# Patient Record
Sex: Male | Born: 1975 | Race: White | Hispanic: No | Smoking: Never smoker
Health system: Southern US, Community
[De-identification: ages and names within clinical notes are randomized; demographics above are authoritative.]

## PROBLEM LIST (undated history)

## (undated) DIAGNOSIS — T4145XA Adverse effect of unspecified anesthetic, initial encounter: Secondary | ICD-10-CM

## (undated) DIAGNOSIS — R0600 Dyspnea, unspecified: Secondary | ICD-10-CM

## (undated) DIAGNOSIS — N200 Calculus of kidney: Secondary | ICD-10-CM

## (undated) DIAGNOSIS — K219 Gastro-esophageal reflux disease without esophagitis: Secondary | ICD-10-CM

## (undated) DIAGNOSIS — T8859XA Other complications of anesthesia, initial encounter: Secondary | ICD-10-CM

## (undated) DIAGNOSIS — E119 Type 2 diabetes mellitus without complications: Secondary | ICD-10-CM

## (undated) DIAGNOSIS — F419 Anxiety disorder, unspecified: Secondary | ICD-10-CM

## (undated) DIAGNOSIS — G4730 Sleep apnea, unspecified: Secondary | ICD-10-CM

## (undated) DIAGNOSIS — Z87442 Personal history of urinary calculi: Secondary | ICD-10-CM

## (undated) HISTORY — PX: FINGER SURGERY: SHX640

## (undated) HISTORY — DX: Calculus of kidney: N20.0

## (undated) HISTORY — DX: Type 2 diabetes mellitus without complications: E11.9

## (undated) HISTORY — DX: Gastro-esophageal reflux disease without esophagitis: K21.9

## (undated) HISTORY — DX: Sleep apnea, unspecified: G47.30

## (undated) HISTORY — PX: ELBOW SURGERY: SHX618

## (undated) HISTORY — PX: TONSILLECTOMY: SUR1361

## (undated) HISTORY — PX: WISDOM TOOTH EXTRACTION: SHX21

---

## 1997-07-26 ENCOUNTER — Ambulatory Visit (HOSPITAL_BASED_OUTPATIENT_CLINIC_OR_DEPARTMENT_OTHER): Admission: RE | Admit: 1997-07-26 | Discharge: 1997-07-26 | Payer: Self-pay | Admitting: Orthopedic Surgery

## 2014-10-04 ENCOUNTER — Ambulatory Visit (INDEPENDENT_AMBULATORY_CARE_PROVIDER_SITE_OTHER): Payer: 59 | Admitting: Physician Assistant

## 2014-10-04 ENCOUNTER — Encounter: Payer: Self-pay | Admitting: Physician Assistant

## 2014-10-04 VITALS — BP 163/95 | HR 74 | Temp 98.0°F | Wt 314.4 lb

## 2014-10-04 DIAGNOSIS — M25561 Pain in right knee: Secondary | ICD-10-CM

## 2014-10-04 DIAGNOSIS — K219 Gastro-esophageal reflux disease without esophagitis: Secondary | ICD-10-CM | POA: Diagnosis not present

## 2014-10-04 MED ORDER — MELOXICAM 15 MG PO TABS: 15.0000 mg | ORAL_TABLET | Freq: Every day | ORAL | Status: DC

## 2014-10-04 MED ORDER — OMEPRAZOLE 40 MG PO CPDR: 40.0000 mg | DELAYED_RELEASE_CAPSULE | Freq: Every day | ORAL | Status: DC

## 2014-10-04 NOTE — Patient Instructions (Signed)

## 2014-10-04 NOTE — Progress Notes (Signed)
Subjective:     Patient ID: Ernest GaussRobert D Miller, male   DOB: 08/28/1975, 39 y.o.   MRN: [id number]  HPI Pt with a week hx of R knee pain States he was moving and carrying something up the steps when he felt a pop to the R knee Denies any locking giving way  Sx worse with standing on all day No prev surgery to knee OTC NSAIDS help sx Pt also needing rf of Prilosec for GERD Review of Systems  Constitutional: Negative.   Gastrointestinal: Negative.   Musculoskeletal: Positive for joint swelling and arthralgias.       Objective:   Physical Exam  Constitutional: He appears well-developed and well-nourished.  Abdominal: Soft. Bowel sounds are normal. He exhibits no distension and no mass. There is no tenderness. There is no rebound and no guarding.  Nursing note and vitals reviewed. No effusion to the L knee + TTP of the distal femoral and post knee + Sx with patellar compression No laxity Lachman/McMurray neg Good strength distal Pulses/sensory good distal     Assessment:     R knee pain GERD    Plan:     Will rf Prilosec  Stressed weight loss and diet for his GERD Mobic 15mg  qd- Hold OTC NSAIDS OTC Neoprene brace  Pt to f/u on BP

## 2014-10-10 ENCOUNTER — Ambulatory Visit: Payer: Self-pay | Admitting: Physician Assistant

## 2014-10-14 ENCOUNTER — Ambulatory Visit: Payer: Self-pay | Admitting: Family Medicine

## 2016-02-24 ENCOUNTER — Encounter: Payer: Self-pay | Admitting: Family Medicine

## 2016-03-02 ENCOUNTER — Encounter: Payer: Self-pay | Admitting: Family Medicine

## 2016-05-11 ENCOUNTER — Ambulatory Visit (INDEPENDENT_AMBULATORY_CARE_PROVIDER_SITE_OTHER): Payer: BLUE CROSS/BLUE SHIELD

## 2016-05-11 ENCOUNTER — Encounter: Payer: Self-pay | Admitting: Family Medicine

## 2016-05-11 ENCOUNTER — Ambulatory Visit (INDEPENDENT_AMBULATORY_CARE_PROVIDER_SITE_OTHER): Payer: BLUE CROSS/BLUE SHIELD | Admitting: Family Medicine

## 2016-05-11 VITALS — BP 152/92 | HR 77 | Temp 98.6°F | Ht 70.0 in | Wt 326.0 lb

## 2016-05-11 DIAGNOSIS — R0602 Shortness of breath: Secondary | ICD-10-CM

## 2016-05-11 DIAGNOSIS — F410 Panic disorder [episodic paroxysmal anxiety] without agoraphobia: Secondary | ICD-10-CM | POA: Diagnosis not present

## 2016-05-11 MED ORDER — ESCITALOPRAM OXALATE 10 MG PO TABS: 10.0000 mg | ORAL_TABLET | Freq: Every day | ORAL | 2 refills | Status: DC

## 2016-05-11 MED ORDER — OMEPRAZOLE 40 MG PO CPDR: 40.0000 mg | DELAYED_RELEASE_CAPSULE | Freq: Every day | ORAL | 3 refills | Status: DC
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
## 2016-05-12 ENCOUNTER — Telehealth: Payer: Self-pay | Admitting: *Deleted

## 2016-05-12 LAB — CMP14+EGFR
ALT: 26 IU/L (ref 0–44)
AST: 22 IU/L (ref 0–40)
Albumin/Globulin Ratio: 1.5 (ref 1.2–2.2)
Albumin: 4.6 g/dL (ref 3.5–5.5)
Alkaline Phosphatase: 73 IU/L (ref 39–117)
BUN/Creatinine Ratio: 15 (ref 9–20)
BUN: 18 mg/dL (ref 6–24)
Bilirubin Total: <0.2 mg/dL (ref 0.0–1.2)
CO2: 25 mmol/L (ref 18–29)
Calcium: 9.4 mg/dL (ref 8.7–10.2)
Chloride: 99 mmol/L (ref 96–106)
Creatinine, Ser: 1.2 mg/dL (ref 0.76–1.27)
GFR calc Af Amer: 86 mL/min/{1.73_m2} (ref >59)
GFR calc non Af Amer: 74 mL/min/{1.73_m2} (ref >59)
Globulin, Total: 3.1 g/dL (ref 1.5–4.5)
Glucose: 82 mg/dL (ref 65–99)
Potassium: 4.9 mmol/L (ref 3.5–5.2)
Sodium: 140 mmol/L (ref 134–144)
Total Protein: 7.7 g/dL (ref 6.0–8.5)

## 2016-05-12 LAB — CBC WITH DIFFERENTIAL/PLATELET
Basophils Absolute: 0.1 10*3/uL (ref 0.0–0.2)
Basos: 1 %
EOS (ABSOLUTE): 0.8 10*3/uL — ABNORMAL HIGH (ref 0.0–0.4)
Eos: 6 %
Hematocrit: 44.2 % (ref 37.5–51.0)
Hemoglobin: 15.6 g/dL (ref 13.0–17.7)
Immature Grans (Abs): 0 10*3/uL (ref 0.0–0.1)
Immature Granulocytes: 0 %
Lymphocytes Absolute: 4 10*3/uL — ABNORMAL HIGH (ref 0.7–3.1)
Lymphs: 31 %
MCH: 30.6 pg (ref 26.6–33.0)
MCHC: 35.3 g/dL (ref 31.5–35.7)
MCV: 87 fL (ref 79–97)
Monocytes Absolute: 1.1 10*3/uL — ABNORMAL HIGH (ref 0.1–0.9)
Monocytes: 8 %
Neutrophils Absolute: 7 10*3/uL (ref 1.4–7.0)
Neutrophils: 54 %
Platelets: 316 10*3/uL (ref 150–379)
RBC: 5.1 x10E6/uL (ref 4.14–5.80)
RDW: 13.8 % (ref 12.3–15.4)
WBC: 12.9 10*3/uL — ABNORMAL HIGH (ref 3.4–10.8)

## 2016-05-12 LAB — BRAIN NATRIURETIC PEPTIDE: BNP: 13 pg/mL (ref 0.0–100.0)

## 2016-05-12 LAB — D-DIMER, QUANTITATIVE: D-DIMER: 0.37 mg/L FEU (ref 0.00–0.49)

## 2016-05-12 LAB — TSH+FREE T4
Free T4: 1.14 ng/dL (ref 0.82–1.77)
TSH: 2.11 u[IU]/mL (ref 0.450–4.500)

## 2016-05-12 NOTE — Progress Notes (Signed)
Your chest x-ray looked normal. Thanks, WS.

## 2016-05-12 NOTE — Telephone Encounter (Signed)
-----   Message from Mechele ClaudeWarren Stacks, MD sent at 05/12/2016  4:15 PM EST ----- Your chest x-ray looked normal. Thanks, WS.

## 2016-05-12 NOTE — Telephone Encounter (Signed)
-----   Message from Mechele ClaudeWarren Stacks, MD sent at 05/12/2016  4:16 PM EST ----- Aniceto BossHello Celedonio, Your blood work showed a mild elevation of your blood count. It's consistent with a mild infection, probably viral. Best Regards, Mechele ClaudeWarren Stacks, M.D.

## 2016-05-12 NOTE — Telephone Encounter (Signed)
Pt notified of results Verbalizes understanding 

## 2016-05-25 ENCOUNTER — Ambulatory Visit (INDEPENDENT_AMBULATORY_CARE_PROVIDER_SITE_OTHER): Payer: BLUE CROSS/BLUE SHIELD | Admitting: Family Medicine

## 2016-05-25 ENCOUNTER — Encounter: Payer: Self-pay | Admitting: Family Medicine

## 2016-05-25 VITALS — BP 143/89 | HR 61 | Temp 97.3°F | Ht 70.0 in | Wt 323.2 lb

## 2016-05-25 DIAGNOSIS — R0602 Shortness of breath: Secondary | ICD-10-CM | POA: Diagnosis not present

## 2016-05-25 DIAGNOSIS — F411 Generalized anxiety disorder: Secondary | ICD-10-CM | POA: Diagnosis not present
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
## 2016-06-17 ENCOUNTER — Other Ambulatory Visit: Payer: Self-pay | Admitting: *Deleted

## 2016-06-17 MED ORDER — ESCITALOPRAM OXALATE 10 MG PO TABS: 10.0000 mg | ORAL_TABLET | Freq: Every day | ORAL | 0 refills | Status: DC

## 2016-08-10 ENCOUNTER — Ambulatory Visit: Payer: BLUE CROSS/BLUE SHIELD | Admitting: Pediatrics

## 2016-08-23 ENCOUNTER — Ambulatory Visit: Payer: BLUE CROSS/BLUE SHIELD | Admitting: Family Medicine

## 2016-08-25 ENCOUNTER — Encounter: Payer: Self-pay | Admitting: Pediatrics

## 2016-08-25 ENCOUNTER — Ambulatory Visit (INDEPENDENT_AMBULATORY_CARE_PROVIDER_SITE_OTHER): Payer: BLUE CROSS/BLUE SHIELD | Admitting: Pediatrics

## 2016-08-25 VITALS — BP 127/77 | HR 75 | Temp 98.1°F | Ht 70.0 in | Wt 319.0 lb

## 2016-08-25 DIAGNOSIS — F411 Generalized anxiety disorder: Secondary | ICD-10-CM

## 2016-08-25 DIAGNOSIS — R1013 Epigastric pain: Secondary | ICD-10-CM | POA: Diagnosis not present

## 2016-08-25 DIAGNOSIS — R0683 Snoring: Secondary | ICD-10-CM

## 2016-08-25 MED ORDER — VENLAFAXINE HCL ER 37.5 MG PO CP24
75.0000 mg | ORAL_CAPSULE | Freq: Every day | ORAL | 3 refills | Status: DC
Start: 2016-08-25 — End: 2016-10-27

## 2016-08-25 NOTE — Progress Notes (Signed)
  Subjective:   Patient ID: Ernest Miller, male    DOB: 03/19/1976, 41 y.o.   MRN: [id number] CC: Follow-up (Lexapro, not working)  HPI: Ernest Miller is a 41 y.o. male presenting for Follow-up (Lexapro, not working)  Started on [employer] 3 months ago Increased to 20mg  one week ago Feels on edge all the time hasnt done anything to hurt anyone Filter is gone  Mood was much better at first when started on [employer] Sleeping ok, wife thinks he is more restless Has been tried on zoloft and prozac Thinks it made him emotional, was crying a lot while on it  Snores loudly Has pauses with breathing Has never been diagnosed with sleep apnea or had eval  8 yrs ago had gallbladder "attack" Emesis some mornings  Says he was told at the time he should have his gallbladder out Didn't follow up, symptoms resolved Taking PPI in the mornings, helps some Symptoms always worse after a spicy meal Has some symptoms a couple days a week   GAD 7 : Generalized Anxiety Score 08/25/2016  Nervous, Anxious, on Edge 3  Control/stop worrying 0  Worry too much - different things 0  Trouble relaxing 3  Restless 1  Easily annoyed or irritable 3  Afraid - awful might happen 0  Total GAD 7 Score 10  Anxiety Difficulty Somewhat difficult     Relevant past medical, surgical, family and social history reviewed. Allergies and medications reviewed and updated. History  Smoking Status  . Never Smoker  Smokeless Tobacco  . Never Used   ROS: Per HPI   Objective:    BP 127/77   Pulse 75   Temp 98.1 F (36.7 C) (Oral)   Ht 5\' 10"  (1.778 m)   Wt (!) 319 lb (144.7 kg)   BMI 45.77 kg/m   Wt Readings from Last 3 Encounters:  08/25/16 (!) 319 lb (144.7 kg)  05/25/16 (!) 323 lb 3.2 oz (146.6 kg)  05/11/16 (!) 326 lb (147.9 kg)    Gen: NAD, alert, cooperative with exam, NCAT EYES: EOMI, no conjunctival injection, or no icterus ENT:  TMs pearly gray b/l, OP without erythema, +crowding LYMPH: no cervical  LAD CV: NRRR, normal S1/S2, no murmur, distal pulses 2+ b/l Resp: CTABL, no wheezes, normal WOB Abd: +BS, soft, mildly tender epigastric area with palpation, ND. Obese, no guarding or rebound Ext: No edema, warm Neuro: Alert and oriented, strength equal b/l UE and LE, coordination grossly normal MSK: normal muscle bulk Psych: nl affect, no thoughts of self harm  Assessment & Plan:  Ernest Miller was seen today for follow-up multiple med problems.  Diagnoses and all orders for this visit:  GAD (generalized anxiety disorder) Ongoing symptoms, worsened with lexapro. taper off of lexapro, start below -     venlafaxine XR (EFFEXOR-XR) 37.5 MG 24 hr capsule; Take 2 capsules (75 mg total) by mouth daily with breakfast.  Epigastric pain On going off and on for months H/o gall stones, no RUQ pain, some epigastric tenderness with palpation Normal LFTs 3 mo ago Start with u/s, increase PPI to BID Avoid exacerbating foods -     US Abdomen Limited RUQ; Future  Snoring Pauses with breathing, daytime fatigue Refer to sleep apnea eval -     Ambulatory referral to Neurology   Follow up plan: Return in about 2 months (around 10/25/2016). Ernest Krasarol Jalila Goodnough, MD [facility]

## 2016-09-03 ENCOUNTER — Ambulatory Visit (HOSPITAL_COMMUNITY): Payer: Self-pay

## 2016-09-13 ENCOUNTER — Ambulatory Visit (HOSPITAL_COMMUNITY): Admission: RE | Admit: 2016-09-13 | Payer: BLUE CROSS/BLUE SHIELD | Source: Ambulatory Visit

## 2016-10-07 ENCOUNTER — Institutional Professional Consult (permissible substitution): Payer: BLUE CROSS/BLUE SHIELD | Admitting: Neurology

## 2016-10-12 ENCOUNTER — Ambulatory Visit (HOSPITAL_COMMUNITY): Payer: Self-pay

## 2016-10-21 ENCOUNTER — Other Ambulatory Visit: Payer: Self-pay | Admitting: Family Medicine

## 2016-10-27 ENCOUNTER — Encounter: Payer: Self-pay | Admitting: Pediatrics

## 2016-10-27 ENCOUNTER — Ambulatory Visit (INDEPENDENT_AMBULATORY_CARE_PROVIDER_SITE_OTHER): Payer: BLUE CROSS/BLUE SHIELD | Admitting: Pediatrics

## 2016-10-27 VITALS — BP 138/89 | HR 72 | Temp 98.0°F | Ht 70.0 in | Wt 325.0 lb

## 2016-10-27 DIAGNOSIS — F419 Anxiety disorder, unspecified: Secondary | ICD-10-CM

## 2016-10-27 DIAGNOSIS — R0683 Snoring: Secondary | ICD-10-CM | POA: Diagnosis not present

## 2016-10-27 DIAGNOSIS — K219 Gastro-esophageal reflux disease without esophagitis: Secondary | ICD-10-CM

## 2016-10-27 DIAGNOSIS — Z6841 Body Mass Index (BMI) 40.0 and over, adult: Secondary | ICD-10-CM | POA: Diagnosis not present

## 2016-10-27 MED ORDER — ESCITALOPRAM OXALATE 20 MG PO TABS: 20.0000 mg | ORAL_TABLET | Freq: Every day | ORAL | 1 refills | Status: DC

## 2016-10-27 NOTE — Progress Notes (Signed)
  Subjective:   Patient ID: Ernest Miller, male    DOB: 11/08/1975, 41 y.o.   MRN: [id number] CC: Follow-up (2 month) med problems HPI: Ernest Miller is a 41 y.o. male presenting for Follow-up (2 month)  GAD: tried venlafaxine, was not able to sleep Tried for about a week Went back to lexapro 10mg  Anxiety is doing ok  Rash: started about a week ago after being outside and doing yard work in shorts On legs mostly, also UE, a few on his back A couple of new places past couple of days Very itchy  Skin tags: wants to have removed Have had frozen in the past, in inner thighs  Still with some diarrhea after eating Was not able to afford the copay  Was scheduled to have eval for sleep apnea, wanted $400 copay, not able to do at this time  No chest pain, no SOB  Relevant past medical, surgical, family and social history reviewed. Allergies and medications reviewed and updated. History  Smoking Status  . Never Smoker  Smokeless Tobacco  . Never Used   ROS: Per HPI   Objective:    BP 138/89   Pulse 72   Temp 98 F (36.7 C) (Oral)   Ht 5\' 10"  (1.778 m)   Wt (!) 325 lb (147.4 kg)   BMI 46.63 kg/m   Wt Readings from Last 3 Encounters:  10/27/16 (!) 325 lb (147.4 kg)  08/25/16 (!) 319 lb (144.7 kg)  05/25/16 (!) 323 lb 3.2 oz (146.6 kg)    Gen: NAD, alert, cooperative with exam, NCAT EYES: EOMI, no conjunctival injection, or no icterus ENT:   no cervical LAD CV: NRRR, normal S1/S2, no murmur, distal pulses 2+ b/l Resp: CTABL, no wheezes, normal WOB Abd: +BS, soft, NTND. no guarding or organomegaly Ext: No edema, warm Neuro: Alert and oriented MSK: normal muscle bulk Skin: lower legs, trunk, with a few scattered red papules < 2mm, with excoriations No rash on hands  Assessment & Plan:  Ernest Miller was seen today for follow-up multiple med problems  Diagnoses and all orders for this visit:  Anxiety Ongoing symptoms Cont lexapro, increase to 20mg  -     escitalopram  (LEXAPRO) 20 MG tablet; Take 1 tablet (20 mg total) by mouth daily.  Snoring Not able to afford sleep eval now Will cont to work on weight loss  Gastroesophageal reflux disease, esophagitis presence not specified Cont omeprazole Avoid exacerbating foods  BMI 45.0-49.9, adult (HCC) Discussed lifestyle changes, inc physical activity, decrease fast food intake  Follow up plan: Return in about 3 months (around 01/27/2017) for med follow up. Rex Krasarol Arpita Fentress, MD [facility]

## 2016-11-03 ENCOUNTER — Ambulatory Visit: Payer: BLUE CROSS/BLUE SHIELD | Admitting: Pediatrics

## 2016-11-05 ENCOUNTER — Ambulatory Visit: Payer: BLUE CROSS/BLUE SHIELD | Admitting: Pediatrics

## 2017-01-27 ENCOUNTER — Encounter: Payer: Self-pay | Admitting: Pediatrics

## 2017-01-27 ENCOUNTER — Ambulatory Visit (INDEPENDENT_AMBULATORY_CARE_PROVIDER_SITE_OTHER): Payer: BLUE CROSS/BLUE SHIELD | Admitting: Pediatrics

## 2017-01-27 VITALS — BP 137/88 | HR 76 | Temp 97.9°F | Ht 70.0 in | Wt 334.4 lb

## 2017-01-27 DIAGNOSIS — R0683 Snoring: Secondary | ICD-10-CM | POA: Diagnosis not present

## 2017-01-27 DIAGNOSIS — Z131 Encounter for screening for diabetes mellitus: Secondary | ICD-10-CM

## 2017-01-27 DIAGNOSIS — R030 Elevated blood-pressure reading, without diagnosis of hypertension: Secondary | ICD-10-CM | POA: Diagnosis not present

## 2017-01-27 DIAGNOSIS — Z13220 Encounter for screening for lipoid disorders: Secondary | ICD-10-CM

## 2017-01-27 DIAGNOSIS — E119 Type 2 diabetes mellitus without complications: Secondary | ICD-10-CM | POA: Diagnosis not present

## 2017-01-27 DIAGNOSIS — F419 Anxiety disorder, unspecified: Secondary | ICD-10-CM

## 2017-01-27 DIAGNOSIS — Z6841 Body Mass Index (BMI) 40.0 and over, adult: Secondary | ICD-10-CM

## 2017-01-27 LAB — BAYER DCA HB A1C WAIVED: HB A1C (BAYER DCA - WAIVED): 6.5 % (ref <7.0)

## 2017-01-27 MED ORDER — ESCITALOPRAM OXALATE 20 MG PO TABS: 20.0000 mg | ORAL_TABLET | Freq: Every day | ORAL | 1 refills | Status: DC

## 2017-01-27 NOTE — Progress Notes (Signed)
  Subjective:   Patient ID: Ernest Miller, male    DOB: May 05, 1975, 41 y.o.   MRN: [id number] CC: Follow-up (3 month) multiple med problems  HPI: Ernest Miller is a 41 y.o. male presenting for Follow-up (3 month)  Anxiety: lexapro has been working, says mood is ok as well  Arthritis: pain in his R knee when he has been on it for periods of time Up and down bending at work a lot  Elevated BMI: not eating after 7pm Drinking one soda a day Drinks 16oz of milk a day  Snoring: not yet evaluated for sleep apnea   Relevant past medical, surgical, family and social history reviewed. Allergies and medications reviewed and updated. History  Smoking Status  . Never Smoker  Smokeless Tobacco  . Never Used   ROS: Per HPI   Objective:    BP 137/88   Pulse 76   Temp 97.9 F (36.6 C) (Oral)   Ht '5\' 10"'$  (1.778 m)   Wt (!) 334 lb 6.4 oz (151.7 kg)   BMI 47.98 kg/m   Wt Readings from Last 3 Encounters:  01/27/17 (!) 334 lb 6.4 oz (151.7 kg)  10/27/16 (!) 325 lb (147.4 kg)  08/25/16 (!) 319 lb (144.7 kg)    Gen: NAD, alert, obese, cooperative with exam, NCAT EYES: EOMI, no conjunctival injection, or no icterus ENT:   OP without erythema LYMPH: no cervical LAD CV: NRRR, normal S1/S2, no murmur, distal pulses 2+ b/l Resp: CTABL, no wheezes, normal WOB Abd: +BS, soft, NTND. Ext: No edema, warm Neuro: Alert and oriented  Assessment & Plan:  Anders was seen today for follow-up med problems  Diagnoses and all orders for this visit:  Snoring refer for sleep apnea -     Ambulatory referral to Cardiology  Anxiety Symptoms improved with below, mood he says is fine, continue below -     escitalopram (LEXAPRO) 20 MG tablet; Take 1 tablet (20 mg total) by mouth daily.  Screening for diabetes mellitus -     Bayer DCA Hb A1c Waived  Screening for hyperlipidemia -     Lipid panel  Elevated blood pressure reading -     CMP14+EGFR  BMI 45.0-49.9, adult (HCC) Decrease sugar  intake  Diabetes mellitus without complication (HCC) O8L 6.5, start metformin, cont diet, physical activity and wt loss discussion -     metFORMIN (GLUCOPHAGE) 500 MG tablet; Take 1 tablet (500 mg total) by mouth 2 (two) times daily with a meal.   Follow up plan: Return in about 3 months (around 04/29/2017). Assunta Found, MD [place]

## 2017-01-28 LAB — CMP14+EGFR
ALT: 29 IU/L (ref 0–44)
AST: 24 IU/L (ref 0–40)
Albumin/Globulin Ratio: 1.6 (ref 1.2–2.2)
Albumin: 4.6 g/dL (ref 3.5–5.5)
Alkaline Phosphatase: 72 IU/L (ref 39–117)
BUN/Creatinine Ratio: 18 (ref 9–20)
BUN: 20 mg/dL (ref 6–24)
Bilirubin Total: 0.2 mg/dL (ref 0.0–1.2)
CO2: 27 mmol/L (ref 20–29)
Calcium: 9.9 mg/dL (ref 8.7–10.2)
Chloride: 101 mmol/L (ref 96–106)
Creatinine, Ser: 1.11 mg/dL (ref 0.76–1.27)
GFR calc Af Amer: 94 mL/min/{1.73_m2} (ref >59)
GFR calc non Af Amer: 81 mL/min/{1.73_m2} (ref >59)
Globulin, Total: 2.9 g/dL (ref 1.5–4.5)
Glucose: 121 mg/dL — ABNORMAL HIGH (ref 65–99)
Potassium: 5.4 mmol/L — ABNORMAL HIGH (ref 3.5–5.2)
Sodium: 142 mmol/L (ref 134–144)
Total Protein: 7.5 g/dL (ref 6.0–8.5)

## 2017-01-28 LAB — LIPID PANEL
Chol/HDL Ratio: 6.2 ratio — ABNORMAL HIGH (ref 0.0–5.0)
Cholesterol, Total: 204 mg/dL — ABNORMAL HIGH (ref 100–199)
HDL: 33 mg/dL — ABNORMAL LOW (ref >39)
LDL Calculated: 130 mg/dL — ABNORMAL HIGH (ref 0–99)
Triglycerides: 205 mg/dL — ABNORMAL HIGH (ref 0–149)
VLDL Cholesterol Cal: 41 mg/dL — ABNORMAL HIGH (ref 5–40)

## 2017-01-29 MED ORDER — METFORMIN HCL 500 MG PO TABS: 500.0000 mg | ORAL_TABLET | Freq: Two times a day (BID) | ORAL | 3 refills | Status: DC

## 2017-01-31 ENCOUNTER — Telehealth: Payer: Self-pay | Admitting: *Deleted

## 2017-01-31 MED ORDER — PRAVASTATIN SODIUM 40 MG PO TABS: 40.0000 mg | ORAL_TABLET | Freq: Every day | ORAL | 3 refills | Status: DC

## 2017-01-31 NOTE — Telephone Encounter (Signed)
Patient's wife  aware of patient's lab results.  A1c  in prediabetic range and cholesterol is elevated.  Metformin 500 mg sent to pharmacy, Patient is to take 1/2 tablet twice daily.  Pravastatin 40 mg also sent to pharmacy and patient is to take 1 at night.  Per Dr. Oswaldo DoneVincent.  2 week f/u appt made

## 2017-02-16 ENCOUNTER — Ambulatory Visit: Payer: BLUE CROSS/BLUE SHIELD | Admitting: Pediatrics

## 2017-02-16 ENCOUNTER — Encounter: Payer: Self-pay | Admitting: Pediatrics

## 2017-02-16 VITALS — BP 137/89 | HR 82 | Temp 97.2°F | Ht 70.0 in | Wt 336.0 lb

## 2017-02-16 DIAGNOSIS — Z6841 Body Mass Index (BMI) 40.0 and over, adult: Secondary | ICD-10-CM

## 2017-02-16 DIAGNOSIS — E785 Hyperlipidemia, unspecified: Secondary | ICD-10-CM

## 2017-02-16 DIAGNOSIS — E119 Type 2 diabetes mellitus without complications: Secondary | ICD-10-CM | POA: Diagnosis not present

## 2017-02-16 DIAGNOSIS — E1169 Type 2 diabetes mellitus with other specified complication: Secondary | ICD-10-CM

## 2017-02-16 NOTE — Progress Notes (Signed)
  Subjective:   Patient ID: Jena GaussRobert D Vanduyn, male    DOB: 12/31/1975, 41 y.o.   MRN: [id number] CC: Follow-up (2 week) Diabetes, new diagnosis HPI: Jena GaussRobert D Aarons is a 41 y.o. 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 Allergies and medications reviewed and updated. Social History   Tobacco Use  Smoking Status Never Smoker  Smokeless Tobacco Never Used   ROS: Per HPI   Objective:    BP 137/89   Pulse 82   Temp (!) 97.2 F (36.2 C) (Oral)   Ht 5\' 10"  (1.778 m)   Wt (!) 336 lb (152.4 kg)   BMI 48.21 kg/m   Wt Readings from Last 3 Encounters:  02/16/17 (!) 336 lb (152.4 kg)  01/27/17 (!) 334 lb 6.4 oz (151.7 kg)  10/27/16 (!) 325 lb (147.4 kg)    Gen: NAD, alert, cooperative with exam, NCAT EYES: EOMI, no conjunctival injection, or no icterus ENT:  MMM CV: NRRR, normal S1/S2, no murmur, distal pulses 2+ b/l Resp: CTABL, no wheezes, normal WOB Abd: +BS, soft, NTND. Ext: No edema, warm Neuro: Alert and oriented, strength equal b/l UE and LE, coordination grossly normal, sensation intact bilateral feet  Assessment & Plan:  [name] was seen today for follow-up diagnosis diabetes  Diagnoses and all orders for this visit:  Diabetes mellitus without complication (HCC) A1c 6.5 Start metformin Discussed dietary changes,  stop frosting, sodas Increase fruit and vegetable intake Increase physical activity Due for eye exam, patient aware -     Microalbumin / creatinine urine ratio  Hyperlipidemia associated with type 2 diabetes mellitus (HCC) Stable, on statin tolerating well  Class 3 severe obesity due to excess calories with serious comorbidity and body mass index (BMI) of 45.0 to 49.9 in adult (HCC) Goal of 5 pounds decrease over the next 3 months Lifestyle changes as above  Hypertension Blood pressure remains slightly elevated Patient wants to wait to see what blood pressure does over the next 3 months   Follow up plan: 3 months [name], MD [facility]

## 2017-02-17 LAB — MICROALBUMIN / CREATININE URINE RATIO
Creatinine, Urine: 99.8 mg/dL
Microalb/Creat Ratio: 5.9 mg/g creat (ref 0.0–30.0)
Microalbumin, Urine: 5.9 ug/mL

## 2017-03-03 ENCOUNTER — Institutional Professional Consult (permissible substitution): Payer: BLUE CROSS/BLUE SHIELD | Admitting: Neurology

## 2017-05-08 ENCOUNTER — Other Ambulatory Visit: Payer: Self-pay | Admitting: Family Medicine

## 2017-05-19 ENCOUNTER — Ambulatory Visit: Payer: BLUE CROSS/BLUE SHIELD | Admitting: Pediatrics

## 2017-05-19 ENCOUNTER — Encounter: Payer: Self-pay | Admitting: Pediatrics

## 2017-05-19 VITALS — BP 130/82 | HR 73 | Temp 96.9°F | Ht 70.0 in | Wt 325.0 lb

## 2017-05-19 DIAGNOSIS — E119 Type 2 diabetes mellitus without complications: Secondary | ICD-10-CM

## 2017-05-19 DIAGNOSIS — J069 Acute upper respiratory infection, unspecified: Secondary | ICD-10-CM | POA: Diagnosis not present

## 2017-05-19 DIAGNOSIS — K1379 Other lesions of oral mucosa: Secondary | ICD-10-CM

## 2017-05-19 DIAGNOSIS — Z6841 Body Mass Index (BMI) 40.0 and over, adult: Secondary | ICD-10-CM

## 2017-05-19 DIAGNOSIS — F411 Generalized anxiety disorder: Secondary | ICD-10-CM | POA: Diagnosis not present

## 2017-05-19 LAB — BAYER DCA HB A1C WAIVED: HB A1C (BAYER DCA - WAIVED): 6.1 % (ref <7.0)

## 2017-05-19 NOTE — Progress Notes (Signed)
  Subjective:   Patient ID: Ernest Miller, male    DOB: 07/03/1975, 42 y.o.   MRN: [id number] CC: Follow-up (3 month); Facial Pain; and Nasal Congestion  HPI: Ernest Miller is a 42 y.o. male presenting for Follow-up (3 month); Facial Pain; and Nasal Congestion  Elevated BMI: moving toward keto diet at home  Has had persistent sore L side of his tongue that has been there for months. Non-healing. Never used chewing tobacco, non-smoker  Snoring: Refer to sleep study, has not set up appointment yet.  Has had cough, runny nose, congestion for the last 2 days.  No fevers.  Appetite is been fine.  No shortness of breath or chest pain.  No sore throat.  Runny nose and congestion bothering him the most.  Minimal coughing.  Has not been taking anything for it at home yet.  Relevant past medical, surgical, family and social history reviewed. Allergies and medications reviewed and updated. Social History   Tobacco Use  Smoking Status Never Smoker  Smokeless Tobacco Never Used   ROS: Per HPI   Objective:    BP 130/82   Pulse 73   Temp (!) 96.9 F (36.1 C) (Oral)   Ht 5\' 10"  (1.778 m)   Wt (!) 325 lb (147.4 kg)   BMI 46.63 kg/m   Wt Readings from Last 3 Encounters:  05/19/17 (!) 325 lb (147.4 kg)  02/16/17 (!) 336 lb (152.4 kg)  01/27/17 (!) 334 lb 6.4 oz (151.7 kg)    Gen: NAD, alert, cooperative with exam, NCAT, congested EYES: EOMI, no conjunctival injection, or no icterus ENT:  TMs pearly gray b/l, OP without erythema, left side of tongue with well-defined irregular border weight-based slight ulceration with minimal erythema surrounding the ulcer.  Approximately 3mm. LYMPH: no cervical LAD CV: NRRR, normal S1/S2, no murmur, distal pulses 2+ b/l Resp: CTABL, no wheezes, normal WOB Abd: +BS, soft, NTND. no guarding or organomegaly Ext: No edema, warm Neuro: Alert and oriented, strength equal b/l UE and LE, coordination grossly normal MSK: normal muscle bulk  Assessment & Plan:    Ernest Miller was seen today for follow-up, facial pain and nasal congestion.  Diagnoses and all orders for this visit:  GAD (generalized anxiety disorder) Stable, continue current medicine.  Diabetes mellitus without complication (HCC) A1c down to 6.1.  Continue lifestyle changes.  Planning to come more active with the warming weather this spring. -     Bayer DCA Hb A1c Waived  Mouth sore -     Ambulatory referral to ENT  Acute URI Discussed symptomatic care, return precautions  BMI 45.0-49.9, adult (HCC) Continue avoiding carbohydrates, sugar.  Decrease sun drop intake.  Follow up plan: Return in about 3 months (around 08/16/2017). Ernest Krasarol Vincent, MD [facility]

## 2017-08-08 ENCOUNTER — Other Ambulatory Visit: Payer: Self-pay | Admitting: Family Medicine

## 2017-08-18 ENCOUNTER — Encounter: Payer: Self-pay | Admitting: Pediatrics

## 2017-08-18 ENCOUNTER — Ambulatory Visit: Payer: BLUE CROSS/BLUE SHIELD | Admitting: Pediatrics

## 2017-08-18 VITALS — BP 135/88 | HR 68 | Temp 97.7°F | Ht 70.0 in | Wt 318.2 lb

## 2017-08-18 DIAGNOSIS — R109 Unspecified abdominal pain: Secondary | ICD-10-CM

## 2017-08-18 DIAGNOSIS — F419 Anxiety disorder, unspecified: Secondary | ICD-10-CM | POA: Diagnosis not present

## 2017-08-18 DIAGNOSIS — K219 Gastro-esophageal reflux disease without esophagitis: Secondary | ICD-10-CM

## 2017-08-18 DIAGNOSIS — E119 Type 2 diabetes mellitus without complications: Secondary | ICD-10-CM

## 2017-08-18 LAB — URINALYSIS, COMPLETE
Bilirubin, UA: NEGATIVE
Glucose, UA: NEGATIVE
Ketones, UA: NEGATIVE
Leukocytes, UA: NEGATIVE
Nitrite, UA: NEGATIVE
Specific Gravity, UA: 1.02 (ref 1.005–1.030)
Urobilinogen, Ur: 0.2 mg/dL (ref 0.2–1.0)
pH, UA: 5.5 (ref 5.0–7.5)

## 2017-08-18 LAB — MICROSCOPIC EXAMINATION
Bacteria, UA: NONE SEEN
Renal Epithel, UA: NONE SEEN /hpf

## 2017-08-18 LAB — BAYER DCA HB A1C WAIVED: HB A1C (BAYER DCA - WAIVED): 6.1 % (ref <7.0)

## 2017-08-18 MED ORDER — ESCITALOPRAM OXALATE 20 MG PO TABS: 20.0000 mg | ORAL_TABLET | Freq: Every day | ORAL | 1 refills | Status: DC

## 2017-08-18 MED ORDER — OMEPRAZOLE 40 MG PO CPDR: 40.0000 mg | DELAYED_RELEASE_CAPSULE | Freq: Every day | ORAL | 1 refills | Status: DC

## 2017-08-18 NOTE — Addendum Note (Signed)
Addended by: Caryl Bis on: 08/18/2017 09:43 AM   Modules accepted: Orders

## 2017-08-18 NOTE — Progress Notes (Signed)
  Subjective:   Patient ID: Ernest Miller, male    DOB: 1976/01/15, 42 y.o.   MRN: [id number] CC: Medical Management of Chronic Issues  HPI: Ernest Miller is a 42 y.o. male   Nausea: comes and goes, can be in the morning or evening. Throws up with potato salad, steak often. Spaghetti-Os don't make him sick, does not make him sick. Says he was told he should have his gallbladder removed about 11 years ago but never had it done. Doesn't remember if he had gallstones.    Diabetes and elevated BMI: Drinking about 20 ounces per day of sun drop.  Gets nauseous.  Not previously trying to lose weight right has been tried cutting back on sodas.    H/o kidney stones requiring basket extraction.  Has pain in his left flank, comes out of the blue, feels like a knife stabbing, lasts for several minutes better.  Anxiety: Stable on Lexapro.  Relevant past medical, surgical, family and social history reviewed. Allergies and medications reviewed and updated. Social History   Tobacco Use  Smoking Status Never Smoker  Smokeless Tobacco Never Used   ROS: Per HPI   Objective:    BP 135/88   Pulse 68   Temp 97.7 F (36.5 C) (Oral)   Ht _0  (1.778 m)   Wt (!) 318 lb 3.2 oz (144.3 kg)   BMI 45.66 kg/m   Wt Readings from Last 3 Encounters:  08/18/17 (!) 318 lb 3.2 oz (144.3 kg)  05/19/17 (!) 325 lb (147.4 kg)  02/16/17 (!) 336 lb (152.4 kg)    Gen: NAD, alert, cooperative with exam, NCAT EYES: EOMI, no conjunctival injection, or no icterus CV: NRRR, normal S1/S2, no murmur, distal pulses 2+ b/l Resp: CTABL, no wheezes, normal WOB Abd: +BS, soft, mildly tender throughout with deep palpation, negative Murphy sign.  Mildly distended abdomen.  no guarding. Ext: No edema, warm Neuro: Alert and oriented MSK: normal muscle bulk  Assessment & Plan:  Orvill was seen today for medical management of chronic issues.  Diagnoses and all orders for this visit:  Diabetes mellitus without complication  (HCC)  Q3D 6.1.  Continue to encourage decrease soda intake. -     Bayer DCA Hb A1c Waived  Anxiety Stable, continue below -     escitalopram (LEXAPRO) 20 MG tablet; Take 1 tablet (20 mg total) by mouth daily.  Abdominal pain, unspecified abdominal location Left flank pain, comes and goes out of the blue.  History of kidney stones.  Also getting nauseous, having some periumbilical pain associated with it.  Continue current treatment with PPI.  Will get labs.  Start a CT scan to eval for stones.  Never had right upper quadrant ultrasound ordered last year.  May need further gallbladder evaluation.  No fevers.  Return precautions discussed. -     CMP14+EGFR -     CBC with Differential -     Lipase -     Urinalysis, Complete -     CT RENAL STONE STUDY; Future  Gastroesophageal reflux disease, esophagitis presence not specified -     omeprazole (PRILOSEC) 40 MG capsule; Take 1 capsule (40 mg total) by mouth daily.   Follow up plan: Return in about 1 month (around 09/15/2017). Assunta Found, MD [place]

## 2017-08-19 LAB — LIPASE: Lipase: 19 U/L (ref 13–78)

## 2017-08-19 LAB — CBC WITH DIFFERENTIAL/PLATELET
Basophils Absolute: 0.1 10*3/uL (ref 0.0–0.2)
Basos: 1 %
EOS (ABSOLUTE): 0.8 10*3/uL — ABNORMAL HIGH (ref 0.0–0.4)
Eos: 8 %
Hematocrit: 46.2 % (ref 37.5–51.0)
Hemoglobin: 16.1 g/dL (ref 13.0–17.7)
Immature Grans (Abs): 0 10*3/uL (ref 0.0–0.1)
Immature Granulocytes: 0 %
Lymphocytes Absolute: 2 10*3/uL (ref 0.7–3.1)
Lymphs: 20 %
MCH: 30.7 pg (ref 26.6–33.0)
MCHC: 34.8 g/dL (ref 31.5–35.7)
MCV: 88 fL (ref 79–97)
Monocytes Absolute: 0.7 10*3/uL (ref 0.1–0.9)
Monocytes: 7 %
Neutrophils Absolute: 6.5 10*3/uL (ref 1.4–7.0)
Neutrophils: 64 %
Platelets: 311 10*3/uL (ref 150–379)
RBC: 5.25 x10E6/uL (ref 4.14–5.80)
RDW: 13.9 % (ref 12.3–15.4)
WBC: 10.1 10*3/uL (ref 3.4–10.8)

## 2017-08-19 LAB — CMP14+EGFR
ALT: 26 IU/L (ref 0–44)
AST: 22 IU/L (ref 0–40)
Albumin/Globulin Ratio: 1.5 (ref 1.2–2.2)
Albumin: 4.7 g/dL (ref 3.5–5.5)
Alkaline Phosphatase: 74 IU/L (ref 39–117)
BUN/Creatinine Ratio: 17 (ref 9–20)
BUN: 21 mg/dL (ref 6–24)
Bilirubin Total: 0.3 mg/dL (ref 0.0–1.2)
CO2: 23 mmol/L (ref 20–29)
Calcium: 10.3 mg/dL — ABNORMAL HIGH (ref 8.7–10.2)
Chloride: 100 mmol/L (ref 96–106)
Creatinine, Ser: 1.23 mg/dL (ref 0.76–1.27)
GFR calc Af Amer: 83 mL/min/{1.73_m2} (ref >59)
GFR calc non Af Amer: 71 mL/min/{1.73_m2} (ref >59)
Globulin, Total: 3.1 g/dL (ref 1.5–4.5)
Glucose: 129 mg/dL — ABNORMAL HIGH (ref 65–99)
Potassium: 4.7 mmol/L (ref 3.5–5.2)
Sodium: 140 mmol/L (ref 134–144)
Total Protein: 7.8 g/dL (ref 6.0–8.5)

## 2017-09-19 ENCOUNTER — Ambulatory Visit: Payer: BLUE CROSS/BLUE SHIELD | Admitting: Pediatrics

## 2017-09-19 ENCOUNTER — Encounter: Payer: Self-pay | Admitting: Pediatrics

## 2017-09-19 ENCOUNTER — Ambulatory Visit (INDEPENDENT_AMBULATORY_CARE_PROVIDER_SITE_OTHER): Payer: BLUE CROSS/BLUE SHIELD

## 2017-09-19 VITALS — BP 134/96 | HR 63 | Temp 97.1°F | Ht 70.0 in | Wt 300.0 lb

## 2017-09-19 DIAGNOSIS — R1012 Left upper quadrant pain: Secondary | ICD-10-CM | POA: Diagnosis not present

## 2017-09-19 DIAGNOSIS — R109 Unspecified abdominal pain: Secondary | ICD-10-CM

## 2017-09-19 LAB — URINALYSIS, COMPLETE
Bilirubin, UA: NEGATIVE
Glucose, UA: NEGATIVE
Ketones, UA: NEGATIVE
Leukocytes, UA: NEGATIVE
Nitrite, UA: NEGATIVE
Protein, UA: NEGATIVE
RBC, UA: NEGATIVE
Specific Gravity, UA: 1.02 (ref 1.005–1.030)
Urobilinogen, Ur: 0.2 mg/dL (ref 0.2–1.0)
pH, UA: 6 (ref 5.0–7.5)

## 2017-09-19 LAB — MICROSCOPIC EXAMINATION
Bacteria, UA: NONE SEEN
Epithelial Cells (non renal): NONE SEEN /hpf (ref 0–10)
Renal Epithel, UA: NONE SEEN /hpf
WBC, UA: NONE SEEN /hpf (ref 0–5)

## 2017-09-19 NOTE — Addendum Note (Signed)
Addended by: Johna SheriffVINCENT, Garyn Arlotta L on: 09/19/2017 09:19 AM   Modules accepted: Orders

## 2017-09-19 NOTE — Progress Notes (Signed)
  Subjective:   Patient ID: Ernest Miller, male    DOB: 10/11/1975, 42 y.o.   MRN: [id number] CC: Abdominal Pain (1 month follow up)  HPI: Ernest GaussRobert D Bally is a 42 y.o. male   Flank pain: ongoing. Pain inconsistent, comes and goes out of the blue. Gets nauseated with the pain. No constipation, regularly stooling, no blood in stools. Pain feels like a knife in side twisting, lasting a few minutes to 30-40 minutes. Has not passed any stones. Has h/o of renal stones, last passed a stone about 4 years ago.  Thinks he passed a stone about 2 weeks ago.  Relieved pain briefly, then returned.  No fevers.  Normal appetite.  Relevant past medical, surgical, family and social history reviewed. Allergies and medications reviewed and updated. Social History   Tobacco Use  Smoking Status Never Smoker  Smokeless Tobacco Never Used   ROS: Per HPI   Objective:    BP (!) 152/97   Pulse 60   Temp (!) 97.1 F (36.2 C) (Oral)   Ht 5\' 10"  (1.778 m)   Wt 300 lb (136.1 kg)   BMI 43.05 kg/m   Wt Readings from Last 3 Encounters:  09/19/17 300 lb (136.1 kg)  08/18/17 (!) 318 lb 3.2 oz (144.3 kg)  05/19/17 (!) 325 lb (147.4 kg)    Gen: NAD, alert, cooperative with exam, NCAT EYES: EOMI, no conjunctival injection, or no icterus CV: NRRR, normal S1/S2, no murmur, distal pulses 2+ b/l Resp: CTABL, no wheezes, normal WOB Abd: +BS, soft, NT, mildly distended.  No CVA tenderness. Ext: No edema, warm Neuro: Alert and oriented  Assessment & Plan:  [name] was seen today for abdominal pain.  Diagnoses and all orders for this visit:  Left flank pain CT scan denied after last visit by insurance because they wanted a KUB first.  Will order KUB. -     DG Abd 1 View; Future -     Ambulatory referral to Urology  Left upper quadrant pain -     CT RENAL STONE STUDY; Future   Follow up plan: Return in about 1 month (around 10/17/2017). [name], MD [facility]

## 2017-09-27 ENCOUNTER — Telehealth: Payer: Self-pay | Admitting: Pediatrics

## 2017-09-27 NOTE — Telephone Encounter (Signed)
Returned patient's phone call.  Patient states that he is in severe pain with no relief from ibuprofen 800mg .  He states he is having back pain, abdominal pain and nausea.  Referral has been placed and faxed to [facility].  Patient is waiting for an appt with urology

## 2017-09-27 NOTE — Telephone Encounter (Signed)
Shanda BumpsJessica is wanting to speak to chanda about a referral about kidney stone, and she states that pt has called several times about the referral and has not heard anything. Shanda BumpsJessica states the pt is in a lot of pain and wants to speak to chanda about if she knows anything about the referral or if he can get some medication to help with the pain

## 2017-09-28 NOTE — Telephone Encounter (Signed)
Appt made

## 2017-09-28 NOTE — Telephone Encounter (Signed)
Pools--Referral placed and CT scan ordered on 6/17, sometimes it takes some time to get into urology. We will see if we can get CT scan done sooner. Is in review with his insurance.  Debbie/Carlon--Can you follow up on CT scan? Initially declined bc didn't have other imaging first. He had a KUB with nephrolithiasis on 6/17.

## 2017-09-28 NOTE — Telephone Encounter (Signed)
Has to be seen for anything stronger, can add to my schedule for tomorrow?

## 2017-09-28 NOTE — Telephone Encounter (Signed)
Patient aware and states that the pain is unbearable and is wanting to know if there is anything other than Ibuprofen 800mg  that he can take for relief

## 2017-09-29 ENCOUNTER — Encounter: Payer: Self-pay | Admitting: Pediatrics

## 2017-09-29 ENCOUNTER — Ambulatory Visit: Payer: BLUE CROSS/BLUE SHIELD | Admitting: Pediatrics

## 2017-09-29 ENCOUNTER — Telehealth: Payer: Self-pay

## 2017-09-29 VITALS — BP 139/89 | HR 64 | Temp 97.7°F | Ht 70.0 in | Wt 323.2 lb

## 2017-09-29 DIAGNOSIS — N200 Calculus of kidney: Secondary | ICD-10-CM

## 2017-09-29 MED ORDER — ONDANSETRON 4 MG PO TBDP: 4.0000 mg | ORAL_TABLET | Freq: Three times a day (TID) | ORAL | 0 refills | Status: DC | PRN

## 2017-09-29 MED ORDER — HYDROCODONE-ACETAMINOPHEN 10-325 MG PO TABS
1.0000 | ORAL_TABLET | Freq: Four times a day (QID) | ORAL | 0 refills | Status: DC | PRN
Start: 2017-09-29 — End: 2017-10-08

## 2017-09-29 NOTE — Progress Notes (Signed)
  Subjective:   Patient ID: Ernest GaussRobert D Miller, male    DOB: 12/20/1975, 42 y.o.   MRN: [id number] CC: Nephrolithiasis (Unbearable pain)  HPI: Ernest GaussRobert D Miller is a 42 y.o. male   Seen 5/16 with left flank pain, passed a small stone couple weeks later.  Seen again 6/17 with return and worsening of left flank pain.  Had KUB at that time, showed 3 mm left sided kidney stone.  CT scan denied by insurance.  Currently in appeal.  Referral in to urology.  History of kidney stones requiring basket extraction.  Past 2 days has had ongoing pain, comes and goes.  Has periods of time with no pain, other times when left flank pain feels unbearable.  It is worse when he drinks fluids.  He has not been taking anything for pain at home.  Sometimes he feels nauseous from the pain.  No fevers.  No dysuria.  Otherwise is feeling well other than the left flank pain.  Sitting on his lawnmower sometimes helps with pain.  Relevant past medical, surgical, family and social history reviewed. Allergies and medications reviewed and updated. Social History   Tobacco Use  Smoking Status Never Smoker  Smokeless Tobacco Never Used   ROS: Per HPI   Objective:    BP 139/89   Pulse 64   Temp 97.7 F (36.5 C) (Oral)   Ht 5\' 10"  (1.778 m)   Wt (!) 323 lb 3.2 oz (146.6 kg)   BMI 46.37 kg/m   Wt Readings from Last 3 Encounters:  09/29/17 (!) 323 lb 3.2 oz (146.6 kg)  09/19/17 300 lb (136.1 kg)  08/18/17 (!) 318 lb 3.2 oz (144.3 kg)    Gen: NAD, alert, uncomfortable appearing, standing in room, cooperative with exam, NCAT EYES: EOMI, no conjunctival injection, or no icterus CV: NRRR, normal S1/S2, no murmur, distal pulses 2+ b/l Resp: CTABL, no wheezes, normal WOB Abd: +BS, soft, NTND. no guarding or organomegaly, some left CVA tenderness Ext: No edema, warm Neuro: Alert and oriented  Assessment & Plan:  Ernest Miller was seen today for nephrolithiasis.  Diagnoses and all orders for this visit:  Kidney stone 3 mm stone  on KUB 10 days ago.  Still has not passed stone.  Appealing decline by insurance for CT scan.  Prescription written for hydrocodone for pain management over the next 5 days.  Will need to be seen for refills.  Do not take and drive.  Do not take with the Zofran, can cause excessive sleepiness.  Any fevers or signs of systemic illness needs to be seen immediately. -     ondansetron (ZOFRAN-ODT) 4 MG disintegrating tablet; Take 1 tablet (4 mg total) by mouth every 8 (eight) hours as needed for nausea or vomiting. -     HYDROcodone-acetaminophen (NORCO) 10-325 MG tablet; Take 1 tablet by mouth every 6 (six) hours as needed.   Follow up plan: Return if symptoms worsen or fail to improve. Rex Krasarol Shineka Auble, MD [facility]

## 2017-09-29 NOTE — Telephone Encounter (Signed)
[employer] called to send in office notes for appeal of denial of CT  Done

## 2017-10-07 ENCOUNTER — Telehealth: Payer: Self-pay | Admitting: Pediatrics

## 2017-10-07 NOTE — Telephone Encounter (Signed)
Pt aware by detailed VM - will ntbs for pain meds

## 2017-10-07 NOTE — Telephone Encounter (Signed)
Can come in and see Ernest Miller in AM - cannot do pain meds without being seen

## 2017-10-08 ENCOUNTER — Ambulatory Visit: Payer: BLUE CROSS/BLUE SHIELD | Admitting: Family

## 2017-10-08 ENCOUNTER — Encounter: Payer: Self-pay | Admitting: Family

## 2017-10-08 VITALS — BP 128/80 | HR 75 | Temp 97.2°F | Ht 70.0 in | Wt 320.0 lb

## 2017-10-08 DIAGNOSIS — N200 Calculus of kidney: Secondary | ICD-10-CM | POA: Diagnosis not present

## 2017-10-08 DIAGNOSIS — R309 Painful micturition, unspecified: Secondary | ICD-10-CM

## 2017-10-08 MED ORDER — HYDROCODONE-ACETAMINOPHEN 10-325 MG PO TABS: 1.0000 | ORAL_TABLET | Freq: Four times a day (QID) | ORAL | 0 refills | Status: DC | PRN

## 2017-10-08 NOTE — Progress Notes (Signed)
   Subjective:    Patient ID: Ernest Miller, male    DOB: 06/17/1975, 42 y.o.   MRN: [id number]  Chief Complaint  Patient presents with  . Nephrolithiasis    CT scheduled for MON at 3 pm     HPI PT presents to the office today with back pain and has hx of nephrolithiasis. He was seen in the office on 09/29/17 and given rx of Norco 10-325 mg #20. He states he only has two pills left and is nervous about running out during the weekend. He states he is having intermittent pain of 8-10 out 10.   PT had a 3 mm stone on KUB on 09/19/17.  He states he is having hot flashes, nausea, and pain. He has CT scan scheduled 10/10/17 and has appt with Urologists 10/25/17.    Review of Systems  Gastrointestinal: Positive for nausea.  Musculoskeletal: Positive for back pain.  All other systems reviewed and are negative.      Objective:   Physical Exam  Constitutional: He is oriented to person, place, and time. He appears well-developed and well-nourished. No distress.  HENT:  Head: Normocephalic.  Right Ear: External ear normal.  Left Ear: External ear normal.  Mouth/Throat: Oropharynx is clear and moist.  Eyes: Pupils are equal, round, and reactive to light. Right eye exhibits no discharge. Left eye exhibits no discharge.  Neck: Normal range of motion. Neck supple. No thyromegaly present.  Cardiovascular: Normal rate, regular rhythm, normal heart sounds and intact distal pulses.  No murmur heard. Pulmonary/Chest: Effort normal and breath sounds normal. No respiratory distress. He has no wheezes.  Abdominal: Soft. Bowel sounds are normal. He exhibits no distension. There is no tenderness.  Musculoskeletal: Normal range of motion. He exhibits no edema or tenderness.  CVA tenderness   Neurological: He is alert and oriented to person, place, and time. He has normal reflexes. No cranial nerve deficit.  Skin: Skin is warm. No rash noted. He is diaphoretic. No erythema.  Psychiatric: He has a  normal mood and affect. His behavior is normal. Judgment and thought content normal.  Vitals reviewed.     BP 128/80 (BP Location: Left Arm)   Pulse 75   Temp (!) 97.2 F (36.2 C) (Oral)   Ht 5\' 10"  (1.778 m)   Wt (!) 320 lb (145.2 kg)   BMI 45.92 kg/m      Assessment & Plan:  Ernest Miller was seen today for nephrolithiasis.  Diagnoses and all orders for this visit:  Painful urination -     Cancel: Urine Culture -     Cancel: Urinalysis  Nephrolithiasis -     HYDROcodone-acetaminophen (NORCO) 10-325 MG tablet; Take 1 tablet by mouth every 6 (six) hours as needed.  Kidney stone  Force fluids Keep CT scan and Urologists appt PT reviewed in [place] controlled database, he only received that one rx script from PCP   Jannifer Rodneyhristy Hawks, FNP

## 2017-10-08 NOTE — Patient Instructions (Signed)

## 2017-10-10 ENCOUNTER — Ambulatory Visit (HOSPITAL_COMMUNITY)
Admission: RE | Admit: 2017-10-10 | Discharge: 2017-10-10 | Disposition: A | Discharge status: Home / Self Care | Payer: BLUE CROSS/BLUE SHIELD | Source: Ambulatory Visit | Attending: Pediatrics | Admitting: Pediatrics

## 2017-10-10 DIAGNOSIS — K760 Fatty (change of) liver, not elsewhere classified: Secondary | ICD-10-CM | POA: Insufficient documentation

## 2017-10-10 DIAGNOSIS — K8020 Calculus of gallbladder without cholecystitis without obstruction: Secondary | ICD-10-CM | POA: Insufficient documentation

## 2017-10-10 DIAGNOSIS — R102 Pelvic and perineal pain: Secondary | ICD-10-CM | POA: Insufficient documentation

## 2017-10-10 DIAGNOSIS — R1012 Left upper quadrant pain: Secondary | ICD-10-CM

## 2017-10-10 DIAGNOSIS — N200 Calculus of kidney: Secondary | ICD-10-CM | POA: Insufficient documentation

## 2017-10-11 ENCOUNTER — Other Ambulatory Visit: Payer: Self-pay | Admitting: Pediatrics

## 2017-10-11 DIAGNOSIS — K8020 Calculus of gallbladder without cholecystitis without obstruction: Secondary | ICD-10-CM

## 2017-10-17 ENCOUNTER — Telehealth: Payer: Self-pay | Admitting: Pediatrics

## 2017-10-19 ENCOUNTER — Encounter: Payer: Self-pay | Admitting: Pediatrics

## 2017-10-19 ENCOUNTER — Ambulatory Visit: Payer: BLUE CROSS/BLUE SHIELD | Admitting: Pediatrics

## 2017-10-19 VITALS — BP 142/92 | HR 65 | Temp 97.4°F | Ht 70.0 in | Wt 320.6 lb

## 2017-10-19 DIAGNOSIS — I10 Essential (primary) hypertension: Secondary | ICD-10-CM | POA: Diagnosis not present

## 2017-10-19 DIAGNOSIS — N200 Calculus of kidney: Secondary | ICD-10-CM | POA: Diagnosis not present

## 2017-10-19 DIAGNOSIS — K8020 Calculus of gallbladder without cholecystitis without obstruction: Secondary | ICD-10-CM | POA: Diagnosis not present

## 2017-10-19 MED ORDER — HYDROCODONE-ACETAMINOPHEN 10-325 MG PO TABS: 1.0000 | ORAL_TABLET | Freq: Four times a day (QID) | ORAL | 0 refills | Status: DC | PRN

## 2017-10-19 MED ORDER — ONDANSETRON 4 MG PO TBDP: 4.0000 mg | ORAL_TABLET | Freq: Three times a day (TID) | ORAL | 0 refills | Status: DC | PRN

## 2017-10-19 NOTE — Patient Instructions (Signed)
Let me know if blood pressure at home regularly >140 on top or  >90 on bottom

## 2017-10-19 NOTE — Progress Notes (Signed)
  Subjective:   Patient ID: Ernest Miller, male    DOB: 10/29/1975, 42 y.o.   MRN: [id number] CC: Nephrolithiasis (1 month)  HPI: Ernest GaussRobert D Barcelona is a 42 y.o. male   Has an appointment with urology next week.  Continues to have pain in the flank that comes and goes.  CT scan with 4 mm nonobstructing stone.  No dysuria.  No fevers. He takes hydrocodone 1-2 times a day with okay control his pain.  Plan to DC pain medication once kidney stone passed.  Ongoing right sided abdominal pain, indigestion, worse after eating fatty meals such as steak.  Gallstones present on CT scan.  [facility]  He has for refill of antinausea medicine.  Sometimes after certain meals he does feel quite nauseous.  Hypertension: Feels uncomfortable today in his left flank.  He thinks blood pressure is been elevated from ongoing stress.  He is very hesitant to start a blood pressure medicine.  Relevant past medical, surgical, family and social history reviewed. Allergies and medications reviewed and updated. Social History   Tobacco Use  Smoking Status Never Smoker  Smokeless Tobacco Never Used   ROS: Per HPI   Objective:    BP (!) 142/92   Pulse 65   Temp (!) 97.4 F (36.3 C) (Oral)   Ht 5\' 10"  (1.778 m)   Wt (!) 320 lb 9.6 oz (145.4 kg)   BMI 46.00 kg/m   Wt Readings from Last 3 Encounters:  10/19/17 (!) 320 lb 9.6 oz (145.4 kg)  10/08/17 (!) 320 lb (145.2 kg)  09/29/17 (!) 323 lb 3.2 oz (146.6 kg)    Gen: NAD, alert, cooperative with exam, NCAT EYES: EOMI, no conjunctival injection, or no icterus CV: NRRR, normal S1/S2, no murmur, distal pulses 2+ b/l Resp: CTABL, no wheezes, normal WOB Abd: +BS, soft, mildly tender to palpation left abdomen.  no guarding or organomegaly Ext: No edema, warm Neuro: Alert and oriented MSK: normal muscle bulk  Assessment & Plan:  [name] was seen today for nephrolithiasis.  Diagnoses and all orders for this  visit:  Calculus of gallbladder without cholecystitis without obstruction Ongoing symptoms. Avoid fatty foods. -     Ambulatory referral to General Surgery  Nephrolithiasis #20 tabs of below given -     HYDROcodone-acetaminophen (NORCO) 10-325 MG tablet; Take 1 tablet by mouth every 6 (six) hours as needed.  Kidney stone -     ondansetron (ZOFRAN-ODT) 4 MG disintegrating tablet; Take 1 tablet (4 mg total) by mouth every 8 (eight) hours as needed for nausea or vomiting.  Essential hypertension Pt reluctant to start medicine. Discussed pathophysiology, long term problems of untreated HTN. Check blood pressures at home.  If regularly greater than 140 on top or greater than 90 on bottom let me know, needs to start blood pressure medicine.  Follow up plan: Return in about 2 months (around 12/20/2017). [name], MD [facility]

## 2017-10-25 ENCOUNTER — Telehealth: Payer: Self-pay | Admitting: Pediatrics

## 2017-10-25 ENCOUNTER — Other Ambulatory Visit: Payer: Self-pay | Admitting: Urology

## 2017-10-25 ENCOUNTER — Ambulatory Visit: Payer: BLUE CROSS/BLUE SHIELD | Admitting: General Surgery

## 2017-10-26 ENCOUNTER — Other Ambulatory Visit: Payer: Self-pay

## 2017-10-26 DIAGNOSIS — K8010 Calculus of gallbladder with chronic cholecystitis without obstruction: Secondary | ICD-10-CM

## 2017-10-26 NOTE — Progress Notes (Unsigned)
sur

## 2017-10-27 ENCOUNTER — Ambulatory Visit (HOSPITAL_COMMUNITY)
Admission: RE | Admit: 2017-10-27 | Discharge: 2017-10-27 | Disposition: A | Discharge status: Home / Self Care | Payer: BLUE CROSS/BLUE SHIELD | Source: Ambulatory Visit | Attending: Urology | Admitting: Urology

## 2017-10-27 ENCOUNTER — Other Ambulatory Visit: Payer: Self-pay

## 2017-10-27 ENCOUNTER — Encounter (HOSPITAL_COMMUNITY)
Admission: RE | Disposition: A | Discharge status: Home / Self Care | Payer: Self-pay | Source: Ambulatory Visit | Attending: Urology

## 2017-10-27 ENCOUNTER — Encounter (HOSPITAL_COMMUNITY): Payer: Self-pay | Admitting: *Deleted

## 2017-10-27 ENCOUNTER — Ambulatory Visit (HOSPITAL_COMMUNITY): Payer: BLUE CROSS/BLUE SHIELD

## 2017-10-27 DIAGNOSIS — N200 Calculus of kidney: Secondary | ICD-10-CM | POA: Insufficient documentation

## 2017-10-27 HISTORY — DX: Personal history of urinary calculi: Z87.442

## 2017-10-27 HISTORY — PX: EXTRACORPOREAL SHOCK WAVE LITHOTRIPSY: SHX1557

## 2017-10-27 HISTORY — DX: Dyspnea, unspecified: R06.00

## 2017-10-27 SURGERY — LITHOTRIPSY, ESWL
Anesthesia: LOCAL | Laterality: Left

## 2017-10-27 MED ORDER — DIPHENHYDRAMINE HCL 25 MG PO CAPS
25.0000 mg | ORAL_CAPSULE | ORAL | Status: AC
  Administered 2017-10-27: 25 mg via ORAL
  Filled 2017-10-27: qty 1

## 2017-10-27 MED ORDER — DIAZEPAM 5 MG PO TABS
10.0000 mg | ORAL_TABLET | ORAL | Status: AC
  Administered 2017-10-27: 10 mg via ORAL
  Filled 2017-10-27: qty 2

## 2017-10-27 MED ORDER — SODIUM CHLORIDE 0.9 % IV SOLN
INTRAVENOUS | Status: DC
  Administered 2017-10-27: 15:00:00 via INTRAVENOUS

## 2017-10-27 MED ORDER — CIPROFLOXACIN HCL 500 MG PO TABS
500.0000 mg | ORAL_TABLET | ORAL | Status: AC
  Administered 2017-10-27: 500 mg via ORAL
  Filled 2017-10-27: qty 1

## 2017-10-27 NOTE — Discharge Instructions (Signed)
Moderate Conscious Sedation, Adult, Care After °These instructions provide you with information about caring for yourself after your procedure. Your health care provider may also give you more specific instructions. Your treatment has been planned according to current medical practices, but problems sometimes occur. Call your health care provider if you have any problems or questions after your procedure. °What can I expect after the procedure? °After your procedure, it is common: °To feel sleepy for several hours. °To feel clumsy and have poor balance for several hours. °To have poor judgment for several hours. °To vomit if you eat too soon. ° °Follow these instructions at home: °For at least 24 hours after the procedure: ° °Do not: °Participate in activities where you could fall or become injured. °Drive. °Use heavy machinery. °Drink alcohol. °Take sleeping pills or medicines that cause drowsiness. °Make important decisions or sign legal documents. °Take care of children on your own. °Rest. °Eating and drinking °Follow the diet recommended by your health care provider. °If you vomit: °Drink water, juice, or soup when you can drink without vomiting. °Make sure you have little or no nausea before eating solid foods. °General instructions °Have a responsible adult stay with you until you are awake and alert. °Take over-the-counter and prescription medicines only as told by your health care provider. °If you smoke, do not smoke without supervision. °Keep all follow-up visits as told by your health care provider. This is important. °Contact a health care provider if: °You keep feeling nauseous or you keep vomiting. °You feel light-headed. °You develop a rash. °You have a fever. °Get help right away if: °You have trouble breathing. °This information is not intended to replace advice given to you by your health care provider. Make sure you discuss any questions you have with your health care provider. °Document Released:  01/10/2013 Document Revised: 08/25/2015 Document Reviewed: 07/12/2015 °Elsevier Interactive Patient Education © 2018 [employer] °Lithotripsy, Care After °This sheet gives you information about how to care for yourself after your procedure. Your health care provider may also give you more specific instructions. If you have problems or questions, contact your health care provider. °What can I expect after the procedure? °After the procedure, it is common to have: °· Some blood in your urine. This should only last for a few days. °· Soreness in your back, sides, or upper abdomen for a few days. °· Blotches or bruises on your back where the pressure wave entered the skin. °· Pain, discomfort, or nausea when pieces (fragments) of the kidney stone move through the tube that carries urine from the kidney to the bladder (ureter). Stone fragments may pass soon after the procedure, but they may continue to pass for up to 4-8 weeks. °? If you have severe pain or nausea, contact your health care provider. This may be caused by a large stone that was not broken up, and this may mean that you need more treatment. °· Some pain or discomfort during urination. °· Some pain or discomfort in the lower abdomen or (in men) at the base of the penis. ° °Follow these instructions at home: °Medicines °· Take over-the-counter and prescription medicines only as told by your health care provider. °· If you were prescribed an antibiotic medicine, take it as told by your health care provider. Do not stop taking the antibiotic even if you start to feel better. °· Do not drive for 24 hours if you were given a medicine to help you relax (sedative). °· Do not drive   or use heavy machinery while taking prescription pain medicine. °Eating and drinking °· Drink enough water and fluids to keep your urine clear or pale yellow. This helps any remaining pieces of the stone to pass. It can also help prevent new stones from forming. °· Eat plenty of fresh  fruits and vegetables. °· Follow instructions from your health care provider about eating and drinking restrictions. You may be instructed: °? To reduce how much salt (sodium) you eat or drink. Check ingredients and nutrition facts on packaged foods and beverages. °? To reduce how much meat you eat. °· Eat the recommended amount of calcium for your age and gender. Ask your health care provider how much calcium you should have. °General instructions °· Get plenty of rest. °· Most people can resume normal activities 1-2 days after the procedure. Ask your health care provider what activities are safe for you. °· If directed, strain all urine through the strainer that was provided by your health care provider. °? Keep all fragments for your health care provider to see. Any stones that are found may be sent to a medical lab for examination. The stone may be as small as a grain of salt. °· Keep all follow-up visits as told by your health care provider. This is important. °Contact a health care provider if: °· You have pain that is severe or does not get better with medicine. °· You have nausea that is severe or does not go away. °· You have blood in your urine longer than your health care provider told you to expect. °· You have more blood in your urine. °· You have pain during urination that does not go away. °· You urinate more frequently than usual and this does not go away. °· You develop a rash or any other possible signs of an allergic reaction. °Get help right away if: °· You have severe pain in your back, sides, or upper abdomen. °· You have severe pain while urinating. °· Your urine is very dark red. °· You have blood in your stool (feces). °· You cannot pass any urine at all. °· You feel a strong urge to urinate after emptying your bladder. °· You have a fever or chills. °· You develop shortness of breath, difficulty breathing, or chest pain. °· You have severe nausea that leads to persistent vomiting. °· You  faint. °Summary °· After this procedure, it is common to have some pain, discomfort, or nausea when pieces (fragments) of the kidney stone move through the tube that carries urine from the kidney to the bladder (ureter). If this pain or nausea is severe, however, you should contact your health care provider. °· Most people can resume normal activities 1-2 days after the procedure. Ask your health care provider what activities are safe for you. °· Drink enough water and fluids to keep your urine clear or pale yellow. This helps any remaining pieces of the stone to pass, and it can help prevent new stones from forming. °· If directed, strain your urine and keep all fragments for your health care provider to see. Fragments or stones may be as small as a grain of salt. °· Get help right away if you have severe pain in your back, sides, or upper abdomen or have severe pain while urinating. °This information is not intended to replace advice given to you by your health care provider. Make sure you discuss any questions you have with your health care provider. °Document Released: 04/11/2007 Document Revised:   02/11/2016 Document Reviewed: 02/11/2016 °Elsevier Interactive Patient Education © 2018 [employer] ° °

## 2017-11-16 ENCOUNTER — Other Ambulatory Visit: Payer: Self-pay | Admitting: Surgery

## 2017-11-21 ENCOUNTER — Encounter (HOSPITAL_COMMUNITY): Payer: Self-pay | Admitting: *Deleted

## 2017-11-21 ENCOUNTER — Other Ambulatory Visit: Payer: Self-pay

## 2017-11-21 NOTE — Progress Notes (Signed)
Denies chest pain, shortness of breath, or cardiology visit.  

## 2017-11-23 MED ORDER — DEXTROSE 5 % IV SOLN
3.0000 g | INTRAVENOUS | Status: AC
  Administered 2017-11-24: 3 g via INTRAVENOUS
  Filled 2017-11-23: qty 3

## 2017-11-23 NOTE — H&P (Signed)
Ernest Miller Documented: 11/16/2017 4:17 PM Location: [facility] Patient #: [id number] DOB: 01/22/1976 Married / Language: [place] / Race: White Male   History of Present Illness (Ernest Mathieson A. Magnus IvanBlackman MD; 11/16/2017 4:45 PM) The patient is a 42 year old male who presents for evaluation of gall stones. This is a pleasant gentleman referred by Dr. [name] for symptomatic cholelithiasis. The patient reports that he has known he has had gallbladder gallstones for at least 10 years based on other imaging for his chronic kidney stones. He has only been mildly symptomatic until recently. He now has been having abdominal pain with nausea and occasional vomiting for several months. The pain will hurt through to the back. It is described as sharp and in the right upper quadrant. It is moderate to severe. He does have intermittent diarrhea and constipation. He also has undiagnosed sleep apnea. He is otherwise without complaints.   Past Surgical History (Ernest Miller, RMA; 11/16/2017 4:18 PM) Oral Surgery  Tonsillectomy   Allergies (Ernest Miller, RMA; 11/16/2017 4:19 PM) No Known Drug Allergies [11/16/2017]: Allergies Reconciled   Medication History (Ernest Miller, RMA; 11/16/2017 4:20 PM) Hydrocodone-Acetaminophen (10-325MG  Tablet, Oral) Active. Escitalopram Oxalate (20MG  Tablet, Oral) Active. Meloxicam (7.5MG  Tablet, Oral) Active. Ondansetron (4MG  Tablet Disint, Oral) Active. Medications Reconciled  Social History (Ernest Miller, RMA; 11/16/2017 4:18 PM) Alcohol use  Occasional alcohol use. Caffeine use  Carbonated beverages, Tea. Tobacco use  Never smoker.  Family History (Ernest Miller, RMA; 11/16/2017 4:18 PM) Alcohol Abuse  Father. Depression  Mother. Diabetes Mellitus  Mother. Heart Disease  Father. Heart disease in male family member before age 44  Heart disease in male family member before age 44   Other Problems (Ernest  A. Manson Miller, RMA; 11/16/2017 4:18 PM) Anxiety Disorder  Back Pain  Cholelithiasis  Depression  Gastric Ulcer  Gastroesophageal Reflux Disease  General anesthesia - complications  Kidney Stone  Sleep Apnea     Review of Systems (Ernest A. Brown RMA; 11/16/2017 4:18 PM) General Present- Appetite Loss, Chills and Fatigue. Not Present- Fever, Night Sweats, Weight Gain and Weight Loss. HEENT Present- Hearing Loss, Oral Ulcers, Ringing in the Ears and Yellow Eyes. Not Present- Earache, Hoarseness, Nose Bleed, Seasonal Allergies, Sinus Pain, Sore Throat, Visual Disturbances and Wears glasses/contact lenses. Respiratory Present- Snoring. Not Present- Bloody sputum, Chronic Cough, Difficulty Breathing and Wheezing. Cardiovascular Present- Difficulty Breathing Lying Down, Leg Cramps and Shortness of Breath. Not Present- Chest Pain, Palpitations, Rapid Heart Rate and Swelling of Extremities. Gastrointestinal Present- Abdominal Pain, Chronic diarrhea, Excessive gas, Nausea and Vomiting. Not Present- Bloating, Bloody Stool, Change in Bowel Habits, Constipation, Difficulty Swallowing, Gets full quickly at meals, Hemorrhoids, Indigestion and Rectal Pain. Male Genitourinary Present- Urgency and Urine Leakage. Not Present- Blood in Urine, Change in Urinary Stream, Frequency, Impotence, Nocturia and Painful Urination. Musculoskeletal Present- Back Pain and Joint Pain. Not Present- Joint Stiffness, Muscle Pain, Muscle Weakness and Swelling of Extremities. Neurological Present- Decreased Memory and Numbness. Not Present- Fainting, Headaches, Seizures, Tingling, Tremor, Trouble walking and Weakness. Psychiatric Present- Anxiety and Depression. Not Present- Bipolar, Change in Sleep Pattern, Fearful and Frequent crying.  Vitals (Ernest A. Brown RMA; 11/16/2017 4:19 PM) 11/16/2017 4:18 PM Weight: 320.6 lb Height: 70in Body Surface Area: 2.55 m Body Mass Index: 46 kg/m  Temp.: 98.67F  Pulse: 92  (Regular)  BP: 132/86 (Sitting, Left Arm, Standard)       Physical Exam ([name] A. Magnus IvanBlackman MD; 11/16/2017 4:45 PM) General Mental Status-Alert.  General Appearance-Consistent with stated age. Hydration-Well hydrated. Voice-Normal.  Head and Neck Head-normocephalic, atraumatic with no lesions or palpable masses.  Eye Eyeball - Bilateral-Extraocular movements intact. Sclera/Conjunctiva - Bilateral-No scleral icterus.  Chest and Lung Exam Chest and lung exam reveals -quiet, even and easy respiratory effort with no use of accessory muscles and on auscultation, normal breath sounds, no adventitious sounds and normal vocal resonance. Inspection Chest Wall - Normal. Back - normal.  Cardiovascular Cardiovascular examination reveals -on palpation PMI is normal in location and amplitude, no palpable S3 or S4. Normal cardiac borders., normal heart sounds, regular rate and rhythm with no murmurs, carotid auscultation reveals no bruits and normal pedal pulses bilaterally.  Abdomen Inspection Inspection of the abdomen reveals - No Hernias. Skin - Scar - no surgical scars. Palpation/Percussion Palpation and Percussion of the abdomen reveal - Soft, No Rebound tenderness, No Rigidity (guarding) and No hepatosplenomegaly. Tenderness - Right Upper Quadrant. Auscultation Auscultation of the abdomen reveals - Bowel sounds normal.  Neurologic Neurologic evaluation reveals -alert and oriented x 3 with no impairment of recent or remote memory. Mental Status-Normal.  Musculoskeletal Normal Exam - Left-Upper Extremity Strength Normal and Lower Extremity Strength Normal. Normal Exam - Right-Upper Extremity Strength Normal, Lower Extremity Weakness.    Assessment & Plan (Rey Dansby A. Magnus IvanBlackman MD; 11/16/2017 4:46 PM) SYMPTOMATIC CHOLELITHIASIS (K80.20) Impression: This is a patient with symptomatic gallstones and I suspect some chronic cholecystitis. Laparoscopic  cholecystectomy is strongly recommended and he is in complete agreement. I gave him literature regarding the surgery and we discussed the surgical procedure in detail. I discussed the risks of surgery which includes but is not limited to bleeding, infection, injury to surrounding structures, the need to convert to an open procedure, bile duct injury, bile leak, cardiopulmonary issues, DVT, etc. He will need to stay overnight given his untreated sleep apnea. He understands and agrees to proceed with surgery which will be scheduled urgently

## 2017-11-24 ENCOUNTER — Ambulatory Visit (HOSPITAL_COMMUNITY): Payer: BLUE CROSS/BLUE SHIELD | Admitting: Certified Registered Nurse Anesthetist

## 2017-11-24 ENCOUNTER — Other Ambulatory Visit: Payer: Self-pay

## 2017-11-24 ENCOUNTER — Encounter (HOSPITAL_COMMUNITY)
Admission: RE | Disposition: A | Discharge status: Home / Self Care | Payer: Self-pay | Source: Ambulatory Visit | Attending: Surgery

## 2017-11-24 ENCOUNTER — Encounter (HOSPITAL_COMMUNITY): Payer: Self-pay | Admitting: Surgery

## 2017-11-24 ENCOUNTER — Observation Stay (HOSPITAL_COMMUNITY)
Admission: RE | Admit: 2017-11-24 | Discharge: 2017-11-25 | Disposition: A | Discharge status: Home / Self Care | Payer: BLUE CROSS/BLUE SHIELD | Source: Ambulatory Visit | Attending: Surgery | Admitting: Surgery

## 2017-11-24 DIAGNOSIS — Z79899 Other long term (current) drug therapy: Secondary | ICD-10-CM | POA: Diagnosis not present

## 2017-11-24 DIAGNOSIS — Z9049 Acquired absence of other specified parts of digestive tract: Secondary | ICD-10-CM

## 2017-11-24 DIAGNOSIS — F419 Anxiety disorder, unspecified: Secondary | ICD-10-CM | POA: Diagnosis not present

## 2017-11-24 DIAGNOSIS — K8010 Calculus of gallbladder with chronic cholecystitis without obstruction: Principal | ICD-10-CM | POA: Insufficient documentation

## 2017-11-24 DIAGNOSIS — K8020 Calculus of gallbladder without cholecystitis without obstruction: Secondary | ICD-10-CM | POA: Diagnosis present

## 2017-11-24 DIAGNOSIS — G4730 Sleep apnea, unspecified: Secondary | ICD-10-CM | POA: Diagnosis not present

## 2017-11-24 DIAGNOSIS — F329 Major depressive disorder, single episode, unspecified: Secondary | ICD-10-CM | POA: Insufficient documentation

## 2017-11-24 DIAGNOSIS — K219 Gastro-esophageal reflux disease without esophagitis: Secondary | ICD-10-CM | POA: Diagnosis not present

## 2017-11-24 HISTORY — DX: Other complications of anesthesia, initial encounter: T88.59XA

## 2017-11-24 HISTORY — DX: Anxiety disorder, unspecified: F41.9

## 2017-11-24 HISTORY — DX: Adverse effect of unspecified anesthetic, initial encounter: T41.45XA

## 2017-11-24 HISTORY — PX: CHOLECYSTECTOMY: SHX55

## 2017-11-24 LAB — COMPREHENSIVE METABOLIC PANEL
ALT: 30 U/L (ref 0–44)
AST: 22 U/L (ref 15–41)
Albumin: 3.6 g/dL (ref 3.5–5.0)
Alkaline Phosphatase: 63 U/L (ref 38–126)
Anion gap: 8 (ref 5–15)
BUN: 21 mg/dL — ABNORMAL HIGH (ref 6–20)
CO2: 26 mmol/L (ref 22–32)
Calcium: 9.2 mg/dL (ref 8.9–10.3)
Chloride: 105 mmol/L (ref 98–111)
Creatinine, Ser: 1.17 mg/dL (ref 0.61–1.24)
GFR calc Af Amer: >60 mL/min (ref >60)
GFR calc non Af Amer: >60 mL/min (ref >60)
Glucose, Bld: 125 mg/dL — ABNORMAL HIGH (ref 70–99)
Potassium: 4.5 mmol/L (ref 3.5–5.1)
Sodium: 139 mmol/L (ref 135–145)
Total Bilirubin: 0.4 mg/dL (ref 0.3–1.2)
Total Protein: 6.8 g/dL (ref 6.5–8.1)

## 2017-11-24 LAB — HEMOGLOBIN: Hemoglobin: 14.8 g/dL (ref 13.0–17.0)

## 2017-11-24 SURGERY — LAPAROSCOPIC CHOLECYSTECTOMY
Anesthesia: General | Site: Abdomen

## 2017-11-24 MED ORDER — MIDAZOLAM HCL 2 MG/2ML IJ SOLN
INTRAMUSCULAR | Status: AC
  Filled 2017-11-24: qty 2

## 2017-11-24 MED ORDER — LIDOCAINE HCL (CARDIAC) PF 100 MG/5ML IV SOSY
PREFILLED_SYRINGE | INTRAVENOUS | Status: DC | PRN
  Administered 2017-11-24: 60 mg via INTRAVENOUS

## 2017-11-24 MED ORDER — HYDROMORPHONE HCL 1 MG/ML IJ SOLN
INTRAMUSCULAR | Status: AC
  Filled 2017-11-24: qty 1

## 2017-11-24 MED ORDER — PHENYLEPHRINE 40 MCG/ML (10ML) SYRINGE FOR IV PUSH (FOR BLOOD PRESSURE SUPPORT)
PREFILLED_SYRINGE | INTRAVENOUS | Status: AC
  Filled 2017-11-24: qty 10

## 2017-11-24 MED ORDER — CHLORHEXIDINE GLUCONATE CLOTH 2 % EX PADS: 6.0000 | MEDICATED_PAD | Freq: Once | CUTANEOUS | Status: DC

## 2017-11-24 MED ORDER — BUPIVACAINE-EPINEPHRINE (PF) 0.25% -1:200000 IJ SOLN
INTRAMUSCULAR | Status: AC
  Filled 2017-11-24: qty 30

## 2017-11-24 MED ORDER — SODIUM CHLORIDE 0.9 % IR SOLN
Status: DC | PRN
  Administered 2017-11-24: 1000 mL

## 2017-11-24 MED ORDER — OXYCODONE HCL 5 MG/5ML PO SOLN: 5.0000 mg | Freq: Once | ORAL | Status: DC | PRN

## 2017-11-24 MED ORDER — OXYCODONE HCL 5 MG PO TABS: 5.0000 mg | ORAL_TABLET | Freq: Once | ORAL | Status: DC | PRN

## 2017-11-24 MED ORDER — BUPIVACAINE-EPINEPHRINE 0.25% -1:200000 IJ SOLN
INTRAMUSCULAR | Status: DC | PRN
  Administered 2017-11-24: 20 mL

## 2017-11-24 MED ORDER — 0.9 % SODIUM CHLORIDE (POUR BTL) OPTIME
TOPICAL | Status: DC | PRN
  Administered 2017-11-24: 1000 mL

## 2017-11-24 MED ORDER — FENTANYL CITRATE (PF) 250 MCG/5ML IJ SOLN
INTRAMUSCULAR | Status: AC
  Filled 2017-11-24: qty 5

## 2017-11-24 MED ORDER — DEXAMETHASONE SODIUM PHOSPHATE 10 MG/ML IJ SOLN
INTRAMUSCULAR | Status: AC
  Filled 2017-11-24: qty 1

## 2017-11-24 MED ORDER — MIDAZOLAM HCL 5 MG/5ML IJ SOLN
INTRAMUSCULAR | Status: DC | PRN
  Administered 2017-11-24: 2 mg via INTRAVENOUS

## 2017-11-24 MED ORDER — TRAMADOL HCL 50 MG PO TABS
50.0000 mg | ORAL_TABLET | Freq: Four times a day (QID) | ORAL | Status: DC | PRN
  Administered 2017-11-24: 50 mg via ORAL

## 2017-11-24 MED ORDER — ONDANSETRON HCL 4 MG/2ML IJ SOLN
INTRAMUSCULAR | Status: AC
  Filled 2017-11-24: qty 2

## 2017-11-24 MED ORDER — ONDANSETRON HCL 4 MG/2ML IJ SOLN: 4.0000 mg | Freq: Four times a day (QID) | INTRAMUSCULAR | Status: DC | PRN

## 2017-11-24 MED ORDER — DIPHENHYDRAMINE HCL 25 MG PO CAPS: 25.0000 mg | ORAL_CAPSULE | Freq: Four times a day (QID) | ORAL | Status: DC | PRN

## 2017-11-24 MED ORDER — SUCCINYLCHOLINE CHLORIDE 20 MG/ML IJ SOLN
INTRAMUSCULAR | Status: DC | PRN
  Administered 2017-11-24: 140 mg via INTRAVENOUS

## 2017-11-24 MED ORDER — GLYCOPYRROLATE 0.2 MG/ML IJ SOLN
INTRAMUSCULAR | Status: DC | PRN
  Administered 2017-11-24: 0.1 mg via INTRAVENOUS

## 2017-11-24 MED ORDER — TRAMADOL HCL 50 MG PO TABS
ORAL_TABLET | ORAL | Status: AC
  Filled 2017-11-24: qty 1

## 2017-11-24 MED ORDER — DEXAMETHASONE SODIUM PHOSPHATE 10 MG/ML IJ SOLN
INTRAMUSCULAR | Status: DC | PRN
  Administered 2017-11-24: 10 mg via INTRAVENOUS

## 2017-11-24 MED ORDER — OXYCODONE HCL 5 MG PO TABS
5.0000 mg | ORAL_TABLET | ORAL | Status: DC | PRN
  Administered 2017-11-24 (×2): 10 mg via ORAL
  Administered 2017-11-24: 5 mg via ORAL
  Administered 2017-11-25 (×2): 10 mg via ORAL
  Filled 2017-11-24 (×4): qty 2

## 2017-11-24 MED ORDER — SUGAMMADEX SODIUM 200 MG/2ML IV SOLN
INTRAVENOUS | Status: DC | PRN
  Administered 2017-11-24: 300 mg via INTRAVENOUS

## 2017-11-24 MED ORDER — EPHEDRINE 5 MG/ML INJ
INTRAVENOUS | Status: AC
  Filled 2017-11-24: qty 10

## 2017-11-24 MED ORDER — ONDANSETRON HCL 4 MG/2ML IJ SOLN
INTRAMUSCULAR | Status: DC | PRN
  Administered 2017-11-24: 4 mg via INTRAVENOUS

## 2017-11-24 MED ORDER — ROCURONIUM BROMIDE 100 MG/10ML IV SOLN
INTRAVENOUS | Status: DC | PRN
  Administered 2017-11-24: 50 mg via INTRAVENOUS

## 2017-11-24 MED ORDER — DIPHENHYDRAMINE HCL 50 MG/ML IJ SOLN: 25.0000 mg | Freq: Four times a day (QID) | INTRAMUSCULAR | Status: DC | PRN

## 2017-11-24 MED ORDER — LACTATED RINGERS IV SOLN
INTRAVENOUS | Status: DC | PRN
  Administered 2017-11-24: 07:00:00 via INTRAVENOUS

## 2017-11-24 MED ORDER — OXYCODONE HCL 5 MG PO TABS
ORAL_TABLET | ORAL | Status: AC
  Filled 2017-11-24: qty 2

## 2017-11-24 MED ORDER — SUCCINYLCHOLINE CHLORIDE 200 MG/10ML IV SOSY
PREFILLED_SYRINGE | INTRAVENOUS | Status: AC
  Filled 2017-11-24: qty 10

## 2017-11-24 MED ORDER — ENOXAPARIN SODIUM 40 MG/0.4ML ~~LOC~~ SOLN
40.0000 mg | SUBCUTANEOUS | Status: DC
  Administered 2017-11-25: 40 mg via SUBCUTANEOUS
  Filled 2017-11-24: qty 0.4

## 2017-11-24 MED ORDER — KETOROLAC TROMETHAMINE 30 MG/ML IJ SOLN
INTRAMUSCULAR | Status: DC | PRN
  Administered 2017-11-24: 30 mg via INTRAVENOUS

## 2017-11-24 MED ORDER — STERILE WATER FOR IRRIGATION IR SOLN
Status: DC | PRN
  Administered 2017-11-24: 1000 mL

## 2017-11-24 MED ORDER — PROPOFOL 10 MG/ML IV BOLUS
INTRAVENOUS | Status: AC
  Filled 2017-11-24: qty 20

## 2017-11-24 MED ORDER — ONDANSETRON HCL 4 MG/2ML IJ SOLN
4.0000 mg | Freq: Four times a day (QID) | INTRAMUSCULAR | Status: AC | PRN
  Administered 2017-11-24: 4 mg via INTRAVENOUS

## 2017-11-24 MED ORDER — PROPOFOL 10 MG/ML IV BOLUS
INTRAVENOUS | Status: DC | PRN
  Administered 2017-11-24: 240 mg via INTRAVENOUS

## 2017-11-24 MED ORDER — ONDANSETRON 4 MG PO TBDP: 4.0000 mg | ORAL_TABLET | Freq: Four times a day (QID) | ORAL | Status: DC | PRN

## 2017-11-24 MED ORDER — FENTANYL CITRATE (PF) 100 MCG/2ML IJ SOLN
INTRAMUSCULAR | Status: DC | PRN
  Administered 2017-11-24: 75 ug via INTRAVENOUS
  Administered 2017-11-24: 25 ug via INTRAVENOUS
  Administered 2017-11-24: 150 ug via INTRAVENOUS

## 2017-11-24 MED ORDER — HYDROMORPHONE HCL 1 MG/ML IJ SOLN
1.0000 mg | INTRAMUSCULAR | Status: DC | PRN
  Administered 2017-11-24: 1 mg via INTRAVENOUS

## 2017-11-24 MED ORDER — PANTOPRAZOLE SODIUM 40 MG PO TBEC: 40.0000 mg | DELAYED_RELEASE_TABLET | Freq: Every day | ORAL | Status: DC

## 2017-11-24 MED ORDER — ESCITALOPRAM OXALATE 20 MG PO TABS: 20.0000 mg | ORAL_TABLET | Freq: Every day | ORAL | Status: DC

## 2017-11-24 MED ORDER — HYDROMORPHONE HCL 1 MG/ML IJ SOLN
0.2500 mg | INTRAMUSCULAR | Status: DC | PRN
  Administered 2017-11-24 (×4): 0.5 mg via INTRAVENOUS

## 2017-11-24 SURGICAL SUPPLY — 36 items
ADH SKN CLS APL DERMABOND .7 (GAUZE/BANDAGES/DRESSINGS) ×1
APPLIER CLIP 5 13 M/L LIGAMAX5 (MISCELLANEOUS) ×2
APR CLP MED LRG 5 ANG JAW (MISCELLANEOUS) ×1
BAG SPEC RTRVL LRG 6X4 10 (ENDOMECHANICALS) ×1
CANISTER SUCT 3000ML PPV (MISCELLANEOUS) ×2 IMPLANT
CHLORAPREP W/TINT 26ML (MISCELLANEOUS) ×2 IMPLANT
CLIP APPLIE 5 13 M/L LIGAMAX5 (MISCELLANEOUS) ×1 IMPLANT
COVER SURGICAL LIGHT HANDLE (MISCELLANEOUS) ×2 IMPLANT
DERMABOND ADVANCED (GAUZE/BANDAGES/DRESSINGS) ×1
DERMABOND ADVANCED .7 DNX12 (GAUZE/BANDAGES/DRESSINGS) ×1 IMPLANT
ELECT REM PT RETURN 9FT ADLT (ELECTROSURGICAL) ×2
ELECTRODE REM PT RTRN 9FT ADLT (ELECTROSURGICAL) ×1 IMPLANT
GLOVE BIO SURGEON STRL SZ8 (GLOVE) ×1 IMPLANT
GLOVE BIOGEL PI IND STRL 8 (GLOVE) IMPLANT
GLOVE BIOGEL PI INDICATOR 8 (GLOVE) ×1
GLOVE SURG SIGNA 7.5 PF LTX (GLOVE) ×2 IMPLANT
GLOVE SURG SS PI 7.5 STRL IVOR (GLOVE) ×2 IMPLANT
GOWN STRL REUS W/ TWL LRG LVL3 (GOWN DISPOSABLE) ×2 IMPLANT
GOWN STRL REUS W/ TWL XL LVL3 (GOWN DISPOSABLE) ×1 IMPLANT
GOWN STRL REUS W/TWL LRG LVL3 (GOWN DISPOSABLE) ×6
GOWN STRL REUS W/TWL XL LVL3 (GOWN DISPOSABLE) ×2
HOVERMATT SINGLE USE (MISCELLANEOUS) ×1 IMPLANT
KIT BASIN OR (CUSTOM PROCEDURE TRAY) ×2 IMPLANT
KIT TURNOVER KIT B (KITS) ×2 IMPLANT
NS IRRIG 1000ML POUR BTL (IV SOLUTION) ×2 IMPLANT
PAD ARMBOARD 7.5X6 YLW CONV (MISCELLANEOUS) ×2 IMPLANT
POUCH SPECIMEN RETRIEVAL 10MM (ENDOMECHANICALS) ×2 IMPLANT
SCISSORS LAP 5X35 DISP (ENDOMECHANICALS) ×2 IMPLANT
SET IRRIG TUBING LAPAROSCOPIC (IRRIGATION / IRRIGATOR) ×2 IMPLANT
SLEEVE ENDOPATH XCEL 5M (ENDOMECHANICALS) ×4 IMPLANT
SPECIMEN JAR SMALL (MISCELLANEOUS) ×2 IMPLANT
TRAY LAPAROSCOPIC MC (CUSTOM PROCEDURE TRAY) ×2 IMPLANT
TROCAR XCEL BLUNT TIP 100MML (ENDOMECHANICALS) ×2 IMPLANT
TROCAR XCEL NON-BLD 5MMX100MML (ENDOMECHANICALS) ×2 IMPLANT
TUBING INSUFFLATION (TUBING) ×2 IMPLANT
WATER STERILE IRR 1000ML POUR (IV SOLUTION) ×2 IMPLANT

## 2017-11-24 NOTE — Transfer of Care (Signed)
Immediate Anesthesia Transfer of Care Note  Patient: Ernest GaussRobert D Miller  Procedure(s) Performed: LAPAROSCOPIC CHOLECYSTECTOMY (N/A Abdomen)  Patient Location: PACU  Anesthesia Type:General  Level of Consciousness: awake, alert , oriented and patient cooperative  Airway & Oxygen Therapy: Patient Spontanous Breathing and Patient connected to face mask oxygen  Post-op Assessment: Report given to RN and Post -op Vital signs reviewed and stable  Post vital signs: Reviewed and stable  Last Vitals:  Vitals Value Taken Time  BP    Temp    Pulse 66 11/24/2017  8:32 AM  Resp 18 11/24/2017  8:32 AM  SpO2 97 % 11/24/2017  8:32 AM  Vitals shown include unvalidated device data.  Last Pain:  Vitals:   11/24/17 0559  TempSrc:   PainSc: 0-No pain      Patients Stated Pain Goal: 4 (11/24/17 0559)  Complications: No apparent anesthesia complications

## 2017-11-24 NOTE — Anesthesia Preprocedure Evaluation (Signed)
Anesthesia Evaluation  Patient identified by MRN, date of birth, ID band Patient awake    Reviewed: Allergy & Precautions, H&P , NPO status , Patient's Chart, lab work & pertinent test results  Airway Mallampati: II   Neck ROM: full    Dental   Pulmonary shortness of breath,    breath sounds clear to auscultation       Cardiovascular negative cardio ROS   Rhythm:regular Rate:Normal     Neuro/Psych PSYCHIATRIC DISORDERS Anxiety    GI/Hepatic   Endo/Other  diabetes, Type 2  Renal/GU stones     Musculoskeletal   Abdominal   Peds  Hematology   Anesthesia Other Findings   Reproductive/Obstetrics                             Anesthesia Physical Anesthesia Plan  ASA: II  Anesthesia Plan: General   Post-op Pain Management:    Induction: Intravenous  PONV Risk Score and Plan: 2 and Ondansetron, Dexamethasone, Midazolam and Treatment may vary due to age or medical condition  Airway Management Planned: Oral ETT  Additional Equipment:   Intra-op Plan:   Post-operative Plan: Extubation in OR  Informed Consent: I have reviewed the patients History and Physical, chart, labs and discussed the procedure including the risks, benefits and alternatives for the proposed anesthesia with the patient or authorized representative who has indicated his/her understanding and acceptance.     Plan Discussed with: CRNA, Anesthesiologist and Surgeon  Anesthesia Plan Comments:         Anesthesia Quick Evaluation

## 2017-11-24 NOTE — Op Note (Signed)

## 2017-11-24 NOTE — Anesthesia Postprocedure Evaluation (Signed)
Anesthesia Post Note  Patient: Ernest GaussRobert D Miller  Procedure(s) Performed: LAPAROSCOPIC CHOLECYSTECTOMY (N/A Abdomen)     Patient location during evaluation: PACU Anesthesia Type: General Level of consciousness: awake and alert Pain management: pain level controlled Vital Signs Assessment: post-procedure vital signs reviewed and stable Respiratory status: spontaneous breathing, nonlabored ventilation, respiratory function stable and patient connected to nasal cannula oxygen Cardiovascular status: blood pressure returned to baseline and stable Postop Assessment: no apparent nausea or vomiting Anesthetic complications: no    Last Vitals:  Vitals:   11/24/17 1100 11/24/17 1234  BP: (!) 142/87 136/85  Pulse: (!) 58 64  Resp: 12 15  Temp: 36.4 C 37.1 C  SpO2: 95% 96%    Last Pain:  Vitals:   11/24/17 1234  TempSrc: Oral  PainSc:                  Elizbeth Posa S

## 2017-11-24 NOTE — Interval H&P Note (Signed)
History and Physical Interval Note: no change in H and P  11/24/2017 7:10 AM  Ernest Miller  has presented today for surgery, with the diagnosis of SYMPTOMATIC GALLSTONES  The various methods of treatment have been discussed with the patient and family. After consideration of risks, benefits and other options for treatment, the patient has consented to  Procedure(s): LAPAROSCOPIC CHOLECYSTECTOMY (N/A) as a surgical intervention .  The patient's history has been reviewed, patient examined, no change in status, stable for surgery.  I have reviewed the patient's chart and labs.  Questions were answered to the patient's satisfaction.     Eilene Voigt A

## 2017-11-24 NOTE — Anesthesia Procedure Notes (Signed)
Procedure Name: Intubation Date/Time: 11/24/2017 7:42 AM Performed by: Shirlyn Goltz, CRNA Pre-anesthesia Checklist: Patient identified, Emergency Drugs available, Suction available and Patient being monitored Patient Re-evaluated:Patient Re-evaluated prior to induction Oxygen Delivery Method: Circle system utilized Preoxygenation: Pre-oxygenation with 100% oxygen Induction Type: IV induction Ventilation: Mask ventilation with difficulty Laryngoscope Size: Mac and 4 Grade View: Grade III Tube type: Oral Tube size: 7.5 mm Number of attempts: 1 Airway Equipment and Method: Stylet Placement Confirmation: ETT inserted through vocal cords under direct vision,  positive ETCO2 and breath sounds checked- equal and bilateral Secured at: 23 cm Tube secured with: Tape Dental Injury: Teeth and Oropharynx as per pre-operative assessment

## 2017-11-25 ENCOUNTER — Encounter (HOSPITAL_COMMUNITY): Payer: Self-pay | Admitting: Surgery

## 2017-11-25 DIAGNOSIS — K8010 Calculus of gallbladder with chronic cholecystitis without obstruction: Secondary | ICD-10-CM | POA: Diagnosis not present

## 2017-11-25 MED ORDER — OXYCODONE HCL 5 MG PO TABS: 5.0000 mg | ORAL_TABLET | Freq: Four times a day (QID) | ORAL | 0 refills | Status: DC | PRN

## 2017-11-25 NOTE — Discharge Instructions (Signed)
[facility] ______[facility] LAPAROSCOPIC SURGERY: POST OP INSTRUCTIONS Always review your discharge instruction sheet given to you by the facility where your surgery was performed. IF YOU HAVE DISABILITY OR FAMILY LEAVE FORMS, YOU MUST BRING THEM TO THE OFFICE FOR PROCESSING.   DO NOT GIVE THEM TO YOUR DOCTOR.  1. A prescription for pain medication may be given to you upon discharge.  Take your pain medication as prescribed, if needed.  If narcotic pain medicine is not needed, then you may take acetaminophen (Tylenol) or ibuprofen (Advil) as needed. 2. Take your usually prescribed medications unless otherwise directed. 3. If you need a refill on your pain medication, please contact your pharmacy.  They will contact our office to request authorization. Prescriptions will not be filled after 5pm or on week-ends. 4. You should follow a light diet the first few days after arrival home, such as soup and crackers, etc.  Be sure to include lots of fluids daily. 5. Most patients will experience some swelling and bruising in the area of the incisions.  Ice packs will help.  Swelling and bruising can take several days to resolve.  6. It is common to experience some constipation if taking pain medication after surgery.  Increasing fluid intake and taking a stool softener (such as Colace) will usually help or prevent this problem from occurring.  A mild laxative (Milk of Magnesia or Miralax) should be taken according to package instructions if there are no bowel movements after 48 hours. 7. Unless discharge instructions indicate otherwise, you may remove your bandages 24-48 hours after surgery, and you may shower at that time.  You may have steri-strips (small skin tapes) in place directly over the incision.  These strips should be left on the skin for 7-10 days.  If your surgeon used skin glue on the incision, you may shower in 24 hours.  The glue will flake off over the next 2-3 weeks.  Any sutures or  staples will be removed at the office during your follow-up visit. 8. ACTIVITIES:  You may resume regular (light) daily activities beginning the next day--such as daily self-care, walking, climbing stairs--gradually increasing activities as tolerated.  You may have sexual intercourse when it is comfortable.  Refrain from any heavy lifting or straining until approved by your doctor. a. You may drive when you are no longer taking prescription pain medication, you can comfortably wear a seatbelt, and you can safely maneuver your car and apply brakes. b. RETURN TO WORK:  _AFTER 4 WEEKS c. _________________________________________________________ 9. You should see your doctor in the office for a follow-up appointment approximately 2-3 weeks after your surgery.  Make sure that you call for this appointment within a day or two after you arrive home to insure a convenient appointment time. 10. OTHER INSTRUCTIONS: ________________________OK TO SHOWER STARTING TODAY 11. NO LIFTING OVER 15 POUNDS FOR 4 WEEKS 12. ICE PACK, TYLENOL, AND IBUPROFEN ALSO FOR PAIN 13. __________________________________________________________________________________________________ __________________________________________________________________________________________________________________________ WHEN TO CALL YOUR DOCTOR: 1. Fever over 101.0 2. Inability to urinate 3. Continued bleeding from incision. 4. Increased pain, redness, or drainage from the incision. 5. Increasing abdominal pain  The clinic staff is available to answer your questions during regular business hours.  Please dont hesitate to call and ask to speak to one of the nurses for clinical concerns.  If you have a medical emergency, go to the nearest emergency room or call 911.  A surgeon from [facility] is always on call at the hospital. [address], [place], [place]  [address] ? [address] [address], [place], [place]   [address] [phone] ? [phone] ? FAX [phone] Web site: [email or web address]

## 2017-11-25 NOTE — Discharge Summary (Signed)
Physician Discharge Summary  Patient ID: Ernest GaussRobert D Miller MRN: [id number] DOB/AGE: 44/10/1975 44 y.o.  Admit date: 11/24/2017 Discharge date: 11/25/2017  Admission Diagnoses:  Discharge Diagnoses:  Active Problems:   S/P laparoscopic cholecystectomy   Discharged Condition: good  Hospital Course: admitted to observation post op.  Did well.  Discharged home POD#1  Consults: None  Significant Diagnostic Studies:   Treatments: surgery: lap chole  Discharge Exam: Blood pressure (!) 153/83, pulse (!) 58, temperature 97.6 F (36.4 C), temperature source Oral, resp. rate 19, height 5\' 6"  (1.676 m), weight (!) 142.9 kg, SpO2 96 %. General appearance: alert, cooperative and no distress Resp: clear to auscultation bilaterally Cardio: regular rate and rhythm, S1, S2 normal, no murmur, click, rub or gallop Incision/Wound: incision clean  Disposition:     Follow-up Information    Abigail MiyamotoBlackman, Johnthomas Lader, MD. Schedule an appointment as soon as possible for a visit in 3 week(s).   Specialty:  General Surgery Contact information: [address] [place] [address] [phone]           Signed: Shelly RubensteinBLACKMAN,Carlen Fils A 11/25/2017, 7:36 AM

## 2017-11-25 NOTE — Progress Notes (Signed)
Patient ID: Ernest GaussRobert D Hartmann, male   DOB: 04/25/1975, 42 y.o.   MRN: [id number]   Doing well No complaints Abdomen soft, NT  Plan: Discharge home

## 2017-11-25 NOTE — Plan of Care (Signed)
  Problem: Clinical Measurements: Goal: Ability to maintain clinical measurements within normal limits will improve Outcome: Progressing   Problem: Nutrition: Goal: Adequate nutrition will be maintained Outcome: Progressing   Problem: Pain Managment: Goal: General experience of comfort will improve Outcome: Progressing   

## 2017-12-26 ENCOUNTER — Ambulatory Visit: Payer: BLUE CROSS/BLUE SHIELD | Admitting: Pediatrics

## 2017-12-26 ENCOUNTER — Encounter: Payer: Self-pay | Admitting: Pediatrics

## 2017-12-26 VITALS — BP 131/87 | HR 64 | Temp 97.1°F | Ht 66.0 in | Wt 323.6 lb

## 2017-12-26 DIAGNOSIS — E119 Type 2 diabetes mellitus without complications: Secondary | ICD-10-CM | POA: Diagnosis not present

## 2017-12-26 DIAGNOSIS — N200 Calculus of kidney: Secondary | ICD-10-CM

## 2017-12-26 DIAGNOSIS — Z9049 Acquired absence of other specified parts of digestive tract: Secondary | ICD-10-CM

## 2017-12-26 DIAGNOSIS — Z6841 Body Mass Index (BMI) 40.0 and over, adult: Secondary | ICD-10-CM

## 2017-12-26 DIAGNOSIS — I10 Essential (primary) hypertension: Secondary | ICD-10-CM

## 2017-12-26 HISTORY — DX: Calculus of kidney: N20.0

## 2017-12-26 LAB — BAYER DCA HB A1C WAIVED: HB A1C (BAYER DCA - WAIVED): 6.2 % (ref <7.0)

## 2017-12-26 NOTE — Progress Notes (Signed)
  Subjective:   Patient ID: Ernest Miller, male    DOB: 02/20/1976, 42 y.o.   MRN: [id number] CC: Medical Management of Chronic Issues  HPI: Ernest Miller is a 42 y.o. male   Kidney stone: Recent lithotripsy.  He does not think he is yet passed the stone.  Pain is improved.  Intermittent.  Primarily on left side.  Status post cholecystectomy: Right-sided pain greatly improved following gallbladder removal.  Not yet back to work following surgery.  Elevated blood pressure: Does not want to start blood pressure medicine.  Diabetes: Avoiding sugary beverages.  Relevant past medical, surgical, family and social history reviewed. Allergies and medications reviewed and updated. Social History   Tobacco Use  Smoking Status Never Smoker  Smokeless Tobacco Never Used   ROS: Per HPI   Objective:    BP 131/87   Pulse 64   Temp (!) 97.1 F (36.2 C) (Oral)   Ht 5\' 6"  (1.676 m)   Wt (!) 323 lb 9.6 oz (146.8 kg)   BMI 52.23 kg/m   Wt Readings from Last 3 Encounters:  12/26/17 (!) 323 lb 9.6 oz (146.8 kg)  11/24/17 (!) 315 lb (142.9 kg)  10/27/17 (!) 321 lb (145.6 kg)    Gen: NAD, alert, cooperative with exam, NCAT EYES: EOMI, no conjunctival injection, or no icterus ENT: OP without erythema LYMPH: no cervical LAD CV: NRRR, normal S1/S2, no murmur, distal pulses 2+ b/l Resp: CTABL, no wheezes, normal WOB Abd: +BS, soft, NTND. no guarding or organomegaly Ext: No edema, warm Neuro: Alert and oriented  Assessment & Plan:  Ernest Miller was seen today for medical management of chronic issues.  Diagnoses and all orders for this visit:  Diabetes mellitus without complication (HCC) A1c 6.3.  Continue work on lifestyle changes. -     Bayer DCA Hb A1c Waived  Kidney stone Continues to have intermittent symptoms.  Following with urology.  S/P cholecystectomy No diarrhea, abdominal pain improved.  BMI 50.0-59.9, adult (HCC) Continue efforts at weight loss.    Essential  hypertension Recommend blood pressure medicine.  Patient very hesitant.  Will check blood pressures at home.  Let me know if regularly greater than 130s to 140s.  Follow up plan: Return in about 3 months (around 03/27/2018). Rex Krasarol Valdemar Mcclenahan, MD [facility]

## 2018-01-23 ENCOUNTER — Encounter (HOSPITAL_COMMUNITY): Payer: Self-pay | Admitting: Urology

## 2018-02-22 ENCOUNTER — Ambulatory Visit: Payer: BLUE CROSS/BLUE SHIELD | Admitting: Pediatrics

## 2018-03-01 ENCOUNTER — Ambulatory Visit: Payer: BLUE CROSS/BLUE SHIELD | Admitting: Pediatrics

## 2018-03-07 ENCOUNTER — Encounter: Payer: Self-pay | Admitting: Pediatrics

## 2018-03-27 ENCOUNTER — Ambulatory Visit: Payer: BLUE CROSS/BLUE SHIELD | Admitting: Pediatrics

## 2018-04-07 ENCOUNTER — Encounter: Payer: Self-pay | Admitting: Pediatrics

## 2018-07-30 ENCOUNTER — Encounter: Payer: Self-pay | Admitting: General Surgery

## 2018-09-14 ENCOUNTER — Encounter (INDEPENDENT_AMBULATORY_CARE_PROVIDER_SITE_OTHER): Payer: Self-pay

## 2018-09-14 ENCOUNTER — Other Ambulatory Visit: Payer: Self-pay

## 2018-09-15 ENCOUNTER — Encounter: Payer: Self-pay | Admitting: Family

## 2018-09-15 ENCOUNTER — Ambulatory Visit (INDEPENDENT_AMBULATORY_CARE_PROVIDER_SITE_OTHER): Payer: PRIVATE HEALTH INSURANCE | Admitting: Family

## 2018-09-15 DIAGNOSIS — N200 Calculus of kidney: Secondary | ICD-10-CM

## 2018-09-15 DIAGNOSIS — R319 Hematuria, unspecified: Secondary | ICD-10-CM

## 2018-09-15 LAB — MICROSCOPIC EXAMINATION
Bacteria, UA: NONE SEEN
WBC, UA: NONE SEEN /hpf (ref 0–5)

## 2018-09-15 LAB — URINALYSIS, COMPLETE
Bilirubin, UA: NEGATIVE
Glucose, UA: NEGATIVE
Ketones, UA: NEGATIVE
Leukocytes,UA: NEGATIVE
Nitrite, UA: NEGATIVE
Protein,UA: NEGATIVE
RBC, UA: NEGATIVE
Specific Gravity, UA: 1.025 (ref 1.005–1.030)
Urobilinogen, Ur: 1 mg/dL (ref 0.2–1.0)
pH, UA: 7 (ref 5.0–7.5)

## 2018-09-15 MED ORDER — ONDANSETRON HCL 4 MG PO TABS: 4.0000 mg | ORAL_TABLET | Freq: Three times a day (TID) | ORAL | 0 refills | Status: DC | PRN

## 2018-09-15 MED ORDER — TAMSULOSIN HCL 0.4 MG PO CAPS: 0.4000 mg | ORAL_CAPSULE | Freq: Every day | ORAL | 3 refills | Status: DC

## 2018-09-15 MED ORDER — HYDROCODONE-ACETAMINOPHEN 10-325 MG PO TABS: 1.0000 | ORAL_TABLET | Freq: Three times a day (TID) | ORAL | 0 refills | Status: DC | PRN

## 2018-09-15 MED ORDER — KETOROLAC TROMETHAMINE 60 MG/2ML IM SOLN
60.0000 mg | Freq: Once | INTRAMUSCULAR | Status: AC
  Administered 2018-09-15: 60 mg via INTRAMUSCULAR

## 2018-09-15 NOTE — Progress Notes (Signed)
Virtual Visit via telephone Note  Attempted to call patient at 8:40 AM, no answer and unable to leave message. Will try again.   I connected with Ernest GaussRobert D Miller on 09/15/18 at 9:01 AM by telephone and verified that I am speaking with the correct person using two identifiers. [facility] and no one is currently with her during visit. The provider, Jannifer Rodneyhristy Garl Speigner, FNP is located in their office at time of visit.  I discussed the limitations, risks, security and privacy concerns of performing an evaluation and management service by telephone and the availability of in person appointments. I also discussed with the patient that there may be a patient responsible charge related to this service. The patient expressed understanding and agreed to proceed.   History and Present Illness:  PT presents to the office today with left flank and abdominal pain that started Saturday. He states he has a history of kidney stones and had lithotripsy procedure in 10/28/17 and had not had any problems. However, states he mowed a friend's yard on Saturday and it was "very bumpy" and since then has been in a 10 out 10 pain. He states he started his flomax, drinking a case of water,  and taking Motrin with no relief. He states he called his Urologists, but can not get him into the office for 30 days.  Hematuria This is a recurrent problem. The current episode started in the past 7 days (Saturday). The problem has been gradually worsening since onset. His pain is at a severity of 10/10. The pain is moderate. Irritative symptoms include frequency and urgency. Associated symptoms include nausea and vomiting. Pertinent negatives include no dysuria.      Review of Systems  Gastrointestinal: Positive for nausea and vomiting.  Genitourinary: Positive for frequency, hematuria and urgency. Negative for dysuria.     Observations/Objective: No SOB noted, but a grunting in pain.   Assessment  and Plan: 1. Nephrolithiasis - ondansetron (ZOFRAN) 4 MG tablet; Take 1 tablet (4 mg total) by mouth every 8 (eight) hours as needed for nausea or vomiting.  Dispense: 20 tablet; Refill: 0 - tamsulosin (FLOMAX) 0.4 MG CAPS capsule; Take 1 capsule (0.4 mg total) by mouth daily.  Dispense: 30 capsule; Refill: 3 - HYDROcodone-acetaminophen (NORCO) 10-325 MG tablet; Take 1 tablet by mouth every 8 (eight) hours as needed for up to 5 days.  Dispense: 20 tablet; Refill: 0 - ketorolac (TORADOL) injection 60 mg  2. Hematuria, unspecified type - Urinalysis, Complete  Pt is at office and was able to leave urine. We will give Toradol injection while he is here.  Force fluids If urine output decreases or unable to urinate go to ED!!! Pt states he will go to ED if symptoms worsen PT reviewed in  controlled database- Has not received controlled medications since 11/25/17.    I discussed the assessment and treatment plan with the patient. The patient was provided an opportunity to ask questions and all were answered. The patient agreed with the plan and demonstrated an understanding of the instructions.   The patient was advised to call back or seek an in-person evaluation if the symptoms worsen or if the condition fails to improve as anticipated.  The above assessment and management plan was discussed with the patient. The patient verbalized understanding of and has agreed to the management plan. Patient is aware to call the clinic if symptoms persist or worsen. Patient is aware when to return to the  clinic for a follow-up visit. Patient educated on when it is appropriate to go to the emergency department.   Time call ended:  9:17 AM  I provided 16 minutes of non-face-to-face time during this encounter.    [name], FNP

## 2018-09-26 ENCOUNTER — Telehealth: Payer: Self-pay | Admitting: Family

## 2018-09-26 NOTE — Telephone Encounter (Signed)
Can we call the Urologists office and verify this?

## 2018-09-26 NOTE — Telephone Encounter (Signed)
Spoke to the Urology office and his appt was cancelled for today but it doesn't say why.

## 2018-09-26 NOTE — Telephone Encounter (Signed)
Jan can you help with finding a urologist that takes his insurance?

## 2018-09-27 NOTE — Telephone Encounter (Signed)
I spoke with [facility] and what patient is saying is correct.  They do not take his insurance.  The patient will need to contact his insurance company and get a list of covered urologists for [place] to refer him to.

## 2018-09-28 ENCOUNTER — Telehealth: Payer: Self-pay | Admitting: Family

## 2018-09-28 DIAGNOSIS — N200 Calculus of kidney: Secondary | ICD-10-CM

## 2018-09-28 NOTE — Telephone Encounter (Signed)
Patient aware and verbalizes understanding. 

## 2018-09-28 NOTE — Telephone Encounter (Signed)
I placed stat referral to Urologists.

## 2018-09-28 NOTE — Telephone Encounter (Signed)
Do you want to see him in office or call in meds? Please review and advise

## 2018-09-29 ENCOUNTER — Other Ambulatory Visit: Payer: Self-pay

## 2018-09-29 ENCOUNTER — Ambulatory Visit (INDEPENDENT_AMBULATORY_CARE_PROVIDER_SITE_OTHER): Payer: PRIVATE HEALTH INSURANCE | Admitting: Family

## 2018-09-29 ENCOUNTER — Telehealth: Payer: Self-pay | Admitting: Family

## 2018-09-29 ENCOUNTER — Encounter: Payer: Self-pay | Admitting: Family

## 2018-09-29 DIAGNOSIS — N200 Calculus of kidney: Secondary | ICD-10-CM

## 2018-09-29 MED ORDER — ONDANSETRON HCL 4 MG PO TABS: 4.0000 mg | ORAL_TABLET | Freq: Three times a day (TID) | ORAL | 0 refills | Status: DC | PRN

## 2018-09-29 MED ORDER — HYDROCODONE-ACETAMINOPHEN 10-325 MG PO TABS: 1.0000 | ORAL_TABLET | Freq: Four times a day (QID) | ORAL | 0 refills | Status: AC | PRN

## 2018-09-29 NOTE — Telephone Encounter (Signed)
Ernest Miller is in network. Phone number [phone] [facility]. [address]. Please advise.

## 2018-09-29 NOTE — Telephone Encounter (Signed)
Ernest Miller, I think you have been working on this

## 2018-09-29 NOTE — Progress Notes (Signed)
Virtual Visit via telephone Note  I connected with Ernest Miller on 09/29/18 at 2:53 pm by telephone and verified that I am speaking with the correct person using two identifiers. [name] is currently located at work and no one is currently with her during visit. The provider, [name], FNP is located in their office at time of visit.  I discussed the limitations, risks, security and privacy concerns of performing an evaluation and management service by telephone and the availability of in person appointments. I also discussed with the patient that there may be a patient responsible charge related to this service. The patient expressed understanding and agreed to proceed.   History and Present Illness:  Pt calls the office today with recurrent left flank abdominal pain. He called on 09/15/18 with flank pain and abdominal pain. He has a hx of kidney stones and has had lithotripsy in the past. He had schedule an appointment with his Urologists who he saw in the past. However, they could not see him until a several weeks. Once he got an appointment, he was told they no longer accept his insurance. Pt states he continues to have pain of 8 out 10 that causes him to be nauseous. I placed a stat referral to Urologists yesterday and is in the process of scheduling him an appointment.   He states he has continued the flomax and continues to have good output.  Flank Pain This is a recurrent problem. The current episode started 1 to 4 weeks ago. The problem occurs intermittently. The problem has been waxing and waning since onset. Pain location: left flank. The pain is at a severity of 8/10. The pain is moderate. Associated symptoms include abdominal pain. Pertinent negatives include no bladder incontinence or bowel incontinence. He has tried NSAIDs, analgesics and bed rest for the symptoms. The treatment provided mild relief.  Abdominal Pain This is a recurrent problem. The current episode  started 1 to 4 weeks ago. The problem occurs intermittently. The pain is located in the left flank and LLQ. The pain is at a severity of 8/10. The pain is severe. The quality of the pain is sharp, cramping and colicky. Associated symptoms include nausea and vomiting. Pertinent negatives include no hematuria. The pain is aggravated by NSAIDs. The pain is relieved by certain positions. He has tried acetaminophen for the symptoms. The treatment provided mild relief. Prior workup: referral to Urologists       Review of Systems  Gastrointestinal: Positive for abdominal pain, nausea and vomiting. Negative for bowel incontinence.  Genitourinary: Positive for flank pain. Negative for bladder incontinence and hematuria.     Observations/Objective: No SOB or distress   Assessment and Plan: 1. Nephrolithiasis - ondansetron (ZOFRAN) 4 MG tablet; Take 1 tablet (4 mg total) by mouth every 8 (eight) hours as needed for nausea or vomiting.  Dispense: 20 tablet; Refill: 0 - HYDROcodone-acetaminophen (NORCO) 10-325 MG tablet; Take 1 tablet by mouth every 6 (six) hours as needed for up to 7 days.  Dispense: 20 tablet; Refill: 0  2. Kidney stone - ondansetron (ZOFRAN) 4 MG tablet; Take 1 tablet (4 mg total) by mouth every 8 (eight) hours as needed for nausea or vomiting.  Dispense: 20 tablet; Refill: 0 - HYDROcodone-acetaminophen (NORCO) 10-325 MG tablet; Take 1 tablet by mouth every 6 (six) hours as needed for up to 7 days.  Dispense: 20 tablet; Refill: 0  Continue Flomax Force fluids Norco and Zofran as needed- Pt reviewed in  [place] controlled database- no red flags Hopefully he will get in with Urologists this week Go to ED if you have any changes in urine output   I discussed the assessment and treatment plan with the patient. The patient was provided an opportunity to ask questions and all were answered. The patient agreed with the plan and demonstrated an understanding of the instructions.   The patient  was advised to call back or seek an in-person evaluation if the symptoms worsen or if the condition fails to improve as anticipated.  The above assessment and management plan was discussed with the patient. The patient verbalized understanding of and has agreed to the management plan. Patient is aware to call the clinic if symptoms persist or worsen. Patient is aware when to return to the clinic for a follow-up visit. Patient educated on when it is appropriate to go to the emergency department.   Time call ended:  3:09 pm  I provided 16 minutes of non-face-to-face time during this encounter.    Jannifer Rodneyhristy Sugar Vanzandt, FNP

## 2018-09-29 NOTE — Telephone Encounter (Signed)
Pt aware to contact his insurance to see who is in network for him and to call me back. Number given to my direct line to pt

## 2018-10-05 ENCOUNTER — Encounter: Payer: Self-pay | Admitting: Family

## 2018-10-05 ENCOUNTER — Ambulatory Visit (INDEPENDENT_AMBULATORY_CARE_PROVIDER_SITE_OTHER): Payer: PRIVATE HEALTH INSURANCE | Admitting: Family

## 2018-10-05 DIAGNOSIS — R1030 Lower abdominal pain, unspecified: Secondary | ICD-10-CM

## 2018-10-05 DIAGNOSIS — R109 Unspecified abdominal pain: Secondary | ICD-10-CM

## 2018-10-05 NOTE — Progress Notes (Signed)
   Virtual Visit via telephone Note  I connected with Ernest Miller on 10/05/18 at 2:41 pm by telephone and verified that I am speaking with the correct person using two identifiers. Ernest Miller is currently located at work and no one is currently with her during visit. The provider, [name], FNP is located in their office at time of visit.  I discussed the limitations, risks, security and privacy concerns of performing an evaluation and management service by telephone and the availability of in person appointments. I also discussed with the patient that there may be a patient responsible charge related to this service. The patient expressed understanding and agreed to proceed.   History and Present Illness:  Pt calls the office today with recurrent left flank pain that started about a month ago. He has a hx of kidney stones and thought this pain was caused by another stone. He had a Urologists appt on 10/03/18 and had a negative CT that showed No urinary tract calculi or hydronephrosis.  Flank Pain This is a recurrent problem. The current episode started more than 1 month ago. The problem occurs intermittently. The problem has been waxing and waning since onset. Pain location: left flank  The quality of the pain is described as aching. Radiates to: left lower abdomen. The pain is at a severity of 5/10. The pain is moderate. The symptoms are aggravated by sitting. Pertinent negatives include no bladder incontinence, bowel incontinence, leg pain, numbness, perianal numbness or tingling. Risk factors include obesity. He has tried analgesics and NSAIDs for the symptoms. The treatment provided moderate relief.      Review of Systems  Gastrointestinal: Negative for bowel incontinence.  Genitourinary: Positive for flank pain. Negative for bladder incontinence.  Neurological: Negative for tingling and numbness.  All other systems reviewed and are negative.    Observations/Objective: No  SOB or distress noted   Assessment and Plan: Ernest Miller comes in today with chief complaint of No chief complaint on file.   Diagnosis and orders addressed:  1. Flank pain - Amylase - CBC with Differential/Platelet - BMP8+EGFR - Lipase - Hepatic function panel  2. Lower abdominal pain - Amylase - CBC with Differential/Platelet - BMP8+EGFR - Lipase - Hepatic function panel   Urologists and CT reviewed PT will come in today and have lab work drawn He will follow up next week If pain worsens go to ED    I discussed the assessment and treatment plan with the patient. The patient was provided an opportunity to ask questions and all were answered. The patient agreed with the plan and demonstrated an understanding of the instructions.   The patient was advised to call back or seek an in-person evaluation if the symptoms worsen or if the condition fails to improve as anticipated.  The above assessment and management plan was discussed with the patient. The patient verbalized understanding of and has agreed to the management plan. Patient is aware to call the clinic if symptoms persist or worsen. Patient is aware when to return to the clinic for a follow-up visit. Patient educated on when it is appropriate to go to the emergency department.   Time call ended: 3:03 pm   I provided 22 minutes of non-face-to-face time during this encounter.    [name], FNP

## 2018-10-06 ENCOUNTER — Other Ambulatory Visit: Payer: Self-pay | Admitting: Family

## 2018-10-06 LAB — CBC WITH DIFFERENTIAL/PLATELET
Basophils Absolute: 0.2 10*3/uL (ref 0.0–0.2)
Basos: 1 %
EOS (ABSOLUTE): 1.5 10*3/uL — ABNORMAL HIGH (ref 0.0–0.4)
Eos: 12 %
Hematocrit: 45.4 % (ref 37.5–51.0)
Hemoglobin: 15 g/dL (ref 13.0–17.7)
Immature Grans (Abs): 0 10*3/uL (ref 0.0–0.1)
Immature Granulocytes: 0 %
Lymphocytes Absolute: 3.6 10*3/uL — ABNORMAL HIGH (ref 0.7–3.1)
Lymphs: 28 %
MCH: 30.1 pg (ref 26.6–33.0)
MCHC: 33 g/dL (ref 31.5–35.7)
MCV: 91 fL (ref 79–97)
Monocytes Absolute: 1 10*3/uL — ABNORMAL HIGH (ref 0.1–0.9)
Monocytes: 8 %
Neutrophils Absolute: 6.4 10*3/uL (ref 1.4–7.0)
Neutrophils: 51 %
Platelets: 314 10*3/uL (ref 150–450)
RBC: 4.99 x10E6/uL (ref 4.14–5.80)
RDW: 13.2 % (ref 11.6–15.4)
WBC: 12.6 10*3/uL — ABNORMAL HIGH (ref 3.4–10.8)

## 2018-10-06 LAB — BMP8+EGFR
BUN/Creatinine Ratio: 16 (ref 9–20)
BUN: 18 mg/dL (ref 6–24)
CO2: 25 mmol/L (ref 20–29)
Calcium: 9.3 mg/dL (ref 8.7–10.2)
Chloride: 102 mmol/L (ref 96–106)
Creatinine, Ser: 1.16 mg/dL (ref 0.76–1.27)
GFR calc Af Amer: 88 mL/min/{1.73_m2} (ref >59)
GFR calc non Af Amer: 76 mL/min/{1.73_m2} (ref >59)
Glucose: 108 mg/dL — ABNORMAL HIGH (ref 65–99)
Potassium: 4.4 mmol/L (ref 3.5–5.2)
Sodium: 138 mmol/L (ref 134–144)

## 2018-10-06 LAB — HEPATIC FUNCTION PANEL
ALT: 23 IU/L (ref 0–44)
AST: 21 IU/L (ref 0–40)
Albumin: 4.3 g/dL (ref 4.0–5.0)
Alkaline Phosphatase: 70 IU/L (ref 39–117)
Bilirubin Total: <0.2 mg/dL (ref 0.0–1.2)
Bilirubin, Direct: 0.07 mg/dL (ref 0.00–0.40)
Total Protein: 7 g/dL (ref 6.0–8.5)

## 2018-10-06 LAB — LIPASE: Lipase: 84 U/L — ABNORMAL HIGH (ref 13–78)

## 2018-10-06 LAB — AMYLASE: Amylase: 77 U/L (ref 31–110)

## 2018-10-12 ENCOUNTER — Other Ambulatory Visit: Payer: Self-pay

## 2018-10-13 ENCOUNTER — Ambulatory Visit (INDEPENDENT_AMBULATORY_CARE_PROVIDER_SITE_OTHER): Payer: PRIVATE HEALTH INSURANCE | Admitting: Family

## 2018-10-13 ENCOUNTER — Encounter: Payer: Self-pay | Admitting: Family

## 2018-10-13 VITALS — BP 149/97 | HR 68 | Temp 97.9°F | Ht 66.0 in | Wt 311.8 lb

## 2018-10-13 DIAGNOSIS — R109 Unspecified abdominal pain: Secondary | ICD-10-CM

## 2018-10-13 DIAGNOSIS — F411 Generalized anxiety disorder: Secondary | ICD-10-CM | POA: Diagnosis not present

## 2018-10-13 DIAGNOSIS — N12 Tubulo-interstitial nephritis, not specified as acute or chronic: Secondary | ICD-10-CM | POA: Diagnosis not present

## 2018-10-13 LAB — URINALYSIS, COMPLETE
Bilirubin, UA: NEGATIVE
Glucose, UA: NEGATIVE
Ketones, UA: NEGATIVE
Leukocytes,UA: NEGATIVE
Nitrite, UA: NEGATIVE
Protein,UA: NEGATIVE
RBC, UA: NEGATIVE
Specific Gravity, UA: >1.03 — ABNORMAL HIGH (ref 1.005–1.030)
Urobilinogen, Ur: 0.2 mg/dL (ref 0.2–1.0)
pH, UA: 5.5 (ref 5.0–7.5)

## 2018-10-13 LAB — MICROSCOPIC EXAMINATION: RBC: NONE SEEN /hpf (ref 0–2)

## 2018-10-13 MED ORDER — CIPROFLOXACIN HCL 500 MG PO TABS: 500.0000 mg | ORAL_TABLET | Freq: Two times a day (BID) | ORAL | 0 refills | Status: DC

## 2018-10-13 NOTE — Patient Instructions (Signed)
Pyelonephritis, Adult Pyelonephritis is an infection that occurs in the kidney. The kidneys are the organs that filter a person's blood and move waste out of the bloodstream and into the urine. Urine passes from the kidneys, through tubes called ureters, and into the bladder. There are two main types of pyelonephritis:  Infections that come on quickly without any warning (acute pyelonephritis).  Infections that last for a long period of time (chronic pyelonephritis). In most cases, the infection clears up with treatment and does not cause further problems. More severe infections or chronic infections can sometimes spread to the bloodstream or lead to other problems with the kidneys. What are the causes? This condition is usually caused by:  Bacteria traveling from the bladder up to the kidney. This may occur after having a bladder infection (cystitis) or urinary tract infection (UTI).  Bladder infections caused from bacteria traveling from the bloodstream to the kidney. What increases the risk? This condition is more likely to develop in:  Pregnant women.  Older people.  People who have any of these conditions: ? Diabetes. ? Inflammation of the prostate gland (prostatitis), in males. ? Kidney stones or bladder stones. ? Other abnormalities of the kidney or ureter. ? Cancer.  People who have a catheter placed in the bladder.  People who are sexually active.  Women who use spermicides.  People who have had a prior UTI. What are the signs or symptoms? Symptoms of this condition include:  Frequent urination.  Strong or persistent urge to urinate.  Burning or stinging when urinating.  Abdominal pain.  Back pain.  Pain in the side or flank area.  Fever or chills.  Blood in the urine, or dark urine.  Nausea or vomiting. How is this diagnosed? This condition may be diagnosed based on:  Your medical history and a physical exam.  Urine tests.  Blood tests. You may  also have imaging tests of the kidneys, such as an ultrasound or CT scan. How is this treated? Treatment for this condition may depend on the severity of the infection.  If the infection is mild and is found early, you may be treated with antibiotic medicines taken by mouth (orally). You will need to drink fluids to remain hydrated.  If the infection is more severe, you may need to stay in the hospital and receive antibiotics given directly into a vein through an IV. You may also need to receive fluids through an IV if you are not able to remain hydrated. After your hospital stay, you may need to take oral antibiotics for a period of time. Other treatments may be required, depending on the cause of the infection. Follow these instructions at home: Medicines  Take your antibiotic medicine as told by your health care provider. Do not stop taking the antibiotic even if you start to feel better.  Take over-the-counter and prescription medicines only as told by your health care provider. General instructions   Drink enough fluid to keep your urine pale yellow.  Avoid caffeine, tea, and carbonated beverages. They tend to irritate the bladder.  Urinate often. Avoid holding in urine for long periods of time.  Urinate before and after sex.  After a bowel movement, women should cleanse from front to back. Use each tissue only once.  Keep all follow-up visits as told by your health care provider. This is important. Contact a health care provider if:  Your symptoms do not get better after 2 days of treatment.  Your symptoms get worse.    You have a fever. Get help right away if you:  Are unable to take your antibiotics or fluids.  Have shaking chills.  Vomit.  Have severe flank or back pain.  Have extreme weakness or fainting. Summary  Pyelonephritis is a urinary tract infection (UTI) that occurs in the kidney.  Treatment for this condition may depend on the severity of the  infection.  Take your antibiotic medicine as told by your health care provider. Do not stop taking the antibiotic even if you start to feel better.  Drink enough fluid to keep your urine pale yellow.  Keep all follow-up visits as told by your health care provider. This is important. This information is not intended to replace advice given to you by your health care provider. Make sure you discuss any questions you have with your health care provider. Document Released: 03/22/2005 Document Revised: 01/24/2018 Document Reviewed: 01/24/2018 Elsevier Patient Education  2020 [employer]  

## 2018-10-13 NOTE — Progress Notes (Signed)
Subjective:    Patient ID: Ernest Miller, male    DOB: 1975/07/20, 43 y.o.   MRN: [id number]  Chief Complaint  Patient presents with  . Medical Management of Chronic Issues   Pt presents to the office today for chronic follow up and left flank pain that radiates to his groin. He has of hx kidney stones. He saw a Urologists two weeks ago, but had a negative CT and was told to follow up with his PCP.  He states he continues to have intermittent sharp stabbing pain of 10 out 10.   His BP is elevated today, but he states this is related to pain. He states he has stopped his lisinopril long time ago.  Back Pain This is a new problem. The current episode started more than 1 month ago. The problem occurs intermittently. The problem has been waxing and waning since onset. Pain location: left flank pain. The quality of the pain is described as aching. The pain is moderate. The symptoms are aggravated by bending and standing. Pertinent negatives include no bladder incontinence or bowel incontinence. Risk factors include obesity. The treatment provided mild relief.  Depression        This is a chronic problem.  The current episode started more than 1 year ago.   The onset quality is gradual.   The problem occurs intermittently.  The problem has been waxing and waning since onset.  Associated symptoms include decreased concentration, insomnia, irritable and restlessness.  Associated symptoms include no helplessness and no hopelessness.  Past treatments include SSRIs - Selective serotonin reuptake inhibitors.  Past medical history includes anxiety.   Anxiety Presents for follow-up visit. Symptoms include decreased concentration, excessive worry, insomnia, irritability, nervous/anxious behavior, panic and restlessness. Symptoms occur most days. The severity of symptoms is moderate. The quality of sleep is good.        Review of Systems  Constitutional: Positive for irritability.  Gastrointestinal:  Negative for bowel incontinence.  Genitourinary: Negative for bladder incontinence.  Musculoskeletal: Positive for back pain.  Psychiatric/Behavioral: Positive for decreased concentration and depression. The patient is nervous/anxious and has insomnia.   All other systems reviewed and are negative.      Objective:   Physical Exam Vitals signs reviewed.  Constitutional:      General: He is irritable. He is not in acute distress.    Appearance: He is well-developed.  HENT:     Head: Normocephalic.  Eyes:     General:        Right eye: No discharge.        Left eye: No discharge.     Pupils: Pupils are equal, round, and reactive to light.  Neck:     Musculoskeletal: Normal range of motion and neck supple.     Thyroid: No thyromegaly.  Cardiovascular:     Rate and Rhythm: Normal rate and regular rhythm.     Heart sounds: Normal heart sounds. No murmur.  Pulmonary:     Effort: Pulmonary effort is normal. No respiratory distress.     Breath sounds: Normal breath sounds. No wheezing.  Abdominal:     General: Bowel sounds are normal. There is no distension.     Palpations: Abdomen is soft.     Tenderness: There is no abdominal tenderness. There is left CVA tenderness. There is no guarding or rebound.  Musculoskeletal: Normal range of motion.        General: No tenderness.  Skin:    General: Skin is  warm and dry.     Findings: No erythema or rash.  Neurological:     Mental Status: He is alert and oriented to person, place, and time.     Cranial Nerves: No cranial nerve deficit.     Deep Tendon Reflexes: Reflexes are normal and symmetric.  Psychiatric:        Behavior: Behavior normal.        Thought Content: Thought content normal.        Judgment: Judgment normal.       BP (!) 149/97   Pulse 68   Temp 97.9 F (36.6 C) (Oral)   Ht 5\' 6"  (1.676 m)   Wt (!) 311 lb 12.8 oz (141.4 kg)   BMI 50.33 kg/m      Assessment & Plan:  Jena GaussRobert D Seubert comes in today with chief  complaint of Medical Management of Chronic Issues   Diagnosis and orders addressed:  1. GAD (generalized anxiety disorder) - CBC with Differential/Platelet  2. Morbid obesity (HCC) - CBC with Differential/Platelet  3. Flank pain - Urinalysis, Complete - Urine Culture - Chlamydia/Gonococcus/Trichomonas, NAA - CBC with Differential/Platelet  4. Pyelonephritis Moderate amount of bacteria and WBC elevated, will treat as infection Force fluids RTO in 1 week  Culture pending - ciprofloxacin (CIPRO) 500 MG tablet; Take 1 tablet (500 mg total) by mouth 2 (two) times daily.  Dispense: 14 tablet; Refill: 0   Labs pending  Follow up plan: 1 week    Jannifer Rodneyhristy Tuvia Woodrick, FNP

## 2018-10-14 LAB — CBC WITH DIFFERENTIAL/PLATELET
Basophils Absolute: 0.2 10*3/uL (ref 0.0–0.2)
Basos: 2 %
EOS (ABSOLUTE): 0.9 10*3/uL — ABNORMAL HIGH (ref 0.0–0.4)
Eos: 9 %
Hematocrit: 48.1 % (ref 37.5–51.0)
Hemoglobin: 16.2 g/dL (ref 13.0–17.7)
Immature Grans (Abs): 0 10*3/uL (ref 0.0–0.1)
Immature Granulocytes: 0 %
Lymphocytes Absolute: 3.2 10*3/uL — ABNORMAL HIGH (ref 0.7–3.1)
Lymphs: 30 %
MCH: 31.1 pg (ref 26.6–33.0)
MCHC: 33.7 g/dL (ref 31.5–35.7)
MCV: 92 fL (ref 79–97)
Monocytes Absolute: 1 10*3/uL — ABNORMAL HIGH (ref 0.1–0.9)
Monocytes: 10 %
Neutrophils Absolute: 5.3 10*3/uL (ref 1.4–7.0)
Neutrophils: 49 %
Platelets: 311 10*3/uL (ref 150–450)
RBC: 5.21 x10E6/uL (ref 4.14–5.80)
RDW: 13.4 % (ref 11.6–15.4)
WBC: 10.7 10*3/uL (ref 3.4–10.8)

## 2018-10-15 LAB — URINE CULTURE

## 2018-10-16 ENCOUNTER — Other Ambulatory Visit: Payer: Self-pay | Admitting: Family

## 2018-10-16 MED ORDER — AMOXICILLIN-POT CLAVULANATE 875-125 MG PO TABS: 1.0000 | ORAL_TABLET | Freq: Two times a day (BID) | ORAL | 0 refills | Status: AC

## 2018-10-18 LAB — CHLAMYDIA/GONOCOCCUS/TRICHOMONAS, NAA
Chlamydia by NAA: NEGATIVE
Gonococcus by NAA: NEGATIVE
Trich vag by NAA: NEGATIVE

## 2018-10-20 ENCOUNTER — Ambulatory Visit (INDEPENDENT_AMBULATORY_CARE_PROVIDER_SITE_OTHER): Payer: PRIVATE HEALTH INSURANCE | Admitting: Family

## 2018-10-20 ENCOUNTER — Encounter: Payer: Self-pay | Admitting: Family

## 2018-10-20 DIAGNOSIS — N3000 Acute cystitis without hematuria: Secondary | ICD-10-CM

## 2018-10-20 NOTE — Progress Notes (Signed)
   Virtual Visit via telephone Note  I connected with Ernest Miller on 10/20/18 at 8:42 AM  by telephone and verified that I am speaking with the correct person using two identifiers. Ernest Miller is currently located at work and no one is currently with her during visit. The provider, [name], FNP is located in their office at time of visit.  I discussed the limitations, risks, security and privacy concerns of performing an evaluation and management service by telephone and the availability of in person appointments. I also discussed with the patient that there may be a patient responsible charge related to this service. The patient expressed understanding and agreed to proceed.   History and Present Illness:  HPI Pt calls the office today to recheck  Left flank pain. He was started on cipro, but was changed to Augmentin two days ago after his urine culture returned.  He states his pain is about the same. He continues to have intermittent sharp pain of 7 out 10 and continues to have intermittent nausea. He does report dysuria, urgency, and hesitancy, but denies hematuria.    Review of Systems  Genitourinary: Positive for dysuria, flank pain, frequency and urgency. Negative for hematuria.  All other systems reviewed and are negative.    Observations/Objective: No SOB or distress noted   Assessment and Plan: 1. Acute cystitis without hematuria Continue Augmentin After completion he will come and drop off sample Call office if pain does not improve in next 1-2 days Force fluids - Urinalysis, Complete; Future - Urine Culture; Future    I discussed the assessment and treatment plan with the patient. The patient was provided an opportunity to ask questions and all were answered. The patient agreed with the plan and demonstrated an understanding of the instructions.   The patient was advised to call back or seek an in-person evaluation if the symptoms worsen or if the condition  fails to improve as anticipated.  The above assessment and management plan was discussed with the patient. The patient verbalized understanding of and has agreed to the management plan. Patient is aware to call the clinic if symptoms persist or worsen. Patient is aware when to return to the clinic for a follow-up visit. Patient educated on when it is appropriate to go to the emergency department.   Time call ended: 8:59 AM   I provided 17 minutes of non-face-to-face time during this encounter.    [name], FNP

## 2018-12-17 IMAGING — CT CT RENAL STONE PROTOCOL
2 of 7 series · 14 of 46 positions shown, 18 images · non-contrast
Comparison: No priors.

CLINICAL DATA: 41-year-old male with history of left-sided flank
pain for the past 2 weeks. Hematuria. Some nausea.

EXAM:
CT ABDOMEN AND PELVIS WITHOUT CONTRAST
TECHNIQUE: Multidetector CT imaging of the abdomen and pelvis was performed
following the standard protocol without IV contrast.

[Series 2: axial st · axial · 0.91mm/px · z∈[-740,-310]mm · 11 of 98 slices shown, 15 images]
[im 6/98  soft-tissue]
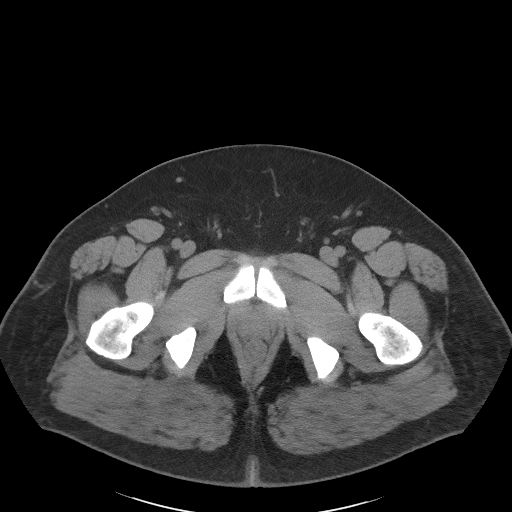
[im 6/98  bone]
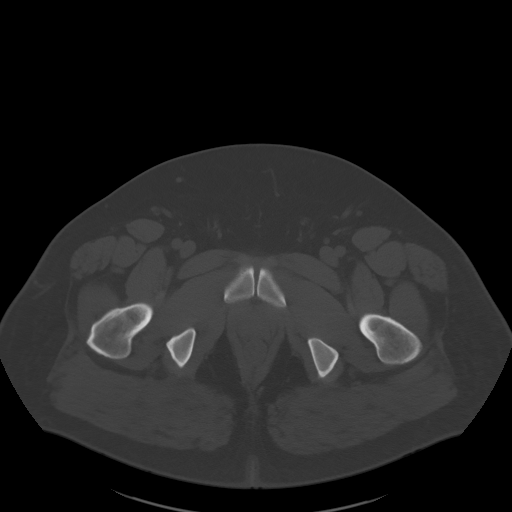
[im 17/98  soft-tissue]
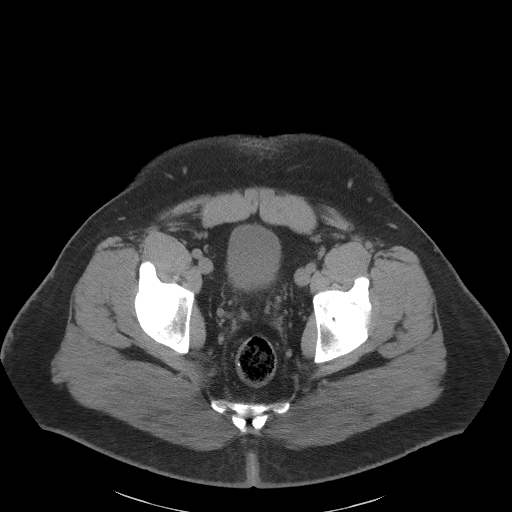
[im 27/98  soft-tissue]
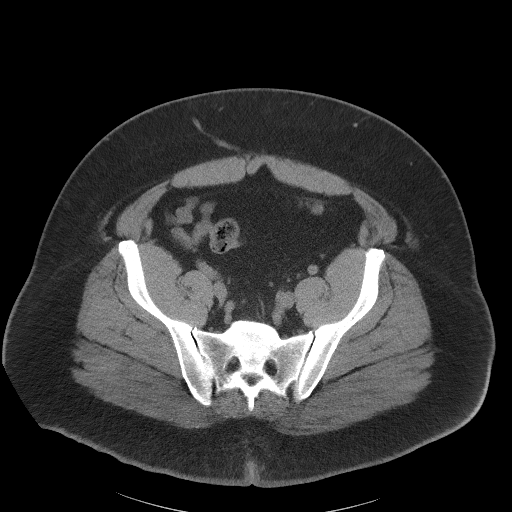
[im 38/98  soft-tissue]
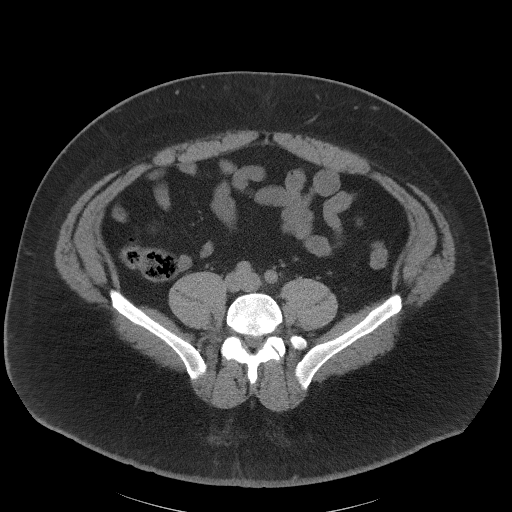
[im 49/98  soft-tissue]
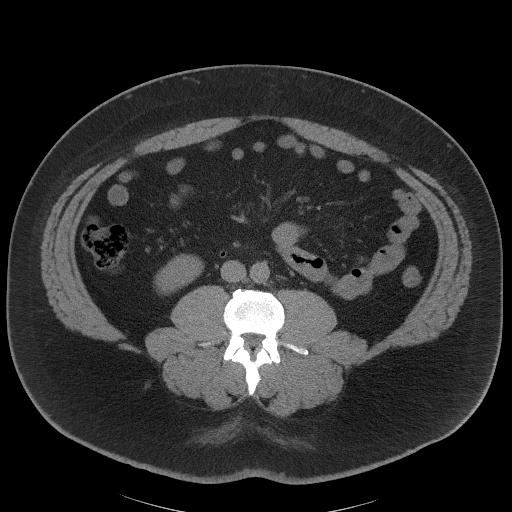
[im 60/98  soft-tissue]
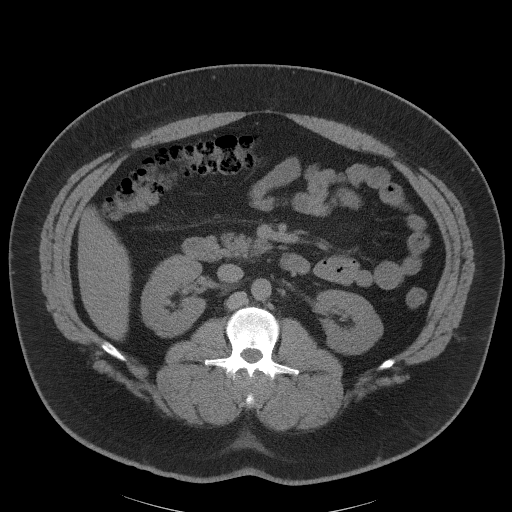
[im 71/98  soft-tissue]
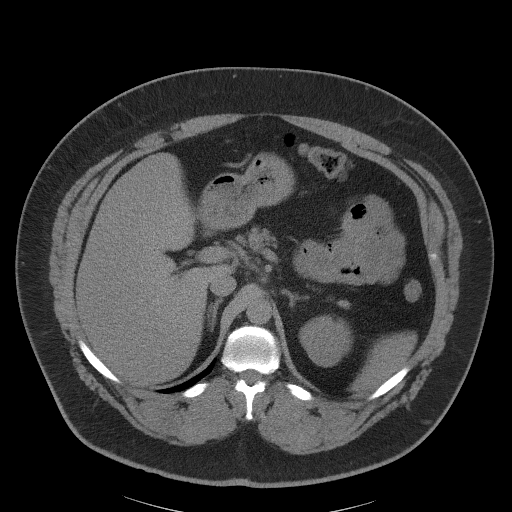
[im 76/98  lung]
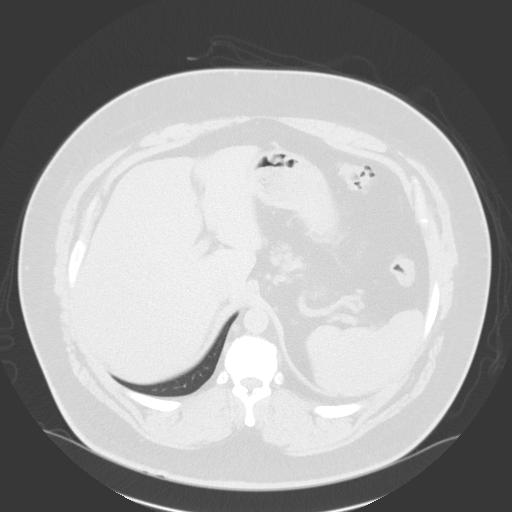
[im 81/98  soft-tissue]
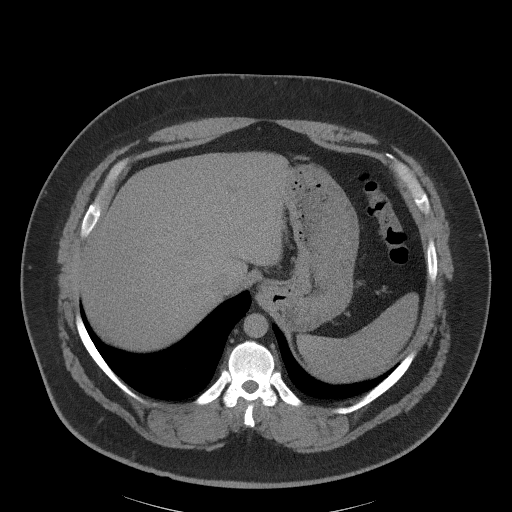
[im 81/98  lung]
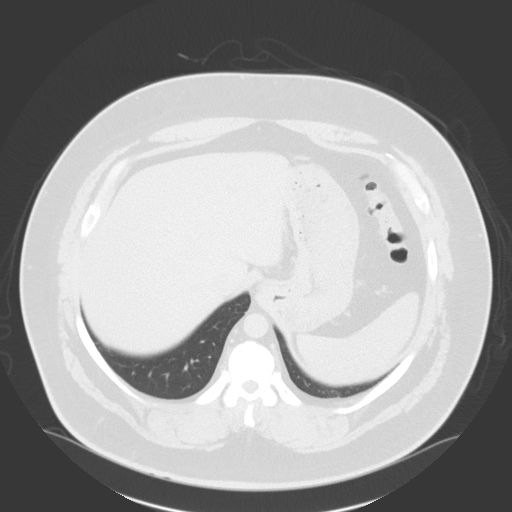
[im 87/98  lung]
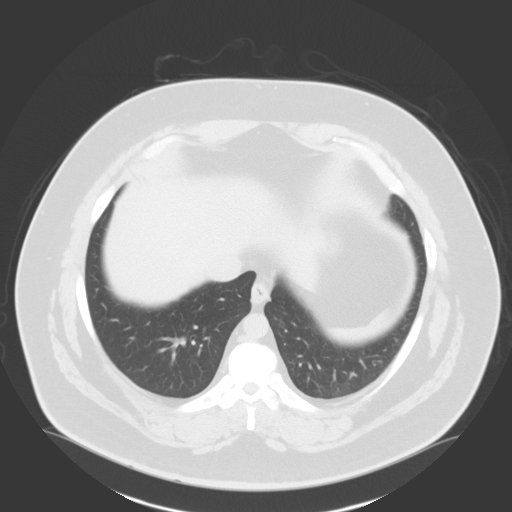
[im 92/98  soft-tissue]
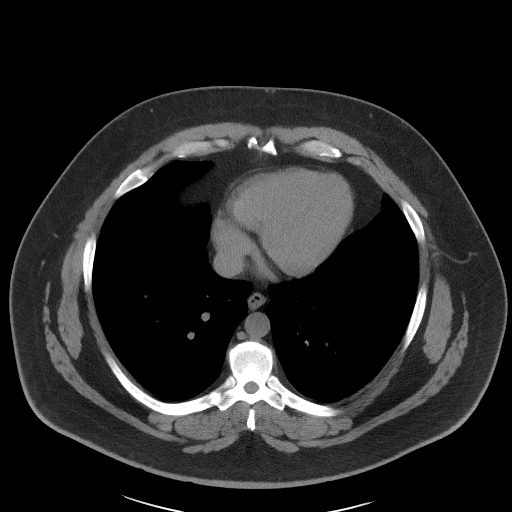
[im 92/98  lung]
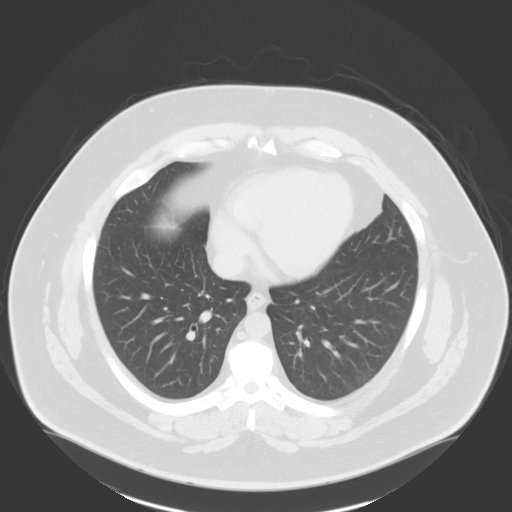
[im 92/98  bone]
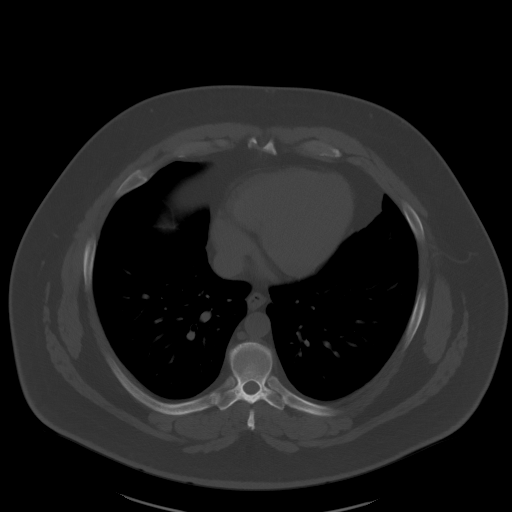

[Series 7: coronal st · coronal · 0.89mm/px · 3 of 122 slices shown]
[im 31/122  soft-tissue]
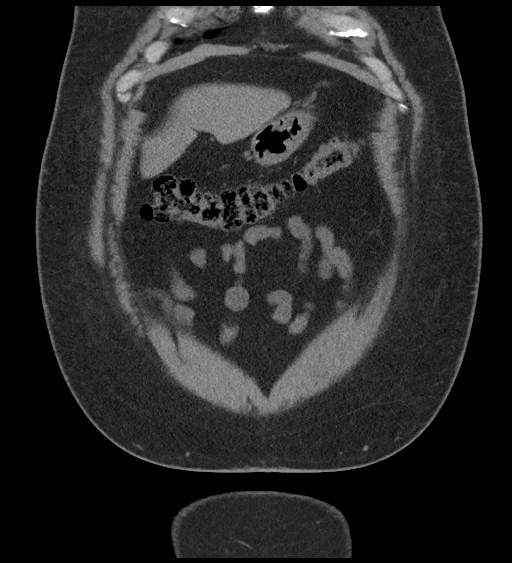
[im 61/122  soft-tissue]
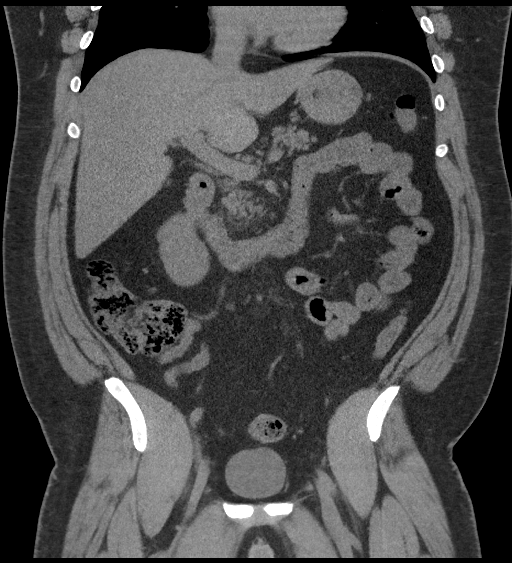
[im 91/122  soft-tissue]
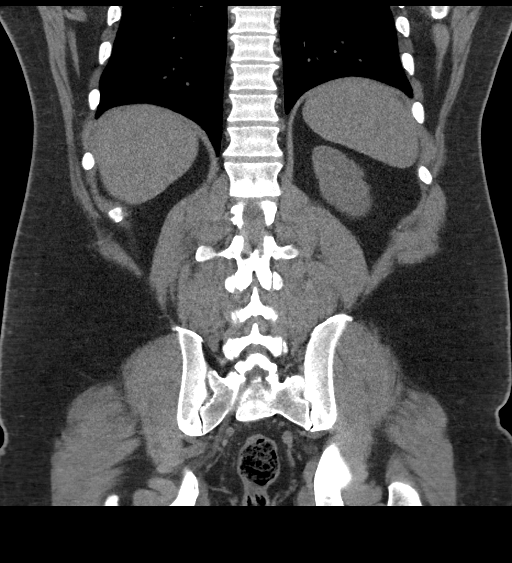

[14 of 46 positions shown; findings below may reference images not displayed]

FINDINGS: Lower chest: Unremarkable.

Hepatobiliary: Mild diffuse low attenuation throughout the hepatic
parenchyma, indicative of hepatic steatosis. No definite cystic or
solid hepatic lesions are confidently identified on today's
noncontrast CT examination. Multiple tiny calcified gallstones lying
dependently in the gallbladder. No findings to suggest an acute
cholecystitis are noted at this time.

Pancreas: No definite pancreatic mass or peripancreatic fluid or
inflammatory changes are noted on today's noncontrast CT
examination.

Spleen: Unremarkable.

Adrenals/Urinary Tract: 4 mm nonobstructive calculus in the
interpolar collecting system of the left kidney. No additional
calculi are noted within the right renal collecting system, along
the course of either ureter, or within the lumen of the urinary
bladder. There is no associated hydroureteronephrosis. Unenhanced
appearance of the kidneys and bilateral adrenal glands is otherwise
unremarkable. Unenhanced appearance of the urinary bladder is
normal.

Stomach/Bowel: Unenhanced appearance of the stomach is normal. No
pathologic dilatation of small bowel or colon. Normal appendix.

Vascular/Lymphatic: No atherosclerotic calcifications in the
abdominal aorta or pelvic vasculature. No lymphadenopathy noted in
the abdomen or pelvis.

Reproductive: Prostate gland and seminal vesicles are unremarkable
in appearance.

Other: No significant volume of ascites.  No pneumoperitoneum.

Musculoskeletal: There are no aggressive appearing lytic or blastic
lesions noted in the visualized portions of the skeleton.
IMPRESSION: 1. 4 mm nonobstructive calculus in the interpolar collecting system
of left kidney. No ureteral stones or findings of urinary tract
obstruction are noted at this time.
2. Cholelithiasis without evidence of acute cholecystitis.
3. Hepatic steatosis.

## 2019-04-25 ENCOUNTER — Telehealth: Payer: Self-pay | Admitting: Family

## 2019-04-25 DIAGNOSIS — K219 Gastro-esophageal reflux disease without esophagitis: Secondary | ICD-10-CM

## 2019-04-25 MED ORDER — OMEPRAZOLE 40 MG PO CPDR: 40.0000 mg | DELAYED_RELEASE_CAPSULE | Freq: Every day | ORAL | 0 refills | Status: DC

## 2019-04-25 NOTE — Telephone Encounter (Signed)
What is the name of the medication? Ernest Miller  Have you contacted your pharmacy to request a refill? Yes  Which pharmacy would you like this sent to? [facility]  Patient notified that their request is being sent to the clinical staff for review and that they should receive a call once it is complete. If they do not receive a call within 24 hours they can check with their pharmacy or our office.   [name]' pt  He paid on balance today bc Insurance was out of network last year.  If he needs an appt please call him today & schedule asap bc he needs meds.

## 2019-04-25 NOTE — Telephone Encounter (Signed)
30 days sent to pharmacy. appt made for 05/08/19

## 2019-05-07 ENCOUNTER — Other Ambulatory Visit: Payer: Self-pay | Admitting: Family

## 2019-05-07 ENCOUNTER — Other Ambulatory Visit: Payer: Self-pay

## 2019-05-07 DIAGNOSIS — K219 Gastro-esophageal reflux disease without esophagitis: Secondary | ICD-10-CM

## 2019-05-08 ENCOUNTER — Encounter: Payer: Self-pay | Admitting: Family

## 2019-05-08 ENCOUNTER — Ambulatory Visit (INDEPENDENT_AMBULATORY_CARE_PROVIDER_SITE_OTHER): Payer: 59 | Admitting: Family

## 2019-05-08 VITALS — BP 144/84 | HR 66 | Temp 98.2°F | Ht 66.0 in | Wt 327.0 lb

## 2019-05-08 DIAGNOSIS — I152 Hypertension secondary to endocrine disorders: Secondary | ICD-10-CM

## 2019-05-08 DIAGNOSIS — E1159 Type 2 diabetes mellitus with other circulatory complications: Secondary | ICD-10-CM

## 2019-05-08 DIAGNOSIS — E1169 Type 2 diabetes mellitus with other specified complication: Secondary | ICD-10-CM | POA: Diagnosis not present

## 2019-05-08 DIAGNOSIS — E119 Type 2 diabetes mellitus without complications: Secondary | ICD-10-CM

## 2019-05-08 DIAGNOSIS — I10 Essential (primary) hypertension: Secondary | ICD-10-CM

## 2019-05-08 DIAGNOSIS — Z9049 Acquired absence of other specified parts of digestive tract: Secondary | ICD-10-CM

## 2019-05-08 DIAGNOSIS — F411 Generalized anxiety disorder: Secondary | ICD-10-CM

## 2019-05-08 DIAGNOSIS — E785 Hyperlipidemia, unspecified: Secondary | ICD-10-CM

## 2019-05-08 DIAGNOSIS — K219 Gastro-esophageal reflux disease without esophagitis: Secondary | ICD-10-CM

## 2019-05-08 DIAGNOSIS — F419 Anxiety disorder, unspecified: Secondary | ICD-10-CM

## 2019-05-08 DIAGNOSIS — Z0000 Encounter for general adult medical examination without abnormal findings: Secondary | ICD-10-CM | POA: Diagnosis not present

## 2019-05-08 LAB — BAYER DCA HB A1C WAIVED: HB A1C (BAYER DCA - WAIVED): 7 % — ABNORMAL HIGH (ref <7.0)

## 2019-05-08 MED ORDER — BUSPIRONE HCL 5 MG PO TABS: 5.0000 mg | ORAL_TABLET | Freq: Three times a day (TID) | ORAL | 4 refills | Status: DC

## 2019-05-08 MED ORDER — OMEPRAZOLE 40 MG PO CPDR: DELAYED_RELEASE_CAPSULE | ORAL | 4 refills | Status: DC

## 2019-05-08 MED ORDER — BLOOD GLUCOSE METER KIT: PACK | 0 refills | Status: DC

## 2019-05-08 MED ORDER — LISINOPRIL 20 MG PO TABS: 20.0000 mg | ORAL_TABLET | Freq: Every day | ORAL | 4 refills | Status: DC

## 2019-05-08 MED ORDER — ESCITALOPRAM OXALATE 20 MG PO TABS: 20.0000 mg | ORAL_TABLET | Freq: Every day | ORAL | 1 refills | Status: DC

## 2019-05-08 NOTE — Progress Notes (Signed)
Subjective:    Patient ID: Ernest Miller, male    DOB: Sep 28, 1975, 44 y.o.   MRN: [id number]  Chief Complaint  Patient presents with  . Medical Management of Chronic Issues   Pt presents to the office today for CPE.  Diabetes He presents for his follow-up diabetic visit. He has type 2 diabetes mellitus. His disease course has been stable. Hypoglycemia symptoms include nervousness/anxiousness. Pertinent negatives for diabetes include no blurred vision, no chest pain and no foot paresthesias. Symptoms are stable. Pertinent negatives for diabetic complications include no CVA, heart disease, nephropathy or peripheral neuropathy. Risk factors for coronary artery disease include dyslipidemia, diabetes mellitus, male sex, hypertension and sedentary lifestyle. He is following a generally healthy diet. (Does not check BS at home ) Eye exam is not current.  Hyperlipidemia This is a chronic problem. The current episode started more than 1 year ago. The problem is uncontrolled. Recent lipid tests were reviewed and are normal. Exacerbating diseases include obesity. Pertinent negatives include no chest pain or shortness of breath. He is currently on no antihyperlipidemic treatment. The current treatment provides mild improvement of lipids. Risk factors for coronary artery disease include dyslipidemia, male sex, hypertension and a sedentary lifestyle.  Hypertension This is a chronic problem. The current episode started more than 1 year ago. The problem has been waxing and waning since onset. The problem is uncontrolled. Associated symptoms include anxiety. Pertinent negatives include no blurred vision, chest pain, malaise/fatigue, peripheral edema or shortness of breath. Risk factors for coronary artery disease include obesity, male gender, dyslipidemia and diabetes mellitus. The current treatment provides moderate improvement. There is no history of kidney disease, CAD/MI or CVA.  Anxiety Presents for follow-up  visit. Symptoms include excessive worry, irritability and nervous/anxious behavior. Patient reports no chest pain, depressed mood or shortness of breath. Symptoms occur occasionally. The severity of symptoms is moderate.    Gastroesophageal Reflux He complains of belching and heartburn. He reports no chest pain. This is a chronic problem. The current episode started more than 1 year ago. The problem occurs occasionally. He has tried a PPI for the symptoms. The treatment provided moderate relief.      Review of Systems  Constitutional: Positive for irritability. Negative for malaise/fatigue.  Eyes: Negative for blurred vision.  Respiratory: Negative for shortness of breath.   Cardiovascular: Negative for chest pain.  Gastrointestinal: Positive for heartburn.  Psychiatric/Behavioral: The patient is nervous/anxious.   All other systems reviewed and are negative.  History reviewed. No pertinent family history.  Social History   Socioeconomic History  . Marital status: Married    Spouse name: Not on file  . Number of children: Not on file  . Years of education: Not on file  . Highest education level: Not on file  Occupational History  . Not on file  Tobacco Use  . Smoking status: Never Smoker  . Smokeless tobacco: Never Used  Substance and Sexual Activity  . Alcohol use: No  . Drug use: Yes    Types: Marijuana  . Sexual activity: Yes  Other Topics Concern  . Not on file  Social History Narrative  . Not on file   Social Determinants of Health   Financial Resource Strain:   . Difficulty of Paying Living Expenses: Not on file  Food Insecurity:   . Worried About Charity fundraiser in the Last Year: Not on file  . Ran Out of Food in the Last Year: Not on file  Transportation Needs:   .  Lack of Transportation (Medical): Not on file  . Lack of Transportation (Non-Medical): Not on file  Physical Activity:   . Days of Exercise per Week: Not on file  . Minutes of Exercise per  Session: Not on file  Stress:   . Feeling of Stress : Not on file  Social Connections:   . Frequency of Communication with Friends and Family: Not on file  . Frequency of Social Gatherings with Friends and Family: Not on file  . Attends Religious Services: Not on file  . Active Member of Clubs or Organizations: Not on file  . Attends Archivist Meetings: Not on file  . Marital Status: Not on file        Objective:   Physical Exam Vitals reviewed.  Constitutional:      General: He is not in acute distress.    Appearance: He is well-developed. He is obese.  HENT:     Head: Normocephalic.     Right Ear: Tympanic membrane normal.     Left Ear: Tympanic membrane normal.  Eyes:     General:        Right eye: No discharge.        Left eye: No discharge.     Pupils: Pupils are equal, round, and reactive to light.  Neck:     Thyroid: No thyromegaly.  Cardiovascular:     Rate and Rhythm: Normal rate and regular rhythm.     Heart sounds: Normal heart sounds. No murmur.  Pulmonary:     Effort: Pulmonary effort is normal. No respiratory distress.     Breath sounds: Normal breath sounds. No wheezing.  Abdominal:     General: Bowel sounds are normal. There is no distension.     Palpations: Abdomen is soft.     Tenderness: There is no abdominal tenderness.  Musculoskeletal:        General: No tenderness. Normal range of motion.     Cervical back: Normal range of motion and neck supple.  Skin:    General: Skin is warm and dry.     Findings: No erythema or rash.  Neurological:     Mental Status: He is alert and oriented to person, place, and time.     Cranial Nerves: No cranial nerve deficit.     Deep Tendon Reflexes: Reflexes are normal and symmetric.  Psychiatric:        Behavior: Behavior normal.        Thought Content: Thought content normal.        Judgment: Judgment normal.      BP (!) 144/84   Pulse 66   Temp 98.2 F (36.8 C) (Temporal)   Ht _0  (1.676  m)   Wt (!) 327 lb (148.3 kg)   SpO2 99%   BMI 52.78 kg/m      Assessment & Plan:  Ernest Miller comes in today with chief complaint of Medical Management of Chronic Issues   Diagnosis and orders addressed:  1. Diabetes mellitus without complication ([place]) - WGY65+LDJT - CBC with Differential/Platelet - Bayer DCA Hb A1c Waived - Microalbumin / creatinine urine ratio - blood glucose meter kit and supplies; Dispense based on patient and insurance preference. Use up to four times daily as directed. (FOR ICD-10 E10.9, E11.9).  Dispense: 1 each; Refill: 0  2. Hyperlipidemia associated with type 2 diabetes mellitus ([facility]) - CMP14+EGFR - CBC with Differential/Platelet  3. GAD (generalized anxiety disorder) Will add Buspar 5 mg TID prn  And continue lexapro - CMP14+EGFR - CBC with Differential/Platelet - busPIRone (BUSPAR) 5 MG tablet; Take 1 tablet (5 mg total) by mouth 3 (three) times daily.  Dispense: 90 tablet; Refill: 4  4. Morbid obesity ([facility]) - CMP14+EGFR - CBC with Differential/Platelet  5. S/P laparoscopic cholecystectomy - CMP14+EGFR - CBC with Differential/Platelet  6. Annual physical exam - CMP14+EGFR - CBC with Differential/Platelet - Lipid panel - PSA, total and free - TSH  7. Gastroesophageal reflux disease - omeprazole (PRILOSEC) 40 MG capsule; TAKE 1 CAPSULE BY MOUTH EVERY DAY  Dispense: 90 capsule; Refill: 4  8. Anxiety - escitalopram (LEXAPRO) 20 MG tablet; Take 1 tablet (20 mg total) by mouth daily.  Dispense: 90 tablet; Refill: 1  9. Hypertension associated with diabetes ([place]) -PT to restart BP - lisinopril (ZESTRIL) 20 MG tablet; Take 1 tablet (20 mg total) by mouth daily.  Dispense: 90 tablet; Refill: 4   Labs pending Health Maintenance reviewed Diet and exercise encouraged  Follow up plan: 1 month to recheck HTN and GAD   [name], FNP

## 2019-05-08 NOTE — Patient Instructions (Signed)

## 2019-05-09 LAB — CBC WITH DIFFERENTIAL/PLATELET
Basophils Absolute: 0.1 10*3/uL (ref 0.0–0.2)
Basos: 2 %
EOS (ABSOLUTE): 0.9 10*3/uL — ABNORMAL HIGH (ref 0.0–0.4)
Eos: 10 %
Hematocrit: 48.2 % (ref 37.5–51.0)
Hemoglobin: 16.2 g/dL (ref 13.0–17.7)
Immature Grans (Abs): 0 10*3/uL (ref 0.0–0.1)
Immature Granulocytes: 0 %
Lymphocytes Absolute: 2.5 10*3/uL (ref 0.7–3.1)
Lymphs: 27 %
MCH: 30.3 pg (ref 26.6–33.0)
MCHC: 33.6 g/dL (ref 31.5–35.7)
MCV: 90 fL (ref 79–97)
Monocytes Absolute: 0.8 10*3/uL (ref 0.1–0.9)
Monocytes: 9 %
Neutrophils Absolute: 5.2 10*3/uL (ref 1.4–7.0)
Neutrophils: 52 %
Platelets: 303 10*3/uL (ref 150–450)
RBC: 5.34 x10E6/uL (ref 4.14–5.80)
RDW: 12.6 % (ref 11.6–15.4)
WBC: 9.6 10*3/uL (ref 3.4–10.8)

## 2019-05-09 LAB — CMP14+EGFR
ALT: 27 IU/L (ref 0–44)
AST: 21 IU/L (ref 0–40)
Albumin/Globulin Ratio: 1.5 (ref 1.2–2.2)
Albumin: 4.4 g/dL (ref 4.0–5.0)
Alkaline Phosphatase: 80 IU/L (ref 39–117)
BUN/Creatinine Ratio: 19 (ref 9–20)
BUN: 21 mg/dL (ref 6–24)
Bilirubin Total: 0.2 mg/dL (ref 0.0–1.2)
CO2: 23 mmol/L (ref 20–29)
Calcium: 10.1 mg/dL (ref 8.7–10.2)
Chloride: 101 mmol/L (ref 96–106)
Creatinine, Ser: 1.11 mg/dL (ref 0.76–1.27)
GFR calc Af Amer: 92 mL/min/{1.73_m2} (ref >59)
GFR calc non Af Amer: 80 mL/min/{1.73_m2} (ref >59)
Globulin, Total: 2.9 g/dL (ref 1.5–4.5)
Glucose: 123 mg/dL — ABNORMAL HIGH (ref 65–99)
Potassium: 5.4 mmol/L — ABNORMAL HIGH (ref 3.5–5.2)
Sodium: 139 mmol/L (ref 134–144)
Total Protein: 7.3 g/dL (ref 6.0–8.5)

## 2019-05-09 LAB — MICROALBUMIN / CREATININE URINE RATIO
Creatinine, Urine: 136.1 mg/dL
Microalb/Creat Ratio: 9 mg/g creat (ref 0–29)
Microalbumin, Urine: 12.9 ug/mL

## 2019-05-09 LAB — LIPID PANEL
Chol/HDL Ratio: 5.9 ratio — ABNORMAL HIGH (ref 0.0–5.0)
Cholesterol, Total: 189 mg/dL (ref 100–199)
HDL: 32 mg/dL — ABNORMAL LOW (ref >39)
LDL Chol Calc (NIH): 122 mg/dL — ABNORMAL HIGH (ref 0–99)
Triglycerides: 194 mg/dL — ABNORMAL HIGH (ref 0–149)
VLDL Cholesterol Cal: 35 mg/dL (ref 5–40)

## 2019-05-09 LAB — PSA, TOTAL AND FREE
PSA, Free Pct: 13.6 %
PSA, Free: 0.19 ng/mL
Prostate Specific Ag, Serum: 1.4 ng/mL (ref 0.0–4.0)

## 2019-05-09 LAB — TSH: TSH: 0.68 u[IU]/mL (ref 0.450–4.500)

## 2019-05-10 ENCOUNTER — Other Ambulatory Visit: Payer: Self-pay | Admitting: Family

## 2019-05-10 MED ORDER — ATORVASTATIN CALCIUM 20 MG PO TABS: 20.0000 mg | ORAL_TABLET | Freq: Every day | ORAL | 3 refills | Status: DC

## 2019-05-31 ENCOUNTER — Other Ambulatory Visit: Payer: Self-pay | Admitting: Family

## 2019-05-31 DIAGNOSIS — F411 Generalized anxiety disorder: Secondary | ICD-10-CM

## 2019-06-02 ENCOUNTER — Other Ambulatory Visit: Payer: Self-pay | Admitting: Family

## 2019-06-04 ENCOUNTER — Other Ambulatory Visit: Payer: Self-pay

## 2019-06-05 ENCOUNTER — Ambulatory Visit (INDEPENDENT_AMBULATORY_CARE_PROVIDER_SITE_OTHER): Payer: 59 | Admitting: Family

## 2019-06-05 ENCOUNTER — Encounter: Payer: Self-pay | Admitting: Family

## 2019-06-05 VITALS — BP 136/81 | HR 89 | Temp 97.4°F | Ht 70.0 in | Wt 328.6 lb

## 2019-06-05 DIAGNOSIS — E1159 Type 2 diabetes mellitus with other circulatory complications: Secondary | ICD-10-CM | POA: Insufficient documentation

## 2019-06-05 DIAGNOSIS — I10 Essential (primary) hypertension: Secondary | ICD-10-CM | POA: Insufficient documentation

## 2019-06-05 DIAGNOSIS — F411 Generalized anxiety disorder: Secondary | ICD-10-CM | POA: Diagnosis not present

## 2019-06-05 LAB — BMP8+EGFR
BUN/Creatinine Ratio: 20 (ref 9–20)
BUN: 22 mg/dL (ref 6–24)
CO2: 21 mmol/L (ref 20–29)
Calcium: 9.4 mg/dL (ref 8.7–10.2)
Chloride: 102 mmol/L (ref 96–106)
Creatinine, Ser: 1.09 mg/dL (ref 0.76–1.27)
GFR calc Af Amer: 94 mL/min/{1.73_m2} (ref >59)
GFR calc non Af Amer: 82 mL/min/{1.73_m2} (ref >59)
Glucose: 134 mg/dL — ABNORMAL HIGH (ref 65–99)
Potassium: 4.5 mmol/L (ref 3.5–5.2)
Sodium: 140 mmol/L (ref 134–144)

## 2019-06-05 NOTE — Progress Notes (Signed)
Subjective:    Patient ID: Ernest Miller, male    DOB: 05-22-1975, 44 y.o.   MRN: [id number]  Chief Complaint  Patient presents with  . Follow-up    1 month to recheck HTN and GAD. Lisinopril made him feel to bad. Lipitor gave headache    PT presents to the office today to recheck HTN and GAD. He was seen on 05/08/19 and added Buspar 5 mg and restarted Lisinopril 20 mg. He reports the lisinopril made him "feel bad". He stopped the medication, but his BP is at goal today.   He also reports he tried the lipitor gave her a headache and stopped that medication.    He states since starting the Buspar 5 mg every night with the lexapro his anxiety is greatly improved.  Anxiety Presents for follow-up visit. Symptoms include excessive worry, irritability, nervous/anxious behavior and restlessness. Patient reports no shortness of breath. Symptoms occur occasionally. The severity of symptoms is moderate. The quality of sleep is good.    Hypertension This is a chronic problem. The current episode started more than 1 year ago. The problem has been resolved since onset. The problem is controlled. Associated symptoms include anxiety. Pertinent negatives include no malaise/fatigue, peripheral edema or shortness of breath. Risk factors for coronary artery disease include diabetes mellitus, dyslipidemia, obesity, male gender and sedentary lifestyle. Past treatments include nothing.  Hyperlipidemia This is a chronic problem. The current episode started more than 1 year ago. The problem is uncontrolled. Pertinent negatives include no shortness of breath. Current antihyperlipidemic treatment includes diet change. The current treatment provides mild improvement of lipids. Risk factors for coronary artery disease include dyslipidemia, diabetes mellitus, male sex, hypertension and a sedentary lifestyle.      Review of Systems  Constitutional: Positive for irritability. Negative for malaise/fatigue.   Respiratory: Negative for shortness of breath.   Psychiatric/Behavioral: The patient is nervous/anxious.   All other systems reviewed and are negative.      Objective:   Physical Exam Vitals reviewed.  Constitutional:      General: He is not in acute distress.    Appearance: He is well-developed. He is obese.  HENT:     Head: Normocephalic.     Right Ear: Tympanic membrane normal.     Left Ear: Tympanic membrane normal.  Eyes:     General:        Right eye: No discharge.        Left eye: No discharge.     Pupils: Pupils are equal, round, and reactive to light.  Neck:     Thyroid: No thyromegaly.  Cardiovascular:     Rate and Rhythm: Normal rate and regular rhythm.     Heart sounds: Normal heart sounds. No murmur.  Pulmonary:     Effort: Pulmonary effort is normal. No respiratory distress.     Breath sounds: Normal breath sounds. No wheezing.  Abdominal:     General: Bowel sounds are normal. There is no distension.     Palpations: Abdomen is soft.     Tenderness: There is no abdominal tenderness.  Musculoskeletal:        General: No tenderness. Normal range of motion.     Cervical back: Normal range of motion and neck supple.  Skin:    General: Skin is warm and dry.     Findings: No erythema or rash.  Neurological:     Mental Status: He is alert and oriented to person, place, and time.  Cranial Nerves: No cranial nerve deficit.     Deep Tendon Reflexes: Reflexes are normal and symmetric.  Psychiatric:        Behavior: Behavior normal.        Thought Content: Thought content normal.        Judgment: Judgment normal.       BP 136/81   Pulse 89   Temp (!) 97.4 F (36.3 C) (Temporal)   Ht '5\' 10"'$  (1.778 m)   Wt (!) 328 lb 9.6 oz (149.1 kg)   SpO2 97%   BMI 47.15 kg/m      Assessment & Plan:  Ernest Miller comes in today with chief complaint of Follow-up (1 month to recheck HTN and GAD. Lisinopril made him feel to bad. Lipitor gave headache  )   Diagnosis and orders addressed:  1. GAD (generalized anxiety disorder) Continue Lexapro and Buspar - BMP8+EGFR  2. Essential hypertension At goal today, since he is a diabetic we need to try low ACE - BMP8+EGFR  3. Morbid obesity ([place])     Ernest Dun, FNP

## 2019-07-02 LAB — HM DIABETES EYE EXAM

## 2019-08-20 ENCOUNTER — Other Ambulatory Visit: Payer: Self-pay

## 2019-08-20 ENCOUNTER — Encounter: Payer: Self-pay | Admitting: Nurse Practitioner

## 2019-08-20 ENCOUNTER — Ambulatory Visit (INDEPENDENT_AMBULATORY_CARE_PROVIDER_SITE_OTHER): Payer: 59 | Admitting: Nurse Practitioner

## 2019-08-20 VITALS — BP 150/88 | HR 76 | Temp 97.8°F | Ht 70.0 in | Wt 333.2 lb

## 2019-08-20 DIAGNOSIS — F411 Generalized anxiety disorder: Secondary | ICD-10-CM

## 2019-08-20 DIAGNOSIS — R0789 Other chest pain: Secondary | ICD-10-CM | POA: Diagnosis not present

## 2019-08-20 DIAGNOSIS — R079 Chest pain, unspecified: Secondary | ICD-10-CM | POA: Insufficient documentation

## 2019-08-20 MED ORDER — BUSPIRONE HCL 7.5 MG PO TABS: 7.5000 mg | ORAL_TABLET | Freq: Three times a day (TID) | ORAL | 0 refills | Status: DC

## 2019-08-20 NOTE — Progress Notes (Signed)
Acute Office Visit  Subjective:    Patient ID: Ernest Miller, male    DOB: 03-Sep-1975, 44 y.o.   MRN: [id number]  Chief Complaint  Patient presents with  . Chest Pain    on and off x 3 months. Patient states when he has chest pain it could last from 2-3 mins to 10-15 mins.  . Numbness    Patient states that it has been going on daily for 3 months- numbness in left arm    Chest Pain  This is a new problem. The current episode started more than 1 month ago. The onset quality is gradual. The problem occurs constantly. The problem has been gradually worsening. The pain is present in the epigastric region. The pain is at a severity of 4/10. The pain is moderate. The quality of the pain is described as pressure. The pain radiates to the left arm and right arm. Associated symptoms include back pain, leg pain, nausea and vomiting. Pertinent negatives include no fever, headaches, palpitations or shortness of breath. The pain is aggravated by nothing. He has tried nothing for the symptoms. Risk factors include stress and obesity.  His past medical history is significant for anxiety/panic attacks and diabetes.    Past Medical History:  Diagnosis Date  . Anxiety   . Complication of anesthesia    had hard time waking patient up in 2000  . Dyspnea    increased exertion   . History of kidney stones   . Kidney stone 12/26/2017    Past Surgical History:  Procedure Laterality Date  . CHOLECYSTECTOMY  11/24/2017   LAPROSCOPIC  . CHOLECYSTECTOMY N/A 11/24/2017   Procedure: LAPAROSCOPIC CHOLECYSTECTOMY;  Surgeon: Coralie Keens, MD;  Location: [facility];  Service: General;  Laterality: N/A;  . ELBOW SURGERY Right   . EXTRACORPOREAL SHOCK WAVE LITHOTRIPSY Left 10/27/2017   Procedure: LEFT EXTRACORPOREAL SHOCK WAVE LITHOTRIPSY (ESWL);  Surgeon: Cleon Gustin, MD;  Location: [facility];  Service: Urology;  Laterality: Left;  . FINGER SURGERY Right    middle  . TONSILLECTOMY    . WISDOM TOOTH  EXTRACTION      Social History   Socioeconomic History  . Marital status: Married    Spouse name: Not on file  . Number of children: Not on file  . Years of education: Not on file  . Highest education level: Not on file  Occupational History  . Not on file  Tobacco Use  . Smoking status: Never Smoker  . Smokeless tobacco: Never Used  Substance and Sexual Activity  . Alcohol use: No  . Drug use: Yes    Types: Marijuana  . Sexual activity: Yes  Other Topics Concern  . Not on file  Social History Narrative  . Not on file   Social Determinants of Health   Financial Resource Strain:   . Difficulty of Paying Living Expenses:   Food Insecurity:   . Worried About Charity fundraiser in the Last Year:   . Arboriculturist in the Last Year:   Transportation Needs:   . Film/video editor (Medical):   Marland Kitchen Lack of Transportation (Non-Medical):   Physical Activity:   . Days of Exercise per Week:   . Minutes of Exercise per Session:   Stress:   . Feeling of Stress :   Social Connections:   . Frequency of Communication with Friends and Family:   . Frequency of Social Gatherings with Friends and Family:   . Attends  Religious Services:   . Active Member of Clubs or Organizations:   . Attends Archivist Meetings:   Marland Kitchen Marital Status:   Intimate Partner Violence:   . Fear of Current or Ex-Partner:   . Emotionally Abused:   Marland Kitchen Physically Abused:   . Sexually Abused:     Outpatient Medications Prior to Visit  Medication Sig Dispense Refill  . blood glucose meter kit and supplies Dispense based on patient and insurance preference. Use up to four times daily as directed. (FOR ICD-10 E10.9, E11.9). 1 each 0  . escitalopram (LEXAPRO) 20 MG tablet Take 1 tablet (20 mg total) by mouth daily. 90 tablet 1  . omeprazole (PRILOSEC) 40 MG capsule TAKE 1 CAPSULE BY MOUTH EVERY DAY 90 capsule 4  . OneTouch Delica Lancets 42A MISC USE TO CHECK SUGAR UP TO 4 TIMES DAILY    . ONETOUCH  VERIO test strip USE TO CHECK SUGAR UP TO 4 TIMES DAILY AS INSTRUCTED 100 strip 5  . busPIRone (BUSPAR) 5 MG tablet TAKE 1 TABLET BY MOUTH THREE TIMES A DAY 270 tablet 1   No facility-administered medications prior to visit.    No Known Allergies  Review of Systems  Constitutional: Negative for activity change, appetite change, fatigue and fever.  HENT: Negative.   Eyes: Negative.   Respiratory: Negative for shortness of breath.   Cardiovascular: Positive for chest pain. Negative for palpitations and leg swelling.  Gastrointestinal: Positive for nausea and vomiting.  Endocrine: Negative.   Genitourinary: Negative for difficulty urinating and dysuria.  Musculoskeletal: Positive for back pain and myalgias. Negative for arthralgias, gait problem and joint swelling.  Skin: Negative for color change and rash.  Neurological: Negative for headaches.       Tingling in bilateral fingers  Psychiatric/Behavioral: The patient is nervous/anxious.        Objective:    Physical Exam Constitutional:      Appearance: He is well-developed. He is obese.  HENT:     Head: Normocephalic.  Cardiovascular:     Rate and Rhythm: Normal rate and regular rhythm.     Heart sounds: Normal heart sounds.  Pulmonary:     Effort: Pulmonary effort is normal.     Breath sounds: Normal breath sounds.  Chest:     Chest wall: Tenderness present. No edema.  Abdominal:     General: Bowel sounds are normal.  Musculoskeletal:     Cervical back: Neck supple.     Right lower leg: No tenderness. No edema.     Left lower leg: No tenderness. No edema.  Skin:    General: Skin is warm.     Findings: No erythema or rash.  Neurological:     Mental Status: He is alert and oriented to person, place, and time.  Psychiatric:        Mood and Affect: Mood is anxious.        Behavior: Behavior normal.     BP (!) 150/88   Pulse 76   Temp 97.8 F (36.6 C) (Temporal)   Ht '5\' 10"'$  (1.778 m)   Wt (!) 333 lb 3.2 oz  (151.1 kg)   SpO2 98%   BMI 47.81 kg/m  Wt Readings from Last 3 Encounters:  08/20/19 (!) 333 lb 3.2 oz (151.1 kg)  06/05/19 (!) 328 lb 9.6 oz (149.1 kg)  05/08/19 (!) 327 lb (148.3 kg)    Health Maintenance Due  Topic Date Due  . OPHTHALMOLOGY EXAM  Never done  .  HIV Screening  Never done  . COVID-19 Vaccine (1) Never done      Lab Results  Component Value Date   TSH 0.680 05/08/2019   Lab Results  Component Value Date   WBC 9.6 05/08/2019   HGB 16.2 05/08/2019   HCT 48.2 05/08/2019   MCV 90 05/08/2019   PLT 303 05/08/2019   Lab Results  Component Value Date   NA 140 06/05/2019   K 4.5 06/05/2019   CO2 21 06/05/2019   GLUCOSE 134 (H) 06/05/2019   BUN 22 06/05/2019   CREATININE 1.09 06/05/2019   BILITOT 0.2 05/08/2019   ALKPHOS 80 05/08/2019   AST 21 05/08/2019   ALT 27 05/08/2019   PROT 7.3 05/08/2019   ALBUMIN 4.4 05/08/2019   CALCIUM 9.4 06/05/2019   ANIONGAP 8 11/24/2017   Lab Results  Component Value Date   CHOL 189 05/08/2019   Lab Results  Component Value Date   HDL 32 (L) 05/08/2019   Lab Results  Component Value Date   LDLCALC 122 (H) 05/08/2019   Lab Results  Component Value Date   TRIG 194 (H) 05/08/2019   Lab Results  Component Value Date   CHOLHDL 5.9 (H) 05/08/2019   Lab Results  Component Value Date   HGBA1C 7.0 (H) 05/08/2019       Assessment & Plan:  Chest pain not well controlled, patient is having worsening chest pain. Patient is also experiencing tingling and numbness bilateral upper extremities.. Cardiology work up recommended and a referral order is completed, provided education for stress management, weight loss, medication administration as prescribed, 12 lead EKG completed. Pt knows to follow up with worsening symptoms or go to ED with Heart attack symptoms. Printed education material provided to patient. Problem List Items Addressed This Visit      Other   GAD (generalized anxiety disorder)    Not well  controlled, patient is experiencing increase stress on his job, changed Buspar from 5 mg daily to 7.5 mg daily. Provided education and printed handout. Patient knows to follow up in 2 weeks.  Rx sent to pharmacy      Relevant Medications   busPIRone (BUSPAR) 7.5 MG tablet   Chest pain - Primary   Relevant Orders   EKG 12-Lead (Completed)   Ambulatory referral to Cardiology     The 10-year ASCVD risk score Mikey Bussing DC Brooke Bonito., et al., 2013) is: 8%   Values used to calculate the score:     Age: 83 years     Sex: Male     Is Non-Hispanic African American: No     Diabetic: Yes     Tobacco smoker: No     Systolic Blood Pressure: 818 mmHg     Is BP treated: No     HDL Cholesterol: 32 mg/dL     Total Cholesterol: 189 mg/dL   Meds ordered this encounter  Medications  . busPIRone (BUSPAR) 7.5 MG tablet    Sig: Take 1 tablet (7.5 mg total) by mouth 3 (three) times daily.    Dispense:  30 tablet    Refill:  0    Order Specific Question:   Supervising Provider    Answer:   Caryl Pina A [[id number]]     Ivy Lynn, NP

## 2019-08-20 NOTE — Assessment & Plan Note (Signed)
Chest pain not well controlled, patient is having worsening chest pain. Patient is also experiencing tingling and numbness bilateral upper extremities.. Cardiology work up recommended and a referral order is completed, provided education for stress management, weight loss, medication administration as prescribed, 12 lead EKG completed. Pt knows to follow up with worsening symptoms or go to ED with Heart attack symptoms. Printed education material provided to patient.

## 2019-08-20 NOTE — Assessment & Plan Note (Signed)
Not well controlled, patient is experiencing increase stress on his job, changed Buspar from 5 mg daily to 7.5 mg daily. Provided education and printed handout. Patient knows to follow up in 2 weeks.  Rx sent to pharmacy

## 2019-08-20 NOTE — Patient Instructions (Addendum)
Chest pain not well controlled, patient is having worsening chest pain. Patient is also experiencing tingling and numbness bilateral upper extremities.. Cardiology work up recommended and a referral order is completed, provided education for stress management, weight loss, medication administration as prescribed, 12 lead EKG completed. Pt knows to follow up with worsening symptoms or go to ED with Heart attack symptoms. Printed education material provided to patient.  Agoraphobia Agoraphobia is a mental health disorder in which a person fears going out in public places where he or she may feel helpless, trapped, or embarrassed in the event of a panic attack. People with this condition have a fear of losing control during a panic attack, and they often start to avoid the situations that they fear or insist on having another person go with them. Agoraphobia may interfere with normal daily activities and personal relationships. People with severe agoraphobia may become completely homebound and dependent on others for daily tasks, such as grocery shopping and taking care of errands. Agoraphobia is a type of anxiety. It usually begins before age 3, but it can start in older adult years. People with agoraphobia are at risk for other anxiety disorders, depression, and substance abuse. What are the causes? The cause of this condition is not known. A variety of factors such as fear of sensations and emotions in anxiety (anxiety sensitivity), family history of anxiety (genetics), and stressful events may contribute to this condition. What increases the risk? You are more likely to develop this condition if:  You are a woman.  You have a panic disorder.  You have family members with agoraphobia. What are the signs or symptoms? You may have agoraphobia if you have any of the following symptoms for 6 months or longer:  Intense fear arising from two or more of the following: ? Using public transportation,  such as cars, buses, planes, trains, or ships. ? Being in open spaces, such as parking lots, shopping malls, or bridges. ? Being in enclosed spaces, such as shops, theaters, or elevators. ? Standing in line or being in a crowd. ? Being outside the home alone.  Fear of being unable to escape or get help if feared events occur. These events include: ? Panic attack. ? Loss of bowel control in older adults.  Reacting to feared situations by: ? Avoiding them. ? Requiring the presence of a companion. ? Enduring them with intense fear or anxiety.  Fear or anxiety that is out of proportion to the actual danger that is posed by the event and the situation. How is this diagnosed? This condition is diagnosed based on:  Your symptoms. You will be asked questions about your fears and how they have affected you.  Your medical history and your use of medicines, alcohol, or drugs.  Physical exam and lab tests. These are usually ordered to rule out other problems that may be causing your symptoms. You may be referred to a mental health specialist (psychiatrist or psychologist). How is this treated? This condition is usually treated using a combination of counseling and medicines.  Counseling or talk therapy. Talk therapy is provided by mental health specialists. The following forms of talk therapy can be especially helpful: ? Cognitive behavioral therapy (CBT). CBT helps you to recognize and change unrealistic thoughts and beliefs that contribute to your fears. You will learn that body changes associated with anxiety (such as increased heart rate and breathing) are completely normal and expected. ? Exposure therapy. This type of therapy helps you to face  and overcome your fears in a relaxed state and in a safe environment. Exposures are usually approached in a systematic way, starting with situations that provoke less fear and building up to situations that provoke more intense fear. Exposure therapy  includes:  Imagined exposure. You will imagine fearful situations and expose yourself to them in your mind.  In vivo exposure. You will face your fears in the real world, such as by standing in a crowded place for a few minutes.  Interoceptive exposure. In a safe environment, you will practice experiencing body changes that are associated with panic attacks. One example is breathing through a straw to experience breathlessness.  Medicines. The following types of medicines may be helpful: ? Antidepressants. These can decrease general levels of anxiety and can help to prevent panic attacks. ? Benzodiazepines. These medicines block feelings of anxiety and panic. ? Beta-blockers. Beta blockers can reduce physical symptoms of anxiety, such as sweating, tremors, and a racing heart. They may help you to feel less tense and anxious. Follow these instructions at home: Lifestyle  Try to exercise. Get 150 or more minutes of physical activity each week. Also aim to do strengthening exercises two or more times a week.  Eat a healthy diet that includes plenty of vegetables, fruits, whole grains, low-fat dairy products, and lean protein. Do not eat a lot of foods that are high in solid fats, added sugars, or salt (sodium).  Get the right amount and quality of sleep. Most adults need 7-9 hours of sleep each night.  Do not drink alcohol.  Do not use illegal drugs. General instructions  Take over-the-counter and prescription medicines only as told by your health care provider.  Keep all follow-up visits as told by your health care provider. This is important. Where to find more information  For more information, visit the website of the Anxiety [employer] of [place] (ADAA): ProgramCam.de Contact a health care provider if:  Your fear or anxiety gets worse.  You have new fears or anxieties. Get help right away if:  You have trouble breathing or have chest pain that you believe may  not be part of a panic attack.  You have serious thoughts about hurting yourself or someone else. If you ever feel like you may hurt yourself or others, or have thoughts about taking your own life, get help right away. You can go to your nearest emergency department or call:  Your local emergency services (911 in the [place]  A suicide crisis helpline, such as the National Suicide Prevention Lifeline at (725)275-3147. This is open 24 hours a day. Summary  Agoraphobia is a type of anxiety disorder that causes a person to avoid situations that he or she fears, such as being in public or being in crowded spaces.  People with agoraphobia often have panic attacks. They may avoid situations in which they feel escape is difficult or panic attacks are likely to occur.  Agoraphobia is treated with medicines or cognitive behavioral therapy (CBT). This information is not intended to replace advice given to you by your health care provider. Make sure you discuss any questions you have with your health care provider. Document Revised: 07/11/2018 Document Reviewed: 11/11/2016 Elsevier Patient Education  2020 Elsevier Inc. Angina  Angina is very bad discomfort or pain in the chest, neck, arm, jaw, or back. The discomfort is caused by a lack of blood in the middle layer of the heart wall (myocardium). What are the causes? This condition is caused  by a buildup of fat and cholesterol (plaque) in your arteries (atherosclerosis). This buildup narrows the arteries and makes it hard for blood to flow. What increases the risk? You are more likely to develop this condition if:  You have high levels of cholesterol in your blood.  You have high blood pressure (hypertension).  You have diabetes.  You have a family history of heart disease.  You are not active, or you do not exercise enough.  You feel sad (depressed).  You have been treated with high energy rays (radiation) on the left side of your  chest. Other risk factors are:  Using tobacco.  Being very overweight (obese).  Eating a diet high in unhealthy fats (saturated fats).  Having stress, or being exposed to things that cause stress.  Using drugs, such as cocaine. Women have a greater risk for angina if:  They are older than 87.  They have stopped having their period (are in postmenopause). What are the signs or symptoms? Common symptoms of this condition in both men and women may include:  Chest pain, which may: ? Feel like a crushing or squeezing in the chest. ? Feel like a tightness, pressure, fullness, or heaviness in the chest. ? Last for more than a few minutes at a time. ? Stop and come back (recur) after a few minutes.  Pain in the neck, arm, jaw, or back.  Heartburn or upset stomach (indigestion) for no reason.  Being short of breath.  Feeling sick to your stomach (nauseous).  Sudden cold sweats. Women and people with diabetes may have other symptoms that are not usual, such as feeling:  Tired (fatigue).  Worried or nervous (anxious) for no reason.  Weak for no reason.  Dizzy or passing out (fainting). How is this treated? This condition may be treated with:  Medicines. These are given to: ? Prevent blood clots. ? Prevent heart attack. ? Relax blood vessels and improve blood flow to the heart (nitrates). ? Reduce blood pressure. ? Improve the pumping action of the heart. ? Reduce fat and cholesterol in the blood.  A procedure to widen a narrowed or blocked artery in the heart (angioplasty).  Surgery to allow blood to go around a blocked artery (coronary artery bypass surgery). Follow these instructions at home: Medicines  Take over-the-counter and prescription medicines only as told by your doctor.  Do not take these medicines unless your doctor says that you can: ? NSAIDs. These include:  Ibuprofen.  Naproxen. ? Vitamin supplements that have vitamin A, vitamin E, or  both. ? Hormone therapy that contains estrogen with or without progestin. Eating and drinking   Eat a heart-healthy diet that includes: ? Lots of fresh fruits and vegetables. ? Whole grains. ? Low-fat (lean) protein. ? Low-fat dairy products.  Follow instructions from your doctor about what you cannot eat or drink. Activity  Follow an exercise program that your doctor tells you.  Talk with your doctor about joining a program to help improve the health of your heart (cardiac rehab).  When you feel tired, take a break. Plan breaks if you know you are going to feel tired. Lifestyle   Do not use any products that contain nicotine or tobacco. This includes cigarettes, e-cigarettes, and chewing tobacco. If you need help quitting, ask your doctor.  If your doctor says you can drink alcohol: ? Limit how much you use to:  0-1 drink a day for women who are not pregnant.  0-2 drinks a day  for men. ? Be aware of how much alcohol is in your drink. In the [place] one drink equals:  One 12 oz bottle of beer (355 mL).  One 5 oz glass of wine (148 mL).  One 1 oz glass of hard liquor (44 mL). General instructions  Stay at a healthy weight. If your doctor tells you to do so, work with him or her to lose weight.  Learn to deal with stress. If you need help, ask your doctor.  Keep your vaccines up to date. Get a flu shot every year.  Talk with your doctor if you feel sad. Take a screening test to see if you are at risk for depression.  Work with your doctor to manage any other health problems that you have. These may include diabetes or high blood pressure.  Keep all follow-up visits as told by your doctor. This is important. Get help right away if:  You have pain in your chest, neck, arm, jaw, or back, and the pain: ? Lasts more than a few minutes. ? Comes back. ? Does not get better after you take medicine under your tongue (sublingual nitroglycerin). ? Keeps getting  worse. ? Comes more often.  You have any of these problems for no reason: ? Sweating a lot. ? Heartburn or upset stomach. ? Shortness of breath. ? Trouble breathing. ? Feeling sick to your stomach. ? Throwing up (vomiting). ? Feeling more tired than normal. ? Feeling nervous or worrying more than normal. ? Weakness.  You are suddenly dizzy or light-headed.  You pass out. These symptoms may be an emergency. Do not wait to see if the symptoms will go away. Get medical help right away. Call your local emergency services (911 in the [place] Do not drive yourself to the hospital. Summary  Angina is very bad discomfort or pain in the chest, neck, arm, neck, or back.  Symptoms include chest pain, heartburn or upset stomach for no reason, and shortness of breath.  Women or people with diabetes may have symptoms that are not usual, such as feeling nervous or worried for no reason, weak for no reason, or tired.  Take all medicines only as told by your doctor.  You should eat a heart-healthy diet and follow an exercise program. This information is not intended to replace advice given to you by your health care provider. Make sure you discuss any questions you have with your health care provider. Document Revised: 11/07/2017 Document Reviewed: 11/07/2017 Elsevier Patient Education  [place]. Panic Attack  A panic attack is when you suddenly feel very afraid, uncomfortable, or nervous (anxious). A panic attack can happen when you are scared or for no reason. A panic attack can feel like a serious problem. It can even feel like a heart attack or stroke. See your doctor when you have a panic attack to make sure you do not have a serious problem. Follow these instructions at home:  Take medicines only as told by your doctor.  If you feel worried or nervous, try not to have caffeine.  Take good care of your health. To do this: ? Eat healthy. Make sure to eat fresh fruits and  vegetables, whole grains, lean meats, and low-fat dairy. ? Get enough sleep. Try to sleep for 7-8 hours each night. ? Exercise. Try to be active for 30 minutes 5 or more days a week. ? Do not smoke. Talk to your doctor if you need help quitting. ? Limit how much  alcohol you drink:  If you are a woman who is not pregnant: try not to have more than 1 drink a day.  If you are a man: try not to have more than 2 drinks a day.  One drink equals 12 oz of beer, 5 oz of wine, or 1 oz of hard liquor.  Keep all follow-up visits as told by your doctor. This is important. Contact a doctor if:  Your symptoms do not get better.  Your symptoms get worse.  You are not able to take your medicines as told. Get help right away if:  You have thoughts of hurting yourself or others.  You have symptoms of a panic attack. Do not drive yourself to the hospital. Have someone else drive you or call an ambulance. If you feel like you may hurt yourself or others, or have thoughts about taking your own life, get help right away. You can go to your nearest emergency department or call:  Your local emergency services (911 in the [place]  A suicide crisis helpline, such as the National Suicide Prevention Lifeline at (870)404-52401-(712) 015-3843. This is open 24 hours a day. Summary  A panic attack is when you suddenly feel very afraid, uncomfortable, or nervous (anxious).  See your doctor when you have a panic attack to make sure that you do not have another serious problem.  If you feel like you may hurt yourself or others, get help right away by calling 911. This information is not intended to replace advice given to you by your health care provider. Make sure you discuss any questions you have with your health care provider. Document Revised: 03/04/2017 Document Reviewed: 05/05/2016 Elsevier Patient Education  2020 [employer]

## 2019-08-29 ENCOUNTER — Ambulatory Visit: Payer: BLUE CROSS/BLUE SHIELD | Admitting: Cardiovascular Disease

## 2019-08-31 ENCOUNTER — Other Ambulatory Visit: Payer: Self-pay

## 2019-08-31 ENCOUNTER — Ambulatory Visit (INDEPENDENT_AMBULATORY_CARE_PROVIDER_SITE_OTHER): Payer: 59 | Admitting: Nurse Practitioner

## 2019-08-31 ENCOUNTER — Encounter: Payer: Self-pay | Admitting: Nurse Practitioner

## 2019-08-31 VITALS — BP 142/84 | HR 74 | Temp 98.4°F | Ht 70.0 in | Wt 330.2 lb

## 2019-08-31 DIAGNOSIS — R030 Elevated blood-pressure reading, without diagnosis of hypertension: Secondary | ICD-10-CM | POA: Diagnosis not present

## 2019-08-31 DIAGNOSIS — F411 Generalized anxiety disorder: Secondary | ICD-10-CM | POA: Diagnosis not present

## 2019-08-31 NOTE — Progress Notes (Signed)
Established Patient Office Visit  Subjective:  Patient ID: Ernest Miller, male    DOB: 1976/02/12  Age: 44 y.o. MRN: [id number]  CC:  Chief Complaint  Patient presents with  . Hand Pain    left 2 week follow up     HPI [name] presents for GAD (genealized anxiety disorder) first diagnosed in 2018, patient recently started experiencing worsening symptoms. Patient is in clinic today for follow up after change in medication dose. Current medication is Buspar 7.5 mg daily. Patient reports doing well and has no new episode of anxiety or panic attacks. Patient is compliant with medication and follow up as required.      Elevated Blood pressure:   Patient presents with elevated blood pressure. patient reports blood pressure has been elevated in the past few months. patient reports it may be due to increase in weight and diet. The patient is no on any medication currently and is willing to modify diet and loose weight to reduce blood pressure.    Past Medical History:  Diagnosis Date  . Anxiety   . Complication of anesthesia    had hard time waking patient up in 2000  . Dyspnea    increased exertion   . History of kidney stones   . Kidney stone 12/26/2017    Past Surgical History:  Procedure Laterality Date  . CHOLECYSTECTOMY  11/24/2017   LAPROSCOPIC  . CHOLECYSTECTOMY N/A 11/24/2017   Procedure: LAPAROSCOPIC CHOLECYSTECTOMY;  Surgeon: Coralie Keens, MD;  Location: [facility];  Service: General;  Laterality: N/A;  . ELBOW SURGERY Right   . EXTRACORPOREAL SHOCK WAVE LITHOTRIPSY Left 10/27/2017   Procedure: LEFT EXTRACORPOREAL SHOCK WAVE LITHOTRIPSY (ESWL);  Surgeon: Cleon Gustin, MD;  Location: [facility];  Service: Urology;  Laterality: Left;  . FINGER SURGERY Right    middle  . TONSILLECTOMY    . WISDOM TOOTH EXTRACTION        Social History   Socioeconomic History  . Marital status: Married    Spouse name: Not on file  . Number of children: Not on file  .  Years of education: Not on file  . Highest education level: Not on file  Occupational History  . Not on file  Tobacco Use  . Smoking status: Never Smoker  . Smokeless tobacco: Never Used  Substance and Sexual Activity  . Alcohol use: No  . Drug use: Yes    Types: Marijuana  . Sexual activity: Yes  Other Topics Concern  . Not on file  Social History Narrative  . Not on file   Social Determinants of Health   Financial Resource Strain:   . Difficulty of Paying Living Expenses:   Food Insecurity:   . Worried About Charity fundraiser in the Last Year:   . Arboriculturist in the Last Year:   Transportation Needs:   . Film/video editor (Medical):   Marland Kitchen Lack of Transportation (Non-Medical):   Physical Activity:   . Days of Exercise per Week:   . Minutes of Exercise per Session:   Stress:   . Feeling of Stress :   Social Connections:   . Frequency of Communication with Friends and Family:   . Frequency of Social Gatherings with Friends and Family:   . Attends Religious Services:   . Active Member of Clubs or Organizations:   . Attends Archivist Meetings:   Marland Kitchen Marital Status:   Intimate Partner Violence:   . Fear of  Current or Ex-Partner:   . Emotionally Abused:   Marland Kitchen Physically Abused:   . Sexually Abused:     Outpatient Medications Prior to Visit  Medication Sig Dispense Refill  . blood glucose meter kit and supplies Dispense based on patient and insurance preference. Use up to four times daily as directed. (FOR ICD-10 E10.9, E11.9). 1 each 0  . busPIRone (BUSPAR) 7.5 MG tablet Take 1 tablet (7.5 mg total) by mouth 3 (three) times daily. 30 tablet 0  . escitalopram (LEXAPRO) 20 MG tablet Take 1 tablet (20 mg total) by mouth daily. 90 tablet 1  . omeprazole (PRILOSEC) 40 MG capsule TAKE 1 CAPSULE BY MOUTH EVERY DAY 90 capsule 4  . OneTouch Delica Lancets 64W MISC USE TO CHECK SUGAR UP TO 4 TIMES DAILY    . ONETOUCH VERIO test strip USE TO CHECK SUGAR UP TO 4  TIMES DAILY AS INSTRUCTED 100 strip 5   No facility-administered medications prior to visit.     ROS Review of Systems  Constitutional: Negative for activity change, appetite change, chills, fatigue and fever.  HENT: Negative.   Eyes: Negative.   Respiratory: Negative for chest tightness and shortness of breath.   Gastrointestinal: Negative for abdominal pain and nausea.  Genitourinary: Negative for difficulty urinating.  Musculoskeletal: Negative for arthralgias and myalgias.  Skin: Negative for pallor and rash.  Neurological: Negative.   Psychiatric/Behavioral: The patient is not nervous/anxious.       Objective:    Physical Exam  Constitutional: He is oriented to person, place, and time. He appears well-developed and well-nourished.  HENT:  Head: Normocephalic.  Eyes: Conjunctivae are normal.  Cardiovascular: Normal rate and regular rhythm.  Pulmonary/Chest: Breath sounds normal.  Abdominal: Bowel sounds are normal.  Musculoskeletal:        General: No tenderness.     Cervical back: Neck supple.  Neurological: He is alert and oriented to person, place, and time.  Skin: Skin is warm and dry.  Psychiatric: He has a normal mood and affect. His behavior is normal.  Patient is following up for medication dose change; Anxiety    BP (!) 142/84   Pulse 74   Temp 98.4 F (36.9 C) (Temporal)   Ht '5\' 10"'$  (1.778 m)   Wt (!) 330 lb 3.2 oz (149.8 kg)   SpO2 98%   BMI 47.38 kg/m  Wt Readings from Last 3 Encounters:  08/31/19 (!) 330 lb 3.2 oz (149.8 kg)  08/20/19 (!) 333 lb 3.2 oz (151.1 kg)  06/05/19 (!) 328 lb 9.6 oz (149.1 kg)   The 10-year ASCVD risk score Mikey Bussing DC Jr., et al., 2013) is: 7.3%   Values used to calculate the score:     Age: 74 years     Sex: Male     Is Non-Hispanic African American: No     Diabetic: Yes     Tobacco smoker: No     Systolic Blood Pressure: 803 mmHg     Is BP treated: No     HDL Cholesterol: 32 mg/dL     Total Cholesterol: 189  mg/dL  Health Maintenance Due  Topic Date Due  . OPHTHALMOLOGY EXAM  Never done  . COVID-19 Vaccine (1) Never done  . HIV Screening  Never done      Lab Results  Component Value Date   TSH 0.680 05/08/2019   Lab Results  Component Value Date   WBC 9.6 05/08/2019   HGB 16.2 05/08/2019   HCT 48.2 05/08/2019  MCV 90 05/08/2019   PLT 303 05/08/2019   Lab Results  Component Value Date   NA 140 06/05/2019   K 4.5 06/05/2019   CO2 21 06/05/2019   GLUCOSE 134 (H) 06/05/2019   BUN 22 06/05/2019   CREATININE 1.09 06/05/2019   BILITOT 0.2 05/08/2019   ALKPHOS 80 05/08/2019   AST 21 05/08/2019   ALT 27 05/08/2019   PROT 7.3 05/08/2019   ALBUMIN 4.4 05/08/2019   CALCIUM 9.4 06/05/2019   ANIONGAP 8 11/24/2017   Lab Results  Component Value Date   CHOL 189 05/08/2019   Lab Results  Component Value Date   HDL 32 (L) 05/08/2019   Lab Results  Component Value Date   LDLCALC 122 (H) 05/08/2019   Lab Results  Component Value Date   TRIG 194 (H) 05/08/2019   Lab Results  Component Value Date   CHOLHDL 5.9 (H) 05/08/2019   Lab Results  Component Value Date   HGBA1C 7.0 (H) 05/08/2019      Assessment & Plan:   Problem List Items Addressed This Visit      Other   GAD (generalized anxiety disorder) - Primary    General anxiety is well managed on current medication dose, no changes necessary at this time patient will follow up as needed. Provided education with printed hand out given to patient.  stress management, taking medication as prescribed,       Elevated blood pressure reading    Patient is 44 year old presents with elevated blood pressures,not well controlled.  provided education to patient on the risks of elevated blood pressure, we talked about weight loss, exercise and diet modification, we talked about patient keeping a blood pressure log and calling it back to the office in one week, patient verbalize understanding and will call in blood pressure  readings a week after he comes back from vacation.              Follow-up: Return if symptoms worsen or fail to improve.    Ivy Lynn, NP

## 2019-08-31 NOTE — Assessment & Plan Note (Signed)
General anxiety is well managed on current medication dose, no changes necessary at this time patient will follow up as needed. Provided education with printed hand out given to patient.  stress management, taking medication as prescribed,

## 2019-08-31 NOTE — Assessment & Plan Note (Signed)
Patient is 44 year old presents with elevated blood pressures,not well controlled.  provided education to patient on the risks of elevated blood pressure, we talked about weight loss, exercise and diet modification, we talked about patient keeping a blood pressure log and calling it back to the office in one week, patient verbalize understanding and will call in blood pressure readings a week after he comes back from vacation.

## 2019-08-31 NOTE — Patient Instructions (Addendum)
GAD (generalized anxiety disorder) General anxiety is well managed on current medication dose, no changes necessary at this time patient will follow up as needed. Provided education with printed hand out given to patient.  stress management, taking medication as prescribed,   Elevated blood pressure reading Patient is 44 year old presents with elevated blood pressures,not well controlled.  provided education to patient on the risks of elevated blood pressure, we talked about weight loss, exercise and diet modification, we talked about patient keeping a blood pressure log and calling it back to the office in one week, patient verbalize understanding and will call in blood pressure readings a week after he comes back from vacation.       Generalized Anxiety Disorder, Adult Generalized anxiety disorder (GAD) is a mental health disorder. People with this condition constantly worry about everyday events. Unlike normal anxiety, worry related to GAD is not triggered by a specific event. These worries also do not fade or get better with time. GAD interferes with life functions, including relationships, work, and school. GAD can vary from mild to severe. People with severe GAD can have intense waves of anxiety with physical symptoms (panic attacks). What are the causes? The exact cause of GAD is not known. What increases the risk? This condition is more likely to develop in:  Women.  People who have a family history of anxiety disorders.  People who are very shy.  People who experience very stressful life events, such as the death of a loved one.  People who have a very stressful family environment. What are the signs or symptoms? People with GAD often worry excessively about many things in their lives, such as their health and family. They may also be overly concerned about:  Doing well at work.  Being on time.  Natural disasters.  Friendships. Physical symptoms of GAD  include:  Fatigue.  Muscle tension or having muscle twitches.  Trembling or feeling shaky.  Being easily startled.  Feeling like your heart is pounding or racing.  Feeling out of breath or like you cannot take a deep breath.  Having trouble falling asleep or staying asleep.  Sweating.  Nausea, diarrhea, or irritable bowel syndrome (IBS).  Headaches.  Trouble concentrating or remembering facts.  Restlessness.  Irritability. How is this diagnosed? Your health care provider can diagnose GAD based on your symptoms and medical history. You will also have a physical exam. The health care provider will ask specific questions about your symptoms, including how severe they are, when they started, and if they come and go. Your health care provider may ask you about your use of alcohol or drugs, including prescription medicines. Your health care provider may refer you to a mental health specialist for further evaluation. Your health care provider will do a thorough examination and may perform additional tests to rule out other possible causes of your symptoms. To be diagnosed with GAD, a person must have anxiety that:  Is out of his or her control.  Affects several different aspects of his or her life, such as work and relationships.  Causes distress that makes him or her unable to take part in normal activities.  Includes at least three physical symptoms of GAD, such as restlessness, fatigue, trouble concentrating, irritability, muscle tension, or sleep problems. Before your health care provider can confirm a diagnosis of GAD, these symptoms must be present more days than they are not, and they must last for six months or longer. How is this treated?  The following therapies are usually used to treat GAD:  Medicine. Antidepressant medicine is usually prescribed for long-term daily control. Antianxiety medicines may be added in severe cases, especially when panic attacks occur.  Talk  therapy (psychotherapy). Certain types of talk therapy can be helpful in treating GAD by providing support, education, and guidance. Options include: ? Cognitive behavioral therapy (CBT). People learn coping skills and techniques to ease their anxiety. They learn to identify unrealistic or negative thoughts and behaviors and to replace them with positive ones. ? Acceptance and commitment therapy (ACT). This treatment teaches people how to be mindful as a way to cope with unwanted thoughts and feelings. ? Biofeedback. This process trains you to manage your body's response (physiological response) through breathing techniques and relaxation methods. You will work with a therapist while machines are used to monitor your physical symptoms.  Stress management techniques. These include yoga, meditation, and exercise. A mental health specialist can help determine which treatment is best for you. Some people see improvement with one type of therapy. However, other people require a combination of therapies. Follow these instructions at home:  Take over-the-counter and prescription medicines only as told by your health care provider.  Try to maintain a normal routine.  Try to anticipate stressful situations and allow extra time to manage them.  Practice any stress management or self-calming techniques as taught by your health care provider.  Do not punish yourself for setbacks or for not making progress.  Try to recognize your accomplishments, even if they are small.  Keep all follow-up visits as told by your health care provider. This is important. Contact a health care provider if:  Your symptoms do not get better.  Your symptoms get worse.  You have signs of depression, such as: ? A persistently sad, cranky, or irritable mood. ? Loss of enjoyment in activities that used to bring you joy. ? Change in weight or eating. ? Changes in sleeping habits. ? Avoiding friends or family  members. ? Loss of energy for normal tasks. ? Feelings of guilt or worthlessness. Get help right away if:  You have serious thoughts about hurting yourself or others. If you ever feel like you may hurt yourself or others, or have thoughts about taking your own life, get help right away. You can go to your nearest emergency department or call:  Your local emergency services (911 in the [place]  A suicide crisis helpline, such as the National Suicide Prevention Lifeline at 332-612-0122. This is open 24 hours a day. Summary  Generalized anxiety disorder (GAD) is a mental health disorder that involves worry that is not triggered by a specific event.  People with GAD often worry excessively about many things in their lives, such as their health and family.  GAD may cause physical symptoms such as restlessness, trouble concentrating, sleep problems, frequent sweating, nausea, diarrhea, headaches, and trembling or muscle twitching.  A mental health specialist can help determine which treatment is best for you. Some people see improvement with one type of therapy. However, other people require a combination of therapies. This information is not intended to replace advice given to you by your health care provider. Make sure you discuss any questions you have with your health care provider. Document Revised: 03/04/2017 Document Reviewed: 02/10/2016 Elsevier Patient Education  2020 [employer].  Hypertension, Adult Hypertension is another name for high blood pressure. High blood pressure forces your heart to work harder to pump blood. This can cause problems over time.  There are two numbers in a blood pressure reading. There is a top number (systolic) over a bottom number (diastolic). It is best to have a blood pressure that is below 120/80. Healthy choices can help lower your blood pressure, or you may need medicine to help lower it. What are the causes? The cause of this condition is not known.  Some conditions may be related to high blood pressure. What increases the risk?  Smoking.  Having type 2 diabetes mellitus, high cholesterol, or both.  Not getting enough exercise or physical activity.  Being overweight.  Having too much fat, sugar, calories, or salt (sodium) in your diet.  Drinking too much alcohol.  Having long-term (chronic) kidney disease.  Having a family history of high blood pressure.  Age. Risk increases with age.  Race. You may be at higher risk if you are African American.  Gender. Men are at higher risk than women before age 68. After age 86, women are at higher risk than men.  Having obstructive sleep apnea.  Stress. What are the signs or symptoms?  High blood pressure may not cause symptoms. Very high blood pressure (hypertensive crisis) may cause: ? Headache. ? Feelings of worry or nervousness (anxiety). ? Shortness of breath. ? Nosebleed. ? A feeling of being sick to your stomach (nausea). ? Throwing up (vomiting). ? Changes in how you see. ? Very bad chest pain. ? Seizures. How is this treated?  This condition is treated by making healthy lifestyle changes, such as: ? Eating healthy foods. ? Exercising more. ? Drinking less alcohol.  Your health care provider may prescribe medicine if lifestyle changes are not enough to get your blood pressure under control, and if: ? Your top number is above 130. ? Your bottom number is above 80.  Your personal target blood pressure may vary. Follow these instructions at home: Eating and drinking   If told, follow the DASH eating plan. To follow this plan: ? Fill one half of your plate at each meal with fruits and vegetables. ? Fill one fourth of your plate at each meal with whole grains. Whole grains include whole-wheat pasta, brown rice, and whole-grain bread. ? Eat or drink low-fat dairy products, such as skim milk or low-fat yogurt. ? Fill one fourth of your plate at each meal with  low-fat (lean) proteins. Low-fat proteins include fish, chicken without skin, eggs, beans, and tofu. ? Avoid fatty meat, cured and processed meat, or chicken with skin. ? Avoid pre-made or processed food.  Eat less than 1,500 mg of salt each day.  Do not drink alcohol if: ? Your doctor tells you not to drink. ? You are pregnant, may be pregnant, or are planning to become pregnant.  If you drink alcohol: ? Limit how much you use to:  0-1 drink a day for women.  0-2 drinks a day for men. ? Be aware of how much alcohol is in your drink. In the [place] one drink equals one 12 oz bottle of beer (355 mL), one 5 oz glass of wine (148 mL), or one 1 oz glass of hard liquor (44 mL). Lifestyle   Work with your doctor to stay at a healthy weight or to lose weight. Ask your doctor what the best weight is for you.  Get at least 30 minutes of exercise most days of the week. This may include walking, swimming, or biking.  Get at least 30 minutes of exercise that strengthens your muscles (resistance exercise) at least  3 days a week. This may include lifting weights or doing Pilates.  Do not use any products that contain nicotine or tobacco, such as cigarettes, e-cigarettes, and chewing tobacco. If you need help quitting, ask your doctor.  Check your blood pressure at home as told by your doctor.  Keep all follow-up visits as told by your doctor. This is important. Medicines  Take over-the-counter and prescription medicines only as told by your doctor. Follow directions carefully.  Do not skip doses of blood pressure medicine. The medicine does not work as well if you skip doses. Skipping doses also puts you at risk for problems.  Ask your doctor about side effects or reactions to medicines that you should watch for. Contact a doctor if you:  Think you are having a reaction to the medicine you are taking.  Have headaches that keep coming back (recurring).  Feel dizzy.  Have swelling in  your ankles.  Have trouble with your vision. Get help right away if you:  Get a very bad headache.  Start to feel mixed up (confused).  Feel weak or numb.  Feel faint.  Have very bad pain in your: ? Chest. ? Belly (abdomen).  Throw up more than once.  Have trouble breathing. Summary  Hypertension is another name for high blood pressure.  High blood pressure forces your heart to work harder to pump blood.  For most people, a normal blood pressure is less than 120/80.  Making healthy choices can help lower blood pressure. If your blood pressure does not get lower with healthy choices, you may need to take medicine. This information is not intended to replace advice given to you by your health care provider. Make sure you discuss any questions you have with your health care provider. Document Revised: 11/30/2017 Document Reviewed: 11/30/2017 Elsevier Patient Education  2020 [employer].

## 2019-09-04 ENCOUNTER — Ambulatory Visit: Payer: 59 | Admitting: Nurse Practitioner

## 2019-09-11 ENCOUNTER — Other Ambulatory Visit: Payer: Self-pay

## 2019-09-11 ENCOUNTER — Ambulatory Visit (INDEPENDENT_AMBULATORY_CARE_PROVIDER_SITE_OTHER): Payer: 59 | Admitting: Cardiology

## 2019-09-11 ENCOUNTER — Telehealth: Payer: Self-pay | Admitting: Cardiology

## 2019-09-11 ENCOUNTER — Encounter: Payer: Self-pay | Admitting: Cardiology

## 2019-09-11 ENCOUNTER — Encounter: Payer: Self-pay | Admitting: *Deleted

## 2019-09-11 VITALS — BP 140/98 | HR 68 | Ht 70.0 in | Wt 334.2 lb

## 2019-09-11 DIAGNOSIS — R030 Elevated blood-pressure reading, without diagnosis of hypertension: Secondary | ICD-10-CM

## 2019-09-11 DIAGNOSIS — E119 Type 2 diabetes mellitus without complications: Secondary | ICD-10-CM | POA: Diagnosis not present

## 2019-09-11 DIAGNOSIS — R0789 Other chest pain: Secondary | ICD-10-CM

## 2019-09-11 DIAGNOSIS — R072 Precordial pain: Secondary | ICD-10-CM

## 2019-09-11 NOTE — Telephone Encounter (Signed)
Pre-cert Verification for the following procedure    LEXISCAN 2 DAY PROTOCOL    DATE:    09/20/2019  LOCATION:  [facility]

## 2019-09-11 NOTE — Progress Notes (Signed)
Clinical Summary Mr. Budai is a 44 y.o.male seen today as a new consult, referred by NP Ijaola for chest pain.   1. Chest pain - left sided, pressure. 7/10 in severity at his worst. +nausea. Mild SOB. Can have Numbness down left arm elbow down all fingers except pinky. Can occur at any time. Lasts about 5-10 minutes. Not positional. Some increase in frequency, on average few times week. Can occur with feeling anxious. No relation to food  - can have some occasional DOE - pcp EKG SR, lateral TWIs.  CAD: diet controlled DM2, sister with CHF but unknown cause, father age 55 and CABG in his 84s, elevated bp   2. Generalized anxiety disorder - followed by pcp  3. DM2 - HgbA1c of 7, followed by pcp - diet controlled   Past Medical History:  Diagnosis Date  . Anxiety   . Complication of anesthesia    had hard time waking patient up in 2000  . Dyspnea    increased exertion   . History of kidney stones   . Kidney stone 12/26/2017     No Known Allergies   Current Outpatient Medications  Medication Sig Dispense Refill  . blood glucose meter kit and supplies Dispense based on patient and insurance preference. Use up to four times daily as directed. (FOR ICD-10 E10.9, E11.9). 1 each 0  . busPIRone (BUSPAR) 7.5 MG tablet Take 1 tablet (7.5 mg total) by mouth 3 (three) times daily. 30 tablet 0  . escitalopram (LEXAPRO) 20 MG tablet Take 1 tablet (20 mg total) by mouth daily. 90 tablet 1  . omeprazole (PRILOSEC) 40 MG capsule TAKE 1 CAPSULE BY MOUTH EVERY DAY 90 capsule 4  . OneTouch Delica Lancets 83M MISC USE TO CHECK SUGAR UP TO 4 TIMES DAILY    . ONETOUCH VERIO test strip USE TO CHECK SUGAR UP TO 4 TIMES DAILY AS INSTRUCTED 100 strip 5   No current facility-administered medications for this visit.     Past Surgical History:  Procedure Laterality Date  . CHOLECYSTECTOMY  11/24/2017   LAPROSCOPIC  . CHOLECYSTECTOMY N/A 11/24/2017   Procedure: LAPAROSCOPIC CHOLECYSTECTOMY;   Surgeon: Coralie Keens, MD;  Location: [facility];  Service: General;  Laterality: N/A;  . ELBOW SURGERY Right   . EXTRACORPOREAL SHOCK WAVE LITHOTRIPSY Left 10/27/2017   Procedure: LEFT EXTRACORPOREAL SHOCK WAVE LITHOTRIPSY (ESWL);  Surgeon: Cleon Gustin, MD;  Location: [facility];  Service: Urology;  Laterality: Left;  . FINGER SURGERY Right    middle  . TONSILLECTOMY    . WISDOM TOOTH EXTRACTION       No Known Allergies    No family history on file.   Social History Mr. Gaza reports that he has never smoked. He has never used smokeless tobacco. Mr. Poffenberger reports no history of alcohol use.   Review of Systems CONSTITUTIONAL: No weight loss, fever, chills, weakness or fatigue.  HEENT: Eyes: No visual loss, blurred vision, double vision or yellow sclerae.No hearing loss, sneezing, congestion, runny nose or sore throat.  SKIN: No rash or itching.  CARDIOVASCULAR: per hpi RESPIRATORY: per hpi GASTROINTESTINAL: No anorexia, nausea, vomiting or diarrhea. No abdominal pain or blood.  GENITOURINARY: No burning on urination, no polyuria NEUROLOGICAL: No headache, dizziness, syncope, paralysis, ataxia, numbness or tingling in the extremities. No change in bowel or bladder control.  MUSCULOSKELETAL: No muscle, back pain, joint pain or stiffness.  LYMPHATICS: No enlarged nodes. No history of splenectomy.  PSYCHIATRIC: No history of  depression or anxiety.  ENDOCRINOLOGIC: No reports of sweating, cold or heat intolerance. No polyuria or polydipsia.  Marland Kitchen   Physical Examination Today's Vitals   09/11/19 0848 09/11/19 0855  BP: (!) 140/100 (!) 140/98  Pulse: 68 68  SpO2: 98% 98%  Weight: (!) 334 lb 3.2 oz (151.6 kg)   Height: _0  (1.778 m)    Body mass index is 47.95 kg/m.  Gen: resting comfortably, no acute distress HEENT: no scleral icterus, pupils equal round and reactive, no palptable cervical adenopathy,  CV: RRR, no m/r/g, no jvd Resp: Clear to auscultation  bilaterally GI: abdomen is soft, non-tender, non-distended, normal bowel sounds, no hepatosplenomegaly MSK: extremities are warm, no edema.  Skin: warm, no rash Neuro:  no focal deficits Psych: appropriate affect     Assessment and Plan  1. Chest pain - mixed symptoms, some atypical features - he is diabetic. He reports to me a history of CAD in his father in his 43s/30s - his baseline EKG shows lateral TWIs  - will plan for lexiscan 2 day protocole to further evaluate   2. DM2 - given diagnosis would consider at least moderate strength statin based on ACC lipid guidelines   3. Elevated bp - working on dietary modification, he very likely may need medication within the next few follow ups.  - given his DM2 history would consider ACE or ARB if needed        Arnoldo Lenis, MD

## 2019-09-11 NOTE — Patient Instructions (Addendum)
Medication Instructions:  Continue all current medications.  Labwork: none  Testing/Procedures:  Your physician has requested that you have a lexiscan myoview. For further information please visit [email or web address] Please follow instruction sheet, as given. - 2 day protocol  Office will contact with results via phone or letter.    Follow-Up: Pending test results   Any Other Special Instructions Will Be Listed Below (If Applicable). DASH diet info given today.   If you need a refill on your cardiac medications before your next appointment, please call your pharmacy.

## 2019-09-20 ENCOUNTER — Encounter (HOSPITAL_COMMUNITY): Payer: 59

## 2019-09-21 ENCOUNTER — Encounter (HOSPITAL_COMMUNITY): Payer: 59

## 2019-11-08 ENCOUNTER — Other Ambulatory Visit: Payer: Self-pay | Admitting: Family

## 2019-11-08 DIAGNOSIS — F419 Anxiety disorder, unspecified: Secondary | ICD-10-CM

## 2019-12-07 ENCOUNTER — Ambulatory Visit (INDEPENDENT_AMBULATORY_CARE_PROVIDER_SITE_OTHER): Payer: 59

## 2019-12-07 ENCOUNTER — Other Ambulatory Visit: Payer: Self-pay

## 2019-12-07 ENCOUNTER — Ambulatory Visit (INDEPENDENT_AMBULATORY_CARE_PROVIDER_SITE_OTHER): Payer: 59 | Admitting: Family

## 2019-12-07 ENCOUNTER — Encounter: Payer: Self-pay | Admitting: Family

## 2019-12-07 VITALS — BP 151/94 | HR 72 | Temp 97.9°F | Ht 70.0 in | Wt 332.4 lb

## 2019-12-07 DIAGNOSIS — F411 Generalized anxiety disorder: Secondary | ICD-10-CM | POA: Diagnosis not present

## 2019-12-07 DIAGNOSIS — I10 Essential (primary) hypertension: Secondary | ICD-10-CM

## 2019-12-07 DIAGNOSIS — E1169 Type 2 diabetes mellitus with other specified complication: Secondary | ICD-10-CM | POA: Diagnosis not present

## 2019-12-07 DIAGNOSIS — R0602 Shortness of breath: Secondary | ICD-10-CM

## 2019-12-07 DIAGNOSIS — E119 Type 2 diabetes mellitus without complications: Secondary | ICD-10-CM | POA: Diagnosis not present

## 2019-12-07 DIAGNOSIS — Z8249 Family history of ischemic heart disease and other diseases of the circulatory system: Secondary | ICD-10-CM

## 2019-12-07 DIAGNOSIS — E785 Hyperlipidemia, unspecified: Secondary | ICD-10-CM

## 2019-12-07 LAB — BAYER DCA HB A1C WAIVED: HB A1C (BAYER DCA - WAIVED): 8.9 % — ABNORMAL HIGH (ref <7.0)

## 2019-12-07 MED ORDER — LISINOPRIL 20 MG PO TABS: 20.0000 mg | ORAL_TABLET | Freq: Every day | ORAL | 3 refills | Status: DC

## 2019-12-07 NOTE — Progress Notes (Signed)
Subjective:    Patient ID: Ernest Miller, male    DOB: [birth year], 44 y.o.   MRN: [id number]  Chief Complaint  Patient presents with  . Medical Management of Chronic Issues    need new referral for someone in his network for cardiology    PT presents to the office today for chronic follow up. States he is having a lot of fatigue that started months ago. He was recommended to see a Cardiologists, but states he went and was scheduled for a scan, but was told he was out of network.  Hypertension This is a chronic problem. The current episode started more than 1 year ago. The problem has been waxing and waning since onset. The problem is uncontrolled. Associated symptoms include anxiety, malaise/fatigue and shortness of breath. Pertinent negatives include no peripheral edema. Risk factors for coronary artery disease include dyslipidemia, obesity, male gender and sedentary lifestyle. The current treatment provides mild improvement. There is no history of CVA or heart failure.  Hyperlipidemia This is a chronic problem. The current episode started more than 1 year ago. The problem is uncontrolled. Recent lipid tests were reviewed and are high. Exacerbating diseases include obesity. Associated symptoms include shortness of breath. He is currently on no antihyperlipidemic treatment. The current treatment provides no improvement of lipids. Risk factors for coronary artery disease include dyslipidemia, hypertension, male sex and a sedentary lifestyle.  Anxiety Presents for follow-up visit. Symptoms include excessive worry, irritability, restlessness and shortness of breath.        Review of Systems  Constitutional: Positive for irritability and malaise/fatigue.  Respiratory: Positive for shortness of breath.   All other systems reviewed and are negative.      Objective:   Physical Exam Vitals reviewed.  Constitutional:      General: He is not in acute distress.    Appearance: He is  well-developed. He is obese.  HENT:     Head: Normocephalic.     Right Ear: Tympanic membrane normal.     Left Ear: Tympanic membrane normal.  Eyes:     General:        Right eye: No discharge.        Left eye: No discharge.     Pupils: Pupils are equal, round, and reactive to light.  Neck:     Thyroid: No thyromegaly.  Cardiovascular:     Rate and Rhythm: Normal rate and regular rhythm.     Heart sounds: Normal heart sounds. No murmur heard.   Pulmonary:     Effort: Pulmonary effort is normal. No respiratory distress.     Breath sounds: Normal breath sounds. No wheezing.  Abdominal:     General: Bowel sounds are normal. There is no distension.     Palpations: Abdomen is soft.     Tenderness: There is no abdominal tenderness.  Musculoskeletal:        General: No tenderness. Normal range of motion.     Cervical back: Normal range of motion and neck supple.  Skin:    General: Skin is warm and dry.     Findings: No erythema or rash.  Neurological:     Mental Status: He is alert and oriented to person, place, and time.     Cranial Nerves: No cranial nerve deficit.     Deep Tendon Reflexes: Reflexes are normal and symmetric.  Psychiatric:        Behavior: Behavior normal.        Thought Content: Thought content  normal.        Judgment: Judgment normal.       BP (!) 151/94   Pulse 72   Temp 97.9 F (36.6 C) (Temporal)   Ht $R'5\' 10"'Hg$  (1.778 m)   Wt (!) 332 lb 6.4 oz (150.8 kg)   SpO2 97%   BMI 47.69 kg/m      Assessment & Plan:  [name] comes in today with chief complaint of Medical Management of Chronic Issues (need new referral for someone in his network for cardiology )   Diagnosis and orders addressed:  1. Essential hypertension - Ambulatory referral to Cardiology - CMP14+EGFR - CBC with Differential/Platelet - lisinopril (ZESTRIL) 20 MG tablet; Take 1 tablet (20 mg total) by mouth daily.  Dispense: 90 tablet; Refill: 3  2. Diabetes mellitus without  complication ([place]) - Ambulatory referral to Cardiology - Bayer DCA Hb A1c Waived - CMP14+EGFR - CBC with Differential/Platelet  3. Hyperlipidemia associated with type 2 diabetes mellitus ([place]) - Ambulatory referral to Cardiology - CMP14+EGFR - CBC with Differential/Platelet  4. GAD (generalized anxiety disorder) - CMP14+EGFR - CBC with Differential/Platelet  5. Morbid obesity ([place]) - Ambulatory referral to Cardiology - CMP14+EGFR - CBC with Differential/Platelet  6. SOB (shortness of breath) - Ambulatory referral to Cardiology - CMP14+EGFR - CBC with Differential/Platelet - Brain natriuretic peptide - DG Chest 2 View; Future  7. Family history of cardiovascular disease - Ambulatory referral to Cardiology - CMP14+EGFR - CBC with Differential/Platelet   Labs pending Health Maintenance reviewed Diet and exercise encouraged  Follow up plan: 1 months   [name], FNP

## 2019-12-07 NOTE — Patient Instructions (Signed)
Shortness of Breath, Adult Shortness of breath is when a person has trouble breathing enough air or when a person feels like she or he is having trouble breathing in enough air. Shortness of breath could be a sign of a medical problem. Follow these instructions at home:   Pay attention to any changes in your symptoms.  Do not use any products that contain nicotine or tobacco, such as cigarettes, e-cigarettes, and chewing tobacco.  Do not smoke. Smoking is a common cause of shortness of breath. If you need help quitting, ask your health care provider.  Avoid things that can irritate your airways, such as: ? Mold. ? Dust. ? Air pollution. ? Chemical fumes. ? Things that can cause allergy symptoms (allergens), if you have allergies.  Keep your living space clean and free of mold and dust.  Rest as needed. Slowly return to your usual activities.  Take over-the-counter and prescription medicines only as told by your health care provider. This includes oxygen therapy and inhaled medicines.  Keep all follow-up visits as told by your health care provider. This is important. Contact a health care provider if:  Your condition does not improve as soon as expected.  You have a hard time doing your normal activities, even after you rest.  You have new symptoms. Get help right away if:  Your shortness of breath gets worse.  You have shortness of breath when you are resting.  You feel light-headed or you faint.  You have a cough that is not controlled with medicines.  You cough up blood.  You have pain with breathing.  You have pain in your chest, arms, shoulders, or abdomen.  You have a fever.  You cannot walk up stairs or exercise the way that you normally do. These symptoms may represent a serious problem that is an emergency. Do not wait to see if the symptoms will go away. Get medical help right away. Call your local emergency services (911 in the [place] Do not drive yourself  to the hospital. Summary  Shortness of breath is when a person has trouble breathing enough air. It can be a sign of a medical problem.  Avoid things that irritate your lungs, such as smoking, pollution, mold, and dust.  Pay attention to changes in your symptoms and contact your health care provider if you have a hard time completing daily activities because of shortness of breath. This information is not intended to replace advice given to you by your health care provider. Make sure you discuss any questions you have with your health care provider. Document Revised: 08/22/2017 Document Reviewed: 08/22/2017 Elsevier Patient Education  2020 [employer]  

## 2019-12-08 LAB — CMP14+EGFR
ALT: 46 IU/L — ABNORMAL HIGH (ref 0–44)
AST: 32 IU/L (ref 0–40)
Albumin/Globulin Ratio: 1.6 (ref 1.2–2.2)
Albumin: 4.6 g/dL (ref 4.0–5.0)
Alkaline Phosphatase: 94 IU/L (ref 48–121)
BUN/Creatinine Ratio: 15 (ref 9–20)
BUN: 17 mg/dL (ref 6–24)
Bilirubin Total: 0.4 mg/dL (ref 0.0–1.2)
CO2: 27 mmol/L (ref 20–29)
Calcium: 9.8 mg/dL (ref 8.7–10.2)
Chloride: 101 mmol/L (ref 96–106)
Creatinine, Ser: 1.11 mg/dL (ref 0.76–1.27)
GFR calc Af Amer: 92 mL/min/{1.73_m2} (ref >59)
GFR calc non Af Amer: 80 mL/min/{1.73_m2} (ref >59)
Globulin, Total: 2.8 g/dL (ref 1.5–4.5)
Glucose: 240 mg/dL — ABNORMAL HIGH (ref 65–99)
Potassium: 4.4 mmol/L (ref 3.5–5.2)
Sodium: 139 mmol/L (ref 134–144)
Total Protein: 7.4 g/dL (ref 6.0–8.5)

## 2019-12-08 LAB — CBC WITH DIFFERENTIAL/PLATELET
Basophils Absolute: 0.1 10*3/uL (ref 0.0–0.2)
Basos: 1 %
EOS (ABSOLUTE): 0.7 10*3/uL — ABNORMAL HIGH (ref 0.0–0.4)
Eos: 7 %
Hematocrit: 47.5 % (ref 37.5–51.0)
Hemoglobin: 16.2 g/dL (ref 13.0–17.7)
Immature Grans (Abs): 0 10*3/uL (ref 0.0–0.1)
Immature Granulocytes: 0 %
Lymphocytes Absolute: 2.4 10*3/uL (ref 0.7–3.1)
Lymphs: 26 %
MCH: 30.7 pg (ref 26.6–33.0)
MCHC: 34.1 g/dL (ref 31.5–35.7)
MCV: 90 fL (ref 79–97)
Monocytes Absolute: 0.9 10*3/uL (ref 0.1–0.9)
Monocytes: 9 %
Neutrophils Absolute: 5.2 10*3/uL (ref 1.4–7.0)
Neutrophils: 57 %
Platelets: 291 10*3/uL (ref 150–450)
RBC: 5.28 x10E6/uL (ref 4.14–5.80)
RDW: 12.9 % (ref 11.6–15.4)
WBC: 9.3 10*3/uL (ref 3.4–10.8)

## 2019-12-08 LAB — BRAIN NATRIURETIC PEPTIDE: BNP: 19.4 pg/mL (ref 0.0–100.0)

## 2019-12-13 ENCOUNTER — Other Ambulatory Visit: Payer: Self-pay | Admitting: Family

## 2019-12-13 MED ORDER — METFORMIN HCL ER 750 MG PO TB24: 750.0000 mg | ORAL_TABLET | Freq: Every day | ORAL | 11 refills | Status: DC

## 2019-12-14 ENCOUNTER — Telehealth: Payer: Self-pay | Admitting: Cardiology

## 2019-12-14 ENCOUNTER — Other Ambulatory Visit: Payer: Self-pay | Admitting: Family

## 2019-12-14 MED ORDER — FUROSEMIDE 20 MG PO TABS: 20.0000 mg | ORAL_TABLET | Freq: Every day | ORAL | 1 refills | Status: DC

## 2019-12-14 NOTE — Telephone Encounter (Signed)
Ernest Miller is calling stating the test Dr. Wyline Mood was requesting him to have at [facility] was going to cost over $1,00 up front and they cannot afford that. They are needing a prior authorization in regards to this so the test can be performed as soon as possible. Please advise.

## 2019-12-18 NOTE — Telephone Encounter (Signed)
Spoke with [name] in regards to having the stress test authorization so that patient can have procedure. Explained to [name] that I would check with our percert department in regards to insurance.

## 2019-12-26 ENCOUNTER — Encounter: Payer: Self-pay | Admitting: Family

## 2019-12-26 ENCOUNTER — Other Ambulatory Visit: Payer: Self-pay

## 2019-12-26 ENCOUNTER — Ambulatory Visit (INDEPENDENT_AMBULATORY_CARE_PROVIDER_SITE_OTHER): Payer: 59 | Admitting: Family

## 2019-12-26 VITALS — BP 126/81 | HR 75 | Temp 97.3°F | Ht 70.0 in | Wt 333.4 lb

## 2019-12-26 DIAGNOSIS — I10 Essential (primary) hypertension: Secondary | ICD-10-CM | POA: Diagnosis not present

## 2019-12-26 NOTE — Patient Instructions (Signed)

## 2019-12-26 NOTE — Progress Notes (Signed)
   Subjective:    Patient ID: Ernest Miller, male    DOB: 04-06-1975, 44 y.o.   MRN: [id number]  Chief Complaint  Patient presents with  . Hypertension    2 week, atorvastatin making him feel bad   Pt presents to the office today to recheck HTN.  Hypertension This is a chronic problem. The current episode started more than 1 year ago. The problem has been waxing and waning since onset. The problem is uncontrolled. Associated symptoms include malaise/fatigue and shortness of breath. Pertinent negatives include no peripheral edema. Risk factors for coronary artery disease include dyslipidemia, obesity, male gender and sedentary lifestyle. The current treatment provides moderate improvement. There is no history of CVA or heart failure.      Review of Systems  Constitutional: Positive for malaise/fatigue.  Respiratory: Positive for shortness of breath.   All other systems reviewed and are negative.      Objective:   Physical Exam Vitals reviewed.  Constitutional:      General: He is not in acute distress.    Appearance: He is well-developed. He is obese.  HENT:     Head: Normocephalic.     Right Ear: Tympanic membrane normal.     Left Ear: Tympanic membrane normal.  Eyes:     General:        Right eye: No discharge.        Left eye: No discharge.     Pupils: Pupils are equal, round, and reactive to light.  Neck:     Thyroid: No thyromegaly.  Cardiovascular:     Rate and Rhythm: Normal rate and regular rhythm.     Heart sounds: Normal heart sounds. No murmur heard.   Pulmonary:     Effort: Pulmonary effort is normal. No respiratory distress.     Breath sounds: Normal breath sounds. No wheezing.  Abdominal:     General: Bowel sounds are normal. There is no distension.     Palpations: Abdomen is soft.     Tenderness: There is no abdominal tenderness.  Musculoskeletal:        General: No tenderness. Normal range of motion.     Cervical back: Normal range of motion and neck  supple.  Skin:    General: Skin is warm and dry.     Findings: No erythema or rash.  Neurological:     Mental Status: He is alert and oriented to person, place, and time.     Cranial Nerves: No cranial nerve deficit.     Deep Tendon Reflexes: Reflexes are normal and symmetric.  Psychiatric:        Behavior: Behavior normal.        Thought Content: Thought content normal.        Judgment: Judgment normal.        BP (!) 149/85   Pulse 80   Temp (!) 97.3 F (36.3 C) (Temporal)   Ht $R'5\' 10"'Fe$  (1.778 m)   Wt (!) 333 lb 6.4 oz (151.2 kg)   SpO2 97%   BMI 47.84 kg/m   Assessment & Plan:  Ernest Miller comes in today with chief complaint of Hypertension (2 week, atorvastatin making him feel bad)   Diagnosis and orders addressed:  1. Essential hypertension At goal today -Dash diet information given -Exercise encouraged - Stress Management  -Continue current meds -RTO in 3 months and keep Cardiologists appt  - BMP8+EGFR  [name], FNP

## 2019-12-27 ENCOUNTER — Other Ambulatory Visit: Payer: Self-pay | Admitting: Family

## 2019-12-27 LAB — BMP8+EGFR
BUN/Creatinine Ratio: 19 (ref 9–20)
BUN: 19 mg/dL (ref 6–24)
CO2: 21 mmol/L (ref 20–29)
Calcium: 9.5 mg/dL (ref 8.7–10.2)
Chloride: 100 mmol/L (ref 96–106)
Creatinine, Ser: 1 mg/dL (ref 0.76–1.27)
GFR calc Af Amer: 104 mL/min/{1.73_m2} (ref >59)
GFR calc non Af Amer: 90 mL/min/{1.73_m2} (ref >59)
Glucose: 239 mg/dL — ABNORMAL HIGH (ref 65–99)
Potassium: 4.2 mmol/L (ref 3.5–5.2)
Sodium: 135 mmol/L (ref 134–144)

## 2019-12-27 MED ORDER — EMPAGLIFLOZIN 10 MG PO TABS: 10.0000 mg | ORAL_TABLET | Freq: Every day | ORAL | 2 refills | Status: DC

## 2019-12-28 ENCOUNTER — Telehealth: Payer: Self-pay | Admitting: Family

## 2019-12-28 NOTE — Telephone Encounter (Signed)
Pt aware of results and scheduled with Vanice Sarah 9/28 at 8:30 for diabetic education.

## 2020-01-01 ENCOUNTER — Other Ambulatory Visit: Payer: Self-pay

## 2020-01-01 ENCOUNTER — Ambulatory Visit (INDEPENDENT_AMBULATORY_CARE_PROVIDER_SITE_OTHER): Payer: 59 | Admitting: Pharmacist

## 2020-01-01 DIAGNOSIS — E119 Type 2 diabetes mellitus without complications: Secondary | ICD-10-CM | POA: Diagnosis not present

## 2020-01-01 MED ORDER — EMPAGLIFLOZIN 25 MG PO TABS: 25.0000 mg | ORAL_TABLET | Freq: Every day | ORAL | 3 refills | Status: DC

## 2020-01-01 NOTE — Progress Notes (Signed)
    01/01/2020 Name: Ernest Miller MRN: [id number] DOB: 05/30/1975   S:  32 yoM presents for diabetes evaluation, education, and management Patient was referred and last seen by Primary Care Provider on 12/26/19.  Insurance coverage/medication affordability: [employer]  Patient reports adherence with medications. . Current diabetes medications include: jardiance 10mg , metformin . Current hypertension medications include: lisinopril Goal 130/80 . Current hyperlipidemia medications include: atorvastatin (having myopathy/flu like symptoms)   Patient denies hypoglycemic events.   Patient reported dietary habits: Eats 2-3 meals/day (45-60g carbs per meal--3 meals a day) Discussed meal planning options and Plate method for healthy eating . Avoid sugary drinks and desserts . Incorporate balanced protein, non starchy veggies, 1 serving of carbohydrate with each meal . Increase water intake . Increase physical activity as able  The patient is asked to make an attempt to improve diet and exercise patterns to aid in medical management of this problem.   Breakfast: often skips  . Snacks: problems snacking at night  . Drinks: doesn't drink a lot, has occasional coke/sun drop; encouraged water  Patient-reported exercise habits: yard, chasing kids, active at work  Patient reports nocturia (nighttime urination).  Patient reports neuropathy (nerve pain).   Patient denies visual changes.  Patient reports self foot exams.    O:  Lab Results  Component Value Date   HGBA1C 8.9 (H) 12/07/2019   Lipid Panel     Component Value Date/Time   CHOL 189 05/08/2019 0934   TRIG 194 (H) 05/08/2019 0934   HDL 32 (L) 05/08/2019 0934   CHOLHDL 5.9 (H) 05/08/2019 0934   LDLCALC 122 (H) 05/08/2019 0934   Home fasting blood sugars: 140-173  2 hour post-meal/random blood sugars: n/a.    A/P:  Diabetes T2DM currently uncontrolled.  Patient is adherent with medication. Control is  suboptimal due to diet/lifestyle/medication optimization.  -Increased dose of SGLT2-I Jardiance (generic name empagliflozin) to 25mg  daily--patient to finish out 10mg  supply then switch Counseled Sick day rules: if sick, vomiting, have diarrhea, or  cannot drink enough fluids, you should stop taking SGLT-2 inhibitors until your symptoms go away. In rare cases, these medicines can cause diabetic ketoacidosis (DKA). DKA is acid buildup in the blood.  -Continue metformin (may increase in the future as needed)  -Decreasing to every other day atorvastatin 20mg ; patient feeling flu like   -Extensively discussed pathophysiology of diabetes, recommended lifestyle interventions, dietary effects on blood sugar control  -Counseled on s/sx of and management of hypoglycemia  -Next A1C anticipated 3 months.    Written patient instructions provided.  Total time in face to face counseling 28 minutes.   Follow up PCP Clinic Visit in 4 weeks   07/06/2019, PharmD, BCPS Clinical Pharmacist, [facility]  II Phone [phone]

## 2020-01-02 ENCOUNTER — Encounter (HOSPITAL_COMMUNITY)
Admission: RE | Admit: 2020-01-02 | Discharge: 2020-01-02 | Disposition: A | Discharge status: Home / Self Care | Payer: 59 | Source: Ambulatory Visit | Attending: Cardiology | Admitting: Cardiology

## 2020-01-02 ENCOUNTER — Encounter (HOSPITAL_BASED_OUTPATIENT_CLINIC_OR_DEPARTMENT_OTHER)
Admission: RE | Admit: 2020-01-02 | Discharge: 2020-01-02 | Disposition: A | Discharge status: Home / Self Care | Payer: 59 | Source: Ambulatory Visit | Attending: Cardiology | Admitting: Cardiology

## 2020-01-02 DIAGNOSIS — R072 Precordial pain: Secondary | ICD-10-CM | POA: Insufficient documentation

## 2020-01-02 LAB — NM MYOCAR MULTI W/SPECT W/WALL MOTION / EF
LV dias vol: 149 mL (ref 62–150)
LV sys vol: 73 mL
Peak HR: 93 {beats}/min
RATE: 0.34
Rest HR: 62 {beats}/min
SDS: 1
SRS: 2
SSS: 3
TID: 1.27

## 2020-01-02 MED ORDER — TECHNETIUM TC 99M TETROFOSMIN IV KIT
30.0000 | PACK | Freq: Once | INTRAVENOUS | Status: AC | PRN
  Administered 2020-01-02: 31 via INTRAVENOUS

## 2020-01-02 MED ORDER — REGADENOSON 0.4 MG/5ML IV SOLN
INTRAVENOUS | Status: AC
  Administered 2020-01-02: 0.4 mg via INTRAVENOUS
  Filled 2020-01-02: qty 5

## 2020-01-02 MED ORDER — SODIUM CHLORIDE FLUSH 0.9 % IV SOLN
INTRAVENOUS | Status: AC
  Administered 2020-01-02: 10 mL via INTRAVENOUS
  Filled 2020-01-02: qty 10

## 2020-01-02 MED ORDER — TECHNETIUM TC 99M TETROFOSMIN IV KIT
10.0000 | PACK | Freq: Once | INTRAVENOUS | Status: AC | PRN
  Administered 2020-01-02: 10.3 via INTRAVENOUS

## 2020-01-07 ENCOUNTER — Other Ambulatory Visit: Payer: Self-pay | Admitting: Family

## 2020-01-07 ENCOUNTER — Telehealth: Payer: Self-pay | Admitting: Cardiology

## 2020-01-07 NOTE — Telephone Encounter (Signed)
Pt calling for test results    Please call [phone]    Thanks renee

## 2020-01-07 NOTE — Telephone Encounter (Signed)
Stress test done on 9/29

## 2020-01-08 ENCOUNTER — Ambulatory Visit: Payer: 59 | Admitting: Family

## 2020-01-08 NOTE — Telephone Encounter (Signed)
Pt aware and says he is still having SOB and left arm/hand tingling and going numb    Stress test shows a very small abnormal area that may represent a small are of blockage vs normal shadowing. How are his symptoms doing as far as chest pain or SOB?    Dominga Ferry MD

## 2020-01-08 NOTE — Telephone Encounter (Signed)
Just resulted   J Coila Wardell MD 

## 2020-01-08 NOTE — Telephone Encounter (Signed)
Says stress level is down and hasn't had much chest pain

## 2020-01-09 NOTE — Telephone Encounter (Signed)
Pt aware - scheduled with Nena Polio, NP 12/6

## 2020-01-09 NOTE — Telephone Encounter (Signed)
Would monitor symptoms for now, 2 month f/u with PA   Dominga Ferry MD

## 2020-01-24 ENCOUNTER — Telehealth: Payer: Self-pay

## 2020-01-24 NOTE — Telephone Encounter (Signed)
From what I can tell in the chart, he has been on all of these medicines for quite some time, I cannot tell from the chart which ones were started new or which ones were added.  When did the rash started.  I think we need we need more information, may need to make an appointment to be seen, can be phone appointment if that is all we have for now but from what I can tell looking at the chart it looks like he has been on these medicines for at least a few months so I do not think that it would be correlated to the rash.

## 2020-01-24 NOTE — Telephone Encounter (Signed)
Patient states that one of his medication he is having a rash side effect. He states a lot of new medications where added at one time. Patient stopped all medications yesterday and would like to know which medication we think could be causing the rash. Covering PCP- please advise

## 2020-01-24 NOTE — Telephone Encounter (Signed)
Appointment scheduled.

## 2020-01-25 ENCOUNTER — Encounter: Payer: Self-pay | Admitting: Nurse Practitioner

## 2020-01-25 ENCOUNTER — Ambulatory Visit (INDEPENDENT_AMBULATORY_CARE_PROVIDER_SITE_OTHER): Payer: 59 | Admitting: Nurse Practitioner

## 2020-01-25 DIAGNOSIS — B372 Candidiasis of skin and nail: Secondary | ICD-10-CM

## 2020-01-25 MED ORDER — NYSTATIN 100000 UNIT/GM EX CREA: 1.0000 "application " | TOPICAL_CREAM | Freq: Two times a day (BID) | CUTANEOUS | 1 refills | Status: DC

## 2020-01-25 NOTE — Progress Notes (Signed)
   Virtual Visit via telephone Note Due to COVID-19 pandemic this visit was conducted virtually. This visit type was conducted due to national recommendations for restrictions regarding the COVID-19 Pandemic (e.g. social distancing, sheltering in place) in an effort to limit this patient's exposure and mitigate transmission in our community. All issues noted in this document were discussed and addressed.  A physical exam was not performed with this format.  I connected with Ernest Miller on 01/25/20 at 11:05 by telephone and verified that I am speaking with the correct person using two identifiers. Ernest Miller is currently located at home and no noe is currently with  him during visit. The provider, Mary-Margaret Daphine Deutscher, FNP is located in their office at time of visit.  I discussed the limitations, risks, security and privacy concerns of performing an evaluation and management service by telephone and the availability of in person appointments. I also discussed with the patient that there may be a patient responsible charge related to this service. The patient expressed understanding and agreed to proceed.   History and Present Illness:   Chief Complaint: yeast infection  HPI Patient was recently started on jardiance for diabetes. Since starting he has develop a rash in groin area. Very moist and itchy. He has tried some cortisone.   Review of Systems  Constitutional: Negative.   HENT: Negative.   Cardiovascular: Negative.   Genitourinary: Negative.   Neurological: Negative.   Psychiatric/Behavioral: Negative.   All other systems reviewed and are negative.    Observations/Objective: Alert and oriented- answers all questions appropriately No distress Patient describes erythematous, moist itchy rash in bil groin area.   Assessment and Plan: [name] in today with chief complaint of Rash   1. Cutaneous candidiasis Apply nystatin cream to area and dry with hair  dryer Avoid scratching' Keep area as dry as possible. May use gold bond if needed If continues - follow up with PCP to possibly change jardiance     Follow Up Instructions: prn    I discussed the assessment and treatment plan with the patient. The patient was provided an opportunity to ask questions and all were answered. The patient agreed with the plan and demonstrated an understanding of the instructions.   The patient was advised to call back or seek an in-person evaluation if the symptoms worsen or if the condition fails to improve as anticipated.  The above assessment and management plan was discussed with the patient. The patient verbalized understanding of and has agreed to the management plan. Patient is aware to call the clinic if symptoms persist or worsen. Patient is aware when to return to the clinic for a follow-up visit. Patient educated on when it is appropriate to go to the emergency department.   Time call ended:  11:17  I provided 17 minutes of non-face-to-face time during this encounter.    Mary-Margaret Daphine Deutscher, FNP

## 2020-01-29 ENCOUNTER — Other Ambulatory Visit: Payer: Self-pay | Admitting: Family

## 2020-01-31 ENCOUNTER — Other Ambulatory Visit: Payer: Self-pay

## 2020-01-31 ENCOUNTER — Encounter: Payer: Self-pay | Admitting: Family

## 2020-01-31 ENCOUNTER — Ambulatory Visit (INDEPENDENT_AMBULATORY_CARE_PROVIDER_SITE_OTHER): Payer: 59 | Admitting: Family

## 2020-01-31 VITALS — BP 136/85 | HR 74 | Temp 97.5°F | Ht 70.0 in | Wt 324.6 lb

## 2020-01-31 DIAGNOSIS — E1169 Type 2 diabetes mellitus with other specified complication: Secondary | ICD-10-CM

## 2020-01-31 DIAGNOSIS — I10 Essential (primary) hypertension: Secondary | ICD-10-CM

## 2020-01-31 MED ORDER — METFORMIN HCL ER 750 MG PO TB24: 1500.0000 mg | ORAL_TABLET | Freq: Every day | ORAL | 2 refills | Status: DC

## 2020-01-31 NOTE — Patient Instructions (Signed)

## 2020-01-31 NOTE — Progress Notes (Signed)
Subjective:    Patient ID: Ernest Miller, male    DOB: Aug 04, 1975, 44 y.o.   MRN: [id number]  Chief Complaint  Patient presents with  . Hypertension  . Diabetes    JARDIANCE CAUSED RASH   Pt presents to the office recheck DM and HTN.  Hypertension This is a chronic problem. The current episode started more than 1 year ago. The problem has been resolved since onset. The problem is controlled. Pertinent negatives include no blurred vision, malaise/fatigue, peripheral edema or shortness of breath. Risk factors for coronary artery disease include diabetes mellitus, dyslipidemia, obesity and male gender. The current treatment provides moderate improvement. There is no history of CVA or heart failure.  Diabetes He presents for his follow-up diabetic visit. He has type 2 diabetes mellitus. His disease course has been stable. There are no hypoglycemic associated symptoms. Pertinent negatives for diabetes include no blurred vision and no foot paresthesias. Symptoms are stable. Diabetic complications include heart disease. Pertinent negatives for diabetic complications include no CVA. Risk factors for coronary artery disease include dyslipidemia, diabetes mellitus, sedentary lifestyle and hypertension. He is following a generally unhealthy diet. His overall blood glucose range is 140-180 mg/dl. An ACE inhibitor/angiotensin II receptor blocker is being taken. Eye exam is current.      Review of Systems  Constitutional: Negative for malaise/fatigue.  Eyes: Negative for blurred vision.  Respiratory: Negative for shortness of breath.   All other systems reviewed and are negative.      Objective:   Physical Exam Vitals reviewed.  Constitutional:      General: He is not in acute distress.    Appearance: He is well-developed. He is obese.  HENT:     Head: Normocephalic.  Eyes:     General:        Right eye: No discharge.        Left eye: No discharge.     Pupils: Pupils are equal, round, and  reactive to light.  Neck:     Thyroid: No thyromegaly.  Cardiovascular:     Rate and Rhythm: Normal rate and regular rhythm.     Heart sounds: Normal heart sounds. No murmur heard.   Pulmonary:     Effort: Pulmonary effort is normal. No respiratory distress.     Breath sounds: Normal breath sounds. No wheezing.  Abdominal:     General: Bowel sounds are normal. There is no distension.     Palpations: Abdomen is soft.     Tenderness: There is no abdominal tenderness.  Musculoskeletal:        General: No tenderness. Normal range of motion.     Cervical back: Normal range of motion and neck supple.  Skin:    General: Skin is warm and dry.     Findings: No erythema or rash.  Neurological:     Mental Status: He is alert and oriented to person, place, and time.     Cranial Nerves: No cranial nerve deficit.     Deep Tendon Reflexes: Reflexes are normal and symmetric.  Psychiatric:        Behavior: Behavior normal.        Thought Content: Thought content normal.        Judgment: Judgment normal.       BP 136/85   Pulse 74   Temp (!) 97.5 F (36.4 C) (Temporal)   Ht $R'5\' 10"'IE$  (1.778 m)   Wt (!) 324 lb 9.6 oz (147.2 kg)   SpO2 98%  BMI 46.58 kg/m      Assessment & Plan:  Ernest Miller comes in today with chief complaint of Hypertension and Diabetes ([place])   Diagnosis and orders addressed:  1. Primary hypertension - BMP8+EGFR  2. Type 2 diabetes mellitus with other specified complication, without long-term current use of insulin (HCC) Will increase metformin to 1500 mg from 750 mg Strict low carb diet - metFORMIN (GLUCOPHAGE XR) 750 MG 24 hr tablet; Take 2 tablets (1,500 mg total) by mouth daily with breakfast.  Dispense: 180 tablet; Refill: 2 - BMP8+EGFR  3. Morbid obesity ([place]) - BMP8+EGFR   Labs pending Health Maintenance reviewed Diet and exercise encouraged  Follow up plan: 2 months   [name], FNP

## 2020-02-13 ENCOUNTER — Other Ambulatory Visit: Payer: Self-pay | Admitting: Family

## 2020-03-09 NOTE — Progress Notes (Signed)
Cardiology Office Note  Date: 03/10/2020   ID: Ernest Miller, DOB Jan 30, 1976, MRN [id number]  PCP:  Sharion Balloon, FNP  Cardiologist:  Carlyle Dolly, MD Electrophysiologist:  None   Chief Complaint: Follow CP, elevated BP, Type 2 DM  History of Present Illness: Ernest Miller is a 44 y.o. male with a history of chest pain, elevated blood pressure, type 2 diabetes, dyspnea on exertion.  Last encounter with Dr. Harl Bowie on 09/11/2019.  Pain was left-sided/pressure-like/7 out of 10 in severity at worst, plus nausea, mild shortness of breath lasting 5 to 10 minutes.  Some increased frequency.  Could occur with anxiousness, EKG with lateral due to TWIs.  Generalized anxiety followed by PCP.  DM2 hemoglobin A1c of 7.  Followed by PCP, diet controlled.  Lexiscan stress ordered.  Given elevated blood pressure and DM2 will consider ACE or ARB if needed.  Would add moderate strength statin based on ACC lipid guidelines.    Recent stress test results were considered intermediate.  Patient denies any recent chest pain since starting antihypertensive medication.  However he states he is developed a dry hacking cough.  Also states the atorvastatin gave him headaches.  He stopped this on his own.  He denies any current anginal or exertional symptoms.  Blood pressures slightly elevated today but he has not taken his antihypertensive medication in the last 2 days.  He denies any other issues.   Past Medical History:  Diagnosis Date  . Anxiety   . Complication of anesthesia    had hard time waking patient up in 2000  . Dyspnea    increased exertion   . History of kidney stones   . Kidney stone 12/26/2017    Past Surgical History:  Procedure Laterality Date  . CHOLECYSTECTOMY  11/24/2017   LAPROSCOPIC  . CHOLECYSTECTOMY N/A 11/24/2017   Procedure: LAPAROSCOPIC CHOLECYSTECTOMY;  Surgeon: Coralie Keens, MD;  Location: [facility];  Service: General;  Laterality: N/A;  . ELBOW SURGERY Right   .  EXTRACORPOREAL SHOCK WAVE LITHOTRIPSY Left 10/27/2017   Procedure: LEFT EXTRACORPOREAL SHOCK WAVE LITHOTRIPSY (ESWL);  Surgeon: Cleon Gustin, MD;  Location: [facility];  Service: Urology;  Laterality: Left;  . FINGER SURGERY Right    middle  . TONSILLECTOMY    . WISDOM TOOTH EXTRACTION      Current Outpatient Medications  Medication Sig Dispense Refill  . blood glucose meter kit and supplies Dispense based on patient and insurance preference. Use up to four times daily as directed. (FOR ICD-10 E10.9, E11.9). 1 each 0  . busPIRone (BUSPAR) 7.5 MG tablet Take 1 tablet (7.5 mg total) by mouth 3 (three) times daily. 30 tablet 0  . escitalopram (LEXAPRO) 20 MG tablet TAKE 1 TABLET BY MOUTH EVERY DAY 90 tablet 0  . furosemide (LASIX) 20 MG tablet TAKE 1 TABLET BY MOUTH EVERY DAY 90 tablet 1  . metFORMIN (GLUCOPHAGE XR) 750 MG 24 hr tablet Take 2 tablets (1,500 mg total) by mouth daily with breakfast. 180 tablet 2  . nystatin cream (MYCOSTATIN) Apply 1 application topically 2 (two) times daily. 30 g 1  . omeprazole (PRILOSEC) 40 MG capsule TAKE 1 CAPSULE BY MOUTH EVERY DAY 90 capsule 4  . OneTouch Delica Lancets 84T MISC USE TO CHECK SUGAR UP TO 4 TIMES DAILY    . ONETOUCH VERIO test strip USE TO CHECK SUGAR UP TO 4 TIMES DAILY AS INSTRUCTED 100 strip 5  . losartan (COZAAR) 25 MG tablet Take 1  tablet (25 mg total) by mouth daily. 90 tablet 1   No current facility-administered medications for this visit.   Allergies:  Jardiance [empagliflozin]   Social History: The patient  reports that he has never smoked. He has never used smokeless tobacco. He reports current drug use. Drug: Marijuana. He reports that he does not drink alcohol.   Family History: The patient's family history is not on file.   ROS:  Please see the history of present illness. Otherwise, complete review of systems is positive for none.  All other systems are reviewed and negative.   Physical Exam: VS:  BP 132/84   Pulse 82    Ht _0  (1.778 m)   Wt (!) 323 lb (146.5 kg)   SpO2 94%   BMI 46.35 kg/m , BMI Body mass index is 46.35 kg/m.  Wt Readings from Last 3 Encounters:  03/10/20 (!) 323 lb (146.5 kg)  01/31/20 (!) 324 lb 9.6 oz (147.2 kg)  12/26/19 (!) 333 lb 6.4 oz (151.2 kg)    General: Morbidly obese patient appears comfortable at rest. Neck: Supple, no elevated JVP or carotid bruits, no thyromegaly. Lungs: Clear to auscultation, nonlabored breathing at rest. Cardiac: Regular rate and rhythm, no S3 or significant systolic murmur, no pericardial rub. Extremities: No pitting edema, distal pulses 2+. Skin: Warm and dry. Musculoskeletal: No kyphosis. Neuropsychiatric: Alert and oriented x3, affect grossly appropriate.  ECG:  EKG 08/20/2019 sinus rhythm rate of 72, T abnormality, lateral ischemia.  T wave inversion noted in aVL  Recent Labwork: 05/08/2019: TSH 0.680 12/07/2019: ALT 46; AST 32; BNP 19.4; Hemoglobin 16.2; Platelets 291 12/26/2019: BUN 19; Creatinine, Ser 1.00; Potassium 4.2; Sodium 135     Component Value Date/Time   CHOL 189 05/08/2019 0934   TRIG 194 (H) 05/08/2019 0934   HDL 32 (L) 05/08/2019 0934   CHOLHDL 5.9 (H) 05/08/2019 0934   LDLCALC 122 (H) 05/08/2019 0934    Other Studies Reviewed Today:  Lexiscan nuclear stress test 01/02/2020  No diagnostic ST segment changes to indicate ischemia.  Very small, apical anteroseptal defect that is reversible. Unusual distribution for major epicardial vessel but cannot exclude a small ischemic territory. TID ratio 1.27, consider the possibility of balanced subendocardial ischemia as well in the setting of type 2 diabetes mellitus.  This is an intermediate risk study.  Nuclear stress EF: 51%.  Assessment and Plan:  1. Other chest pain   2. Essential hypertension   3. Type 2 diabetes mellitus without complication, without long-term current use of insulin ([place])   4. Hyperlipidemia associated with type 2 diabetes mellitus ([place])    1.  Other chest pain Currently denies any anginal or exertional symptoms.  States chest pain much better since starting antihypertensive medication.  We discussed the results of his stress test being an intermediate risk stress test.  He does state he has developed a dry hacking cough on the lisinopril.  Will DC lisinopril and start losartan 25 mg p.o. daily.  He states he stopped his atorvastatin due to headaches.  We will try simvastatin 20 mg daily.  Get fasting lipid profile and LFTs and 8 weeks after starting simvastatin.  Continue aspirin 81 mg daily,  2. Essential hypertension He states he has developed a dry hacking cough on the lisinopril.  Will DC lisinopril and start losartan 25 mg p.o. daily.  Blood pressure today slightly elevated but patient has not taken his antihypertensive medication in 2 days.  Get a follow-up BMP and magnesium  in 2 weeks.   3. Type 2 diabetes mellitus without complication, without long-term current use of insulin ([place]) States diabetes is reasonably well controlled.  Managed by PCP  Medication Adjustments/Labs and Tests Ordered: Current medicines are reviewed at length with the patient today.  Concerns regarding medicines are outlined above.   Disposition: Follow-up with Dr. Harl Bowie or APP 6 months.  Signed, Levell July, NP 03/10/2020 8:25 AM    [facility] [place], [place], [place] [address] Phone: [phone]; Fax: [phone]

## 2020-03-10 ENCOUNTER — Ambulatory Visit (INDEPENDENT_AMBULATORY_CARE_PROVIDER_SITE_OTHER): Payer: 59 | Admitting: Family Medicine

## 2020-03-10 ENCOUNTER — Encounter: Payer: Self-pay | Admitting: Family Medicine

## 2020-03-10 VITALS — BP 132/84 | HR 82 | Ht 70.0 in | Wt 323.0 lb

## 2020-03-10 DIAGNOSIS — E119 Type 2 diabetes mellitus without complications: Secondary | ICD-10-CM

## 2020-03-10 DIAGNOSIS — R0789 Other chest pain: Secondary | ICD-10-CM

## 2020-03-10 DIAGNOSIS — E1169 Type 2 diabetes mellitus with other specified complication: Secondary | ICD-10-CM | POA: Diagnosis not present

## 2020-03-10 DIAGNOSIS — I10 Essential (primary) hypertension: Secondary | ICD-10-CM

## 2020-03-10 DIAGNOSIS — E785 Hyperlipidemia, unspecified: Secondary | ICD-10-CM

## 2020-03-10 MED ORDER — SIMVASTATIN 20 MG PO TABS: 20.0000 mg | ORAL_TABLET | Freq: Every day | ORAL | 1 refills | Status: DC

## 2020-03-10 MED ORDER — LOSARTAN POTASSIUM 25 MG PO TABS: 25.0000 mg | ORAL_TABLET | Freq: Every day | ORAL | 1 refills | Status: DC

## 2020-03-10 NOTE — Patient Instructions (Addendum)
Your physician wants you to follow-up in: 6 MONTHS WITH DR [facility] You will receive a reminder letter in the mail two months in advance. If you don't receive a letter, please call our office to schedule the follow-up appointment.  Your physician has recommended you make the following change in your medication:   STOP LISINOPRIL   START LOSARTAN 25 MG DAILY   START SIMVASTATIN 20 MG DAILY   Your physician recommends that you return for lab work in: 2 WEEKS LIPIDS/LFTS/BMP/MG  Thank you for choosing [facility]!!

## 2020-04-08 ENCOUNTER — Telehealth: Payer: Self-pay

## 2020-04-08 NOTE — Telephone Encounter (Signed)
Patient reports his blood sugars have been elevated since Sunday.  He wasn't feeling well Sunday evening and checked and it was 296.  It has been holding steady between 280-300 since then.  This morning he checked it fasting and it was 320.  He took his Metformin and ate and rechecked it at 286.  What should he do?  He uses [facility].

## 2020-04-10 MED ORDER — OZEMPIC (0.25 OR 0.5 MG/DOSE) 2 MG/1.5ML ~~LOC~~ SOPN: PEN_INJECTOR | SUBCUTANEOUS | 2 refills | Status: DC

## 2020-04-10 NOTE — Telephone Encounter (Signed)
Ozempic Prescription sent to pharmacy, this is a once weekly injection. Continue to monitor glucose. Needs follow up with me as soon as possible.

## 2020-04-10 NOTE — Telephone Encounter (Signed)
Left message to call back  

## 2020-04-11 ENCOUNTER — Other Ambulatory Visit: Payer: Self-pay | Admitting: Family

## 2020-04-11 ENCOUNTER — Ambulatory Visit (INDEPENDENT_AMBULATORY_CARE_PROVIDER_SITE_OTHER): Payer: 59 | Admitting: Family

## 2020-04-11 ENCOUNTER — Encounter: Payer: Self-pay | Admitting: Family

## 2020-04-11 ENCOUNTER — Other Ambulatory Visit: Payer: Self-pay

## 2020-04-11 VITALS — BP 122/73 | HR 68 | Temp 97.8°F | Ht 70.0 in | Wt 323.6 lb

## 2020-04-11 DIAGNOSIS — E1169 Type 2 diabetes mellitus with other specified complication: Secondary | ICD-10-CM | POA: Diagnosis not present

## 2020-04-11 DIAGNOSIS — I10 Essential (primary) hypertension: Secondary | ICD-10-CM | POA: Diagnosis not present

## 2020-04-11 DIAGNOSIS — E785 Hyperlipidemia, unspecified: Secondary | ICD-10-CM | POA: Diagnosis not present

## 2020-04-11 LAB — BAYER DCA HB A1C WAIVED: HB A1C (BAYER DCA - WAIVED): 11.3 % — ABNORMAL HIGH (ref <7.0)

## 2020-04-11 MED ORDER — LISINOPRIL 20 MG PO TABS: 20.0000 mg | ORAL_TABLET | Freq: Every day | ORAL | 3 refills | Status: DC

## 2020-04-11 MED ORDER — FREESTYLE LIBRE SENSOR SYSTEM MISC
6 refills | Status: DC
Start: 2020-04-11 — End: 2020-09-24

## 2020-04-11 MED ORDER — ATORVASTATIN CALCIUM 20 MG PO TABS: 20.0000 mg | ORAL_TABLET | Freq: Every day | ORAL | 4 refills | Status: DC

## 2020-04-11 NOTE — Patient Instructions (Signed)

## 2020-04-11 NOTE — Progress Notes (Signed)
Subjective:    Patient ID: Ernest Miller, male    DOB: 1975-05-03, 45 y.o.   MRN: [id number]  Chief Complaint  Patient presents with  . Diabetes    Sugars all over the place    Pt presents to the office today with complaints of elevated blood sugars.  Diabetes He presents for his follow-up diabetic visit. He has type 2 diabetes mellitus. There are no hypoglycemic associated symptoms. Pertinent negatives for diabetes include no blurred vision and no foot paresthesias. Symptoms are stable. Diabetic complications include heart disease. Pertinent negatives for diabetic complications include no CVA, nephropathy or peripheral neuropathy. Risk factors for coronary artery disease include dyslipidemia, diabetes mellitus, hypertension, male sex and sedentary lifestyle. He is following a generally unhealthy diet. He participates in exercise weekly. His overall blood glucose range is 180-200 mg/dl. An ACE inhibitor/angiotensin II receptor blocker is being taken.  Hypertension This is a chronic problem. The current episode started more than 1 year ago. The problem has been resolved since onset. The problem is controlled. Associated symptoms include malaise/fatigue and shortness of breath. Pertinent negatives include no blurred vision or peripheral edema. Risk factors for coronary artery disease include dyslipidemia, obesity and sedentary lifestyle. The current treatment provides moderate improvement. There is no history of CVA or heart failure.  Hyperlipidemia This is a chronic problem. The current episode started more than 1 year ago. The problem is controlled. Exacerbating diseases include obesity. Associated symptoms include shortness of breath. Current antihyperlipidemic treatment includes statins. The current treatment provides moderate improvement of lipids. Risk factors for coronary artery disease include diabetes mellitus, dyslipidemia, male sex, hypertension, a sedentary lifestyle and obesity.       Review of Systems  Constitutional: Positive for malaise/fatigue.  Eyes: Negative for blurred vision.  Respiratory: Positive for shortness of breath.   All other systems reviewed and are negative.      Objective:   Physical Exam Vitals reviewed.  Constitutional:      General: He is not in acute distress.    Appearance: He is well-developed and well-nourished. He is obese.  HENT:     Head: Normocephalic.     Right Ear: Tympanic membrane normal.     Left Ear: Tympanic membrane normal.     Mouth/Throat:     Mouth: Oropharynx is clear and moist.  Eyes:     General:        Right eye: No discharge.        Left eye: No discharge.     Pupils: Pupils are equal, round, and reactive to light.  Neck:     Thyroid: No thyromegaly.  Cardiovascular:     Rate and Rhythm: Normal rate and regular rhythm.     Pulses: Intact distal pulses.     Heart sounds: Normal heart sounds. No murmur heard.   Pulmonary:     Effort: Pulmonary effort is normal. No respiratory distress.     Breath sounds: Normal breath sounds. No wheezing.  Abdominal:     General: Bowel sounds are normal. There is no distension.     Palpations: Abdomen is soft.     Tenderness: There is no abdominal tenderness.  Musculoskeletal:        General: No tenderness or edema. Normal range of motion.     Cervical back: Normal range of motion and neck supple.  Skin:    General: Skin is warm and dry.     Findings: No erythema or rash.  Neurological:  Mental Status: He is alert and oriented to person, place, and time.     Cranial Nerves: No cranial nerve deficit.     Deep Tendon Reflexes: Reflexes are normal and symmetric.  Psychiatric:        Mood and Affect: Mood and affect normal.        Behavior: Behavior normal.        Thought Content: Thought content normal.        Judgment: Judgment normal.     BP 122/73   Pulse 68   Temp 97.8 F (36.6 C) (Temporal)   Ht $R'5\' 10"'pS$  (1.778 m)   Wt (!) 323 lb 9.6 oz (146.8  kg)   BMI 46.43 kg/m      Assessment & Plan:  Ernest Miller comes in today with chief complaint of Diabetes (Sugars all over the place )   Diagnosis and orders addressed:  1. Type 2 diabetes mellitus with other specified complication, without long-term current use of insulin ([facility]) - atorvastatin (LIPITOR) 20 MG tablet; Take 1 tablet (20 mg total) by mouth daily.  Dispense: 90 tablet; Refill: 4 - CMP14+EGFR - Bayer DCA Hb A1c Waived - Microalbumin / creatinine urine ratio - Continuous Blood Gluc Sensor ([place]) MISC; Needs to check BS QID  Dispense: 1 each; Refill: 6  2. Hyperlipidemia associated with type 2 diabetes mellitus (HCC) - atorvastatin (LIPITOR) 20 MG tablet; Take 1 tablet (20 mg total) by mouth daily.  Dispense: 90 tablet; Refill: 4 - CMP14+EGFR  3. Primary hypertension - CMP14+EGFR  4. Essential hypertension - lisinopril (ZESTRIL) 20 MG tablet; Take 1 tablet (20 mg total) by mouth daily.  Dispense: 90 tablet; Refill: 3 - CMP14+EGFR  5. Morbid obesity ([place])   PT states he has continued his Lipitor and Lisinopril and never changed his medications after seeing Cardiologists. (They had changed it to Zocor and Losartan). He wants to continue the medication he was already taking. I updated Epic.  Labs pending Health Maintenance reviewed Diet and exercise encouraged  Follow up plan: 2 months and make appt with Almyra Free for DM education    [name], [place]

## 2020-04-12 LAB — MICROALBUMIN / CREATININE URINE RATIO
Creatinine, Urine: 104.7 mg/dL
Microalb/Creat Ratio: 8 mg/g creat (ref 0–29)
Microalbumin, Urine: 8.1 ug/mL

## 2020-04-12 LAB — CMP14+EGFR
ALT: 52 IU/L — ABNORMAL HIGH (ref 0–44)
AST: 27 IU/L (ref 0–40)
Albumin/Globulin Ratio: 1.5 (ref 1.2–2.2)
Albumin: 4.5 g/dL (ref 4.0–5.0)
Alkaline Phosphatase: 93 IU/L (ref 44–121)
BUN/Creatinine Ratio: 19 (ref 9–20)
BUN: 21 mg/dL (ref 6–24)
Bilirubin Total: 0.2 mg/dL (ref 0.0–1.2)
CO2: 28 mmol/L (ref 20–29)
Calcium: 10.1 mg/dL (ref 8.7–10.2)
Chloride: 98 mmol/L (ref 96–106)
Creatinine, Ser: 1.12 mg/dL (ref 0.76–1.27)
GFR calc Af Amer: 91 mL/min/{1.73_m2} (ref >59)
GFR calc non Af Amer: 78 mL/min/{1.73_m2} (ref >59)
Globulin, Total: 3 g/dL (ref 1.5–4.5)
Glucose: 127 mg/dL — ABNORMAL HIGH (ref 65–99)
Potassium: 4.7 mmol/L (ref 3.5–5.2)
Sodium: 137 mmol/L (ref 134–144)
Total Protein: 7.5 g/dL (ref 6.0–8.5)

## 2020-04-14 ENCOUNTER — Other Ambulatory Visit: Payer: Self-pay | Admitting: *Deleted

## 2020-04-14 NOTE — Telephone Encounter (Signed)
Pharmacy comment:  Alternative Requested:NOT COVERED.Marland KitchenMarland KitchenALTERNATIVE REQUESTED.

## 2020-04-15 ENCOUNTER — Telehealth: Payer: Self-pay

## 2020-04-15 MED ORDER — BUSPIRONE HCL 7.5 MG PO TABS: 7.5000 mg | ORAL_TABLET | Freq: Three times a day (TID) | ORAL | 0 refills | Status: DC

## 2020-04-15 MED ORDER — TRULICITY 0.75 MG/0.5ML ~~LOC~~ SOAJ
0.7500 mg | SUBCUTANEOUS | 3 refills | Status: DC
Start: 2020-04-15 — End: 2020-04-21

## 2020-04-15 NOTE — Telephone Encounter (Signed)
Truicity Prescription sent to pharmacy

## 2020-04-15 NOTE — Telephone Encounter (Signed)
Insurance has denied ozempic must try trulicity or victoza

## 2020-04-15 NOTE — Telephone Encounter (Signed)
Patient aware and verbalized understanding. °

## 2020-04-19 ENCOUNTER — Other Ambulatory Visit: Payer: Self-pay | Admitting: *Deleted

## 2020-04-19 MED ORDER — ONETOUCH VERIO VI STRP: ORAL_STRIP | 5 refills | Status: DC

## 2020-04-21 ENCOUNTER — Other Ambulatory Visit: Payer: Self-pay

## 2020-04-21 ENCOUNTER — Ambulatory Visit (INDEPENDENT_AMBULATORY_CARE_PROVIDER_SITE_OTHER): Payer: 59 | Admitting: Pharmacist

## 2020-04-21 DIAGNOSIS — E119 Type 2 diabetes mellitus without complications: Secondary | ICD-10-CM | POA: Diagnosis not present

## 2020-04-21 MED ORDER — TRULICITY 1.5 MG/0.5ML ~~LOC~~ SOAJ: 1.5000 mg | SUBCUTANEOUS | 3 refills | Status: DC

## 2020-04-21 NOTE — Progress Notes (Signed)
    04/21/2020 Name: Ernest Miller MRN: [id number] DOB: 01-01-1976   S:  45 yoM Presents for diabetes evaluation, education, and management Patient was referred and last seen by Primary Care Provider on 04/11/20.  Patient visit virtually in the context of Covid-19 pandemic.   I connected with DON TIU  on 04/21/20 at 9:20am by video and verified that I am speaking with the correct person using two identifiers.   I discussed the limitations, risks, security and privacy concerns of performing an evaluation and management service by video and the availability of in person appointments. I also discussed with the patient that there may be a patient responsible charge related to this service. The patient expressed understanding and agreed to proceed.   Patient location:  home My Location:  home Persons on the video call:  2  Insurance coverage/medication affordability: [employer]  Patient reports adherence with medications. . Current diabetes medications include: trulicity, metformin . Current hypertension medications include: lisinopril Goal 130/80 . Current hyperlipidemia medications include: atorvastatin  Patient denies hypoglycemic events.  Discussed meal planning options and Plate method for healthy eating . Avoid sugary drinks and desserts . Incorporate balanced protein, non starchy veggies, 1 serving of carbohydrate with each meal . Increase water intake . Increase physical activity as able    O:  Lab Results  Component Value Date   HGBA1C 11.3 (H) 04/11/2020    There were no vitals filed for this visit.     Lipid Panel     Component Value Date/Time   CHOL 189 05/08/2019 0934   TRIG 194 (H) 05/08/2019 0934   HDL 32 (L) 05/08/2019 0934   CHOLHDL 5.9 (H) 05/08/2019 0934   LDLCALC 122 (H) 05/08/2019 0934   Home fasting blood sugars: <200  2 hour post-meal/random blood sugars: n/a.--patient to set up Ernest Miller   The 10-year ASCVD risk score Ernest Miller DC Montez Ernest Miller., et  al., 2013) is: 7.2%   Values used to calculate the score:     Age: 45 years     Sex: Male     Is Non-Hispanic African American: No     Diabetic: Yes     Tobacco smoker: No     Systolic Blood Pressure: 122 mmHg     Is BP treated: Yes     HDL Cholesterol: 32 mg/dL     Total Cholesterol: 189 mg/dL    A/P:  Diabetes C4UG currently uncontrolled but improving. Patient is adherent with medication.   -Stopped jardiance due to side effects  -Increase trulicity to 1.5mg  sq weekly next month (RX called in)--continue trulicity 0.75mg  sq weekly until 4 weeks completed  Denies history of thyroid cancer  -Patient to set up libre--gave instructions  -Continue metformin-GFR 78  -Extensively discussed pathophysiology of diabetes, recommended lifestyle interventions, dietary effects on blood sugar control  -Counseled on s/sx of and management of hypoglycemia  -Next A1C anticipated 3 months  Written patient instructions provided.  Total time in counseling 17 minutes.   Follow up PCP Clinic Visit in 3 months.   Ernest Miller, PharmD, BCPS Clinical Pharmacist, [facility]  II Phone [phone]

## 2020-04-21 NOTE — Telephone Encounter (Signed)
Patient seen since telephone encounter.  This encounter will now be closed. 

## 2020-05-22 ENCOUNTER — Other Ambulatory Visit: Payer: Self-pay | Admitting: Family

## 2020-05-22 DIAGNOSIS — K219 Gastro-esophageal reflux disease without esophagitis: Secondary | ICD-10-CM

## 2020-06-04 ENCOUNTER — Other Ambulatory Visit: Payer: Self-pay | Admitting: Family

## 2020-06-24 ENCOUNTER — Ambulatory Visit (INDEPENDENT_AMBULATORY_CARE_PROVIDER_SITE_OTHER): Payer: 59 | Admitting: Family

## 2020-06-24 ENCOUNTER — Other Ambulatory Visit: Payer: Self-pay

## 2020-06-24 ENCOUNTER — Encounter: Payer: Self-pay | Admitting: Family

## 2020-06-24 VITALS — BP 139/74 | HR 78 | Temp 97.9°F | Ht 70.0 in | Wt 328.0 lb

## 2020-06-24 DIAGNOSIS — B3789 Other sites of candidiasis: Secondary | ICD-10-CM

## 2020-06-24 DIAGNOSIS — M5441 Lumbago with sciatica, right side: Secondary | ICD-10-CM | POA: Diagnosis not present

## 2020-06-24 DIAGNOSIS — E1169 Type 2 diabetes mellitus with other specified complication: Secondary | ICD-10-CM | POA: Diagnosis not present

## 2020-06-24 MED ORDER — NYSTATIN 100000 UNIT/GM EX CREA
1.0000 | TOPICAL_CREAM | Freq: Two times a day (BID) | CUTANEOUS | 1 refills | Status: DC
Start: 2020-06-24 — End: 2020-08-01

## 2020-06-24 MED ORDER — BACLOFEN 10 MG PO TABS: 10.0000 mg | ORAL_TABLET | Freq: Three times a day (TID) | ORAL | 0 refills | Status: DC

## 2020-06-24 MED ORDER — NYSTATIN 100000 UNIT/GM EX POWD: 1.0000 "application " | Freq: Three times a day (TID) | CUTANEOUS | 0 refills | Status: DC

## 2020-06-24 MED ORDER — DICLOFENAC SODIUM 75 MG PO TBEC: 75.0000 mg | DELAYED_RELEASE_TABLET | Freq: Two times a day (BID) | ORAL | 1 refills | Status: DC

## 2020-06-24 MED ORDER — GABAPENTIN 100 MG PO CAPS: 100.0000 mg | ORAL_CAPSULE | Freq: Three times a day (TID) | ORAL | 3 refills | Status: DC

## 2020-06-24 NOTE — Patient Instructions (Signed)
Sciatica Rehab Ask your health care provider which exercises are safe for you. Do exercises exactly as told by your health care provider and adjust them as directed. It is normal to feel mild stretching, pulling, tightness, or discomfort as you do these exercises. Stop right away if you feel sudden pain or your pain gets worse. Do not begin these exercises until told by your health care provider. Stretching and range-of-motion exercises These exercises warm up your muscles and joints and improve the movement and flexibility of your hips and back. These exercises also help to relieve pain, numbness, and tingling. Sciatic nerve glide 1. Sit in a chair with your head facing down toward your chest. Place your hands behind your back. Let your shoulders slump forward. 2. Slowly straighten one of your legs while you tilt your head back as if you are looking toward the ceiling. Only straighten your leg as far as you can without making your symptoms worse. 3. Hold this position for __________ seconds. 4. Slowly return to the starting position. 5. Repeat with your other leg. Repeat __________ times. Complete this exercise __________ times a day. Knee to chest with hip adduction and internal rotation 1. Lie on your back on a firm surface with both legs straight. 2. Bend one of your knees and move it up toward your chest until you feel a gentle stretch in your lower back and buttock. Then, move your knee toward the shoulder that is on the opposite side from your leg. This is hip adduction and internal rotation. ? Hold your leg in this position by holding on to the front of your knee. 3. Hold this position for __________ seconds. 4. Slowly return to the starting position. 5. Repeat with your other leg. Repeat __________ times. Complete this exercise __________ times a day.   Prone extension on elbows 1. Lie on your abdomen on a firm surface. A bed may be too soft for this exercise. 2. Prop yourself up on  your elbows. 3. Use your arms to help lift your chest up until you feel a gentle stretch in your abdomen and your lower back. ? This will place some of your body weight on your elbows. If this is uncomfortable, try stacking pillows under your chest. ? Your hips should stay down, against the surface that you are lying on. Keep your hip and back muscles relaxed. 4. Hold this position for __________ seconds. 5. Slowly relax your upper body and return to the starting position. Repeat __________ times. Complete this exercise __________ times a day.   Strengthening exercises These exercises build strength and endurance in your back. Endurance is the ability to use your muscles for a long time, even after they get tired. Pelvic tilt This exercise strengthens the muscles that lie deep in the abdomen. 1. Lie on your back on a firm surface. Bend your knees and keep your feet flat on the floor. 2. Tense your abdominal muscles. Tip your pelvis up toward the ceiling and flatten your lower back into the floor. ? To help with this exercise, you may place a small towel under your lower back and try to push your back into the towel. 3. Hold this position for __________ seconds. 4. Let your muscles relax completely before you repeat this exercise. Repeat __________ times. Complete this exercise __________ times a day. Alternating arm and leg raises 1. Get on your hands and knees on a firm surface. If you are on a hard floor, you may want to   use padding, such as an exercise mat, to cushion your knees. 2. Line up your arms and legs. Your hands should be directly below your shoulders, and your knees should be directly below your hips. 3. Lift your left leg behind you. At the same time, raise your right arm and straighten it in front of you. ? Do not lift your leg higher than your hip. ? Do not lift your arm higher than your shoulder. ? Keep your abdominal and back muscles tight. ? Keep your hips facing the  ground. ? Do not arch your back. ? Keep your balance carefully, and do not hold your breath. 4. Hold this position for __________ seconds. 5. Slowly return to the starting position. 6. Repeat with your right leg and your left arm. Repeat __________ times. Complete this exercise __________ times a day.   Posture and body mechanics Good posture and healthy body mechanics can help to relieve stress in your body's tissues and joints. Body mechanics refers to the movements and positions of your body while you do your daily activities. Posture is part of body mechanics. Good posture means:  Your spine is in its natural S-curve position (neutral).  Your shoulders are pulled back slightly.  Your head is not tipped forward. Follow these guidelines to improve your posture and body mechanics in your everyday activities. Standing  When standing, keep your spine neutral and your feet about hip width apart. Keep a slight bend in your knees. Your ears, shoulders, and hips should line up.  When you do a task in which you stand in one place for a long time, place one foot up on a stable object that is 2-4 inches (5-10 cm) high, such as a footstool. This helps keep your spine neutral.   Sitting  When sitting, keep your spine neutral and keep your feet flat on the floor. Use a footrest, if necessary, and keep your thighs parallel to the floor. Avoid rounding your shoulders, and avoid tilting your head forward.  When working at a desk or a computer, keep your desk at a height where your hands are slightly lower than your elbows. Slide your chair under your desk so you are close enough to maintain good posture.  When working at a computer, place your monitor at a height where you are looking straight ahead and you do not have to tilt your head forward or downward to look at the screen.   Resting  When lying down and resting, avoid positions that are most painful for you.  If you have pain with activities  such as sitting, bending, stooping, or squatting, lie in a position in which your body does not bend very much. For example, avoid curling up on your side with your arms and knees near your chest (fetal position).  If you have pain with activities such as standing for a long time or reaching with your arms, lie with your spine in a neutral position and bend your knees slightly. Try the following positions: ? Lying on your side with a pillow between your knees. ? Lying on your back with a pillow under your knees. Lifting  When lifting objects, keep your feet at least shoulder width apart and tighten your abdominal muscles.  Bend your knees and hips and keep your spine neutral. It is important to lift using the strength of your legs, not your back. Do not lock your knees straight out.  Always ask for help to lift heavy or awkward objects.     This information is not intended to replace advice given to you by your health care provider. Make sure you discuss any questions you have with your health care provider. Document Revised: 07/14/2018 Document Reviewed: 04/13/2018 Elsevier Patient Education  2021 [employer]   Sciatica  Sciatica is pain, numbness, weakness, or tingling along the path of the sciatic nerve. The sciatic nerve starts in the lower back and runs down the back of each leg. The nerve controls the muscles in the lower leg and in the back of the knee. It also provides feeling (sensation) to the back of the thigh, the lower leg, and the sole of the foot. Sciatica is a symptom of another medical condition that pinches or puts pressure on the sciatic nerve. Sciatica most often only affects one side of the body. Sciatica usually goes away on its own or with treatment. In some cases, sciatica may come back (recur). What are the causes? This condition is caused by pressure on the sciatic nerve or pinching of the nerve. This may be the result of:  A disk in between the bones of the spine  bulging out too far (herniated disk).  Age-related changes in the spinal disks.  A pain disorder that affects a muscle in the buttock.  Extra bone growth near the sciatic nerve.  A break (fracture) of the pelvis.  Pregnancy.  Tumor. This is rare. What increases the risk? The following factors may make you more likely to develop this condition:  Playing sports that place pressure or stress on the spine.  Having poor strength and flexibility.  A history of back injury or surgery.  Sitting for long periods of time.  Doing activities that involve repetitive bending or lifting.  Obesity. What are the signs or symptoms? Symptoms can vary from mild to very severe, and they may include:  Any of these problems in the lower back, leg, hip, or buttock: ? Mild tingling, numbness, or dull aches. ? Burning sensations. ? Sharp pains.  Numbness in the back of the calf or the sole of the foot.  Leg weakness.  Severe back pain that makes movement difficult. Symptoms may get worse when you cough, sneeze, or laugh, or when you sit or stand for long periods of time. How is this diagnosed? This condition may be diagnosed based on:  Your symptoms and medical history.  A physical exam.  Blood tests.  Imaging tests, such as: ? X-rays. ? MRI. ? CT scan. How is this treated? In many cases, this condition improves on its own without treatment. However, treatment may include:  Reducing or modifying physical activity.  Exercising and stretching.  Icing and applying heat to the affected area.  Medicines that help to: ? Relieve pain and swelling. ? Relax your muscles.  Injections of medicines that help to relieve pain, irritation, and inflammation around the sciatic nerve (steroids).  Surgery. Follow these instructions at home: Medicines  Take over-the-counter and prescription medicines only as told by your health care provider.  Ask your health care provider if the  medicine prescribed to you: ? Requires you to avoid driving or using heavy machinery. ? Can cause constipation. You may need to take these actions to prevent or treat constipation:  Drink enough fluid to keep your urine pale yellow.  Take over-the-counter or prescription medicines.  Eat foods that are high in fiber, such as beans, whole grains, and fresh fruits and vegetables.  Limit foods that are high in fat and processed sugars, such   as fried or sweet foods. Managing pain  If directed, put ice on the affected area. ? Put ice in a plastic bag. ? Place a towel between your skin and the bag. ? Leave the ice on for 20 minutes, 2-3 times a day.  If directed, apply heat to the affected area. Use the heat source that your health care provider recommends, such as a moist heat pack or a heating pad. ? Place a towel between your skin and the heat source. ? Leave the heat on for 20-30 minutes. ? Remove the heat if your skin turns bright red. This is especially important if you are unable to feel pain, heat, or cold. You may have a greater risk of getting burned.      Activity  Return to your normal activities as told by your health care provider. Ask your health care provider what activities are safe for you.  Avoid activities that make your symptoms worse.  Take brief periods of rest throughout the day. ? When you rest for longer periods, mix in some mild activity or stretching between periods of rest. This will help to prevent stiffness and pain. ? Avoid sitting for long periods of time without moving. Get up and move around at least one time each hour.  Exercise and stretch regularly, as told by your health care provider.  Do not lift anything that is heavier than 10 lb (4.5 kg) while you have symptoms of sciatica. When you do not have symptoms, you should still avoid heavy lifting, especially repetitive heavy lifting.  When you lift objects, always use proper lifting technique,  which includes: ? Bending your knees. ? Keeping the load close to your body. ? Avoiding twisting.   General instructions  Maintain a healthy weight. Excess weight puts extra stress on your back.  Wear supportive, comfortable shoes. Avoid wearing high heels.  Avoid sleeping on a mattress that is too soft or too hard. A mattress that is firm enough to support your back when you sleep may help to reduce your pain.  Keep all follow-up visits as told by your health care provider. This is important. Contact a health care provider if:  You have pain that: ? Wakes you up when you are sleeping. ? Gets worse when you lie down. ? Is worse than you have experienced in the past. ? Lasts longer than 4 weeks.  You have an unexplained weight loss. Get help right away if:  You are not able to control when you urinate or have bowel movements (incontinence).  You have: ? Weakness in your lower back, pelvis, buttocks, or legs that gets worse. ? Redness or swelling of your back. ? A burning sensation when you urinate. Summary  Sciatica is pain, numbness, weakness, or tingling along the path of the sciatic nerve.  This condition is caused by pressure on the sciatic nerve or pinching of the nerve.  Sciatica can cause pain, numbness, or tingling in the lower back, legs, hips, and buttocks.  Treatment often includes rest, exercise, medicines, and applying ice or heat. This information is not intended to replace advice given to you by your health care provider. Make sure you discuss any questions you have with your health care provider. Document Revised: 04/10/2018 Document Reviewed: 04/10/2018 Elsevier Patient Education  2021 [employer]  

## 2020-06-24 NOTE — Progress Notes (Signed)
Subjective:    Patient ID: Ernest Miller, male    DOB: [birth year], 45 y.o.   MRN: [id number]  Chief Complaint  Patient presents with  . Back Pain    Lower back pain some pain will run down leg depending how he moves. He has been lifting a lot of heavy stuff.  . Diabetes    Trulicity is causing bad gas states it taste like rotting eggs   . Rash    Thinks it from trulicity     Back Pain This is a chronic problem. The current episode started more than 1 year ago. The problem occurs intermittently. The problem has been waxing and waning since onset. The pain is present in the lumbar spine. The quality of the pain is described as aching. The pain radiates to the right thigh. The pain is at a severity of 7/10. The pain is moderate. The symptoms are aggravated by lying down and twisting. Associated symptoms include leg pain, numbness, tingling and weakness. He has tried NSAIDs and bed rest for the symptoms. The treatment provided mild relief.  Diabetes He presents for his follow-up diabetic visit. He has type 2 diabetes mellitus. There are no hypoglycemic associated symptoms. Associated symptoms include weakness. Pertinent negatives for diabetes include no blurred vision and no foot paresthesias. Symptoms are stable. Pertinent negatives for diabetic complications include no CVA, heart disease or peripheral neuropathy. Risk factors for coronary artery disease include dyslipidemia, diabetes mellitus, male sex, hypertension and sedentary lifestyle. He is following a generally unhealthy diet. His overall blood glucose range is 110-130 mg/dl. An ACE inhibitor/angiotensin II receptor blocker is being taken.  Rash This is a new problem. The current episode started more than 1 month ago. The problem has been waxing and waning since onset. The affected locations include the groin (buttocks). The rash is characterized by itchiness and burning. Associated with: since starting Trulicity. Associated symptoms  include nail changes. Treatments tried: nystatin cream. The treatment provided mild relief.      Review of Systems  Eyes: Negative for blurred vision.  Musculoskeletal: Positive for back pain.  Skin: Positive for nail changes and rash.  Neurological: Positive for tingling, weakness and numbness.       Objective:   Physical Exam Vitals reviewed.  Constitutional:      General: He is not in acute distress.    Appearance: He is well-developed. He is obese.  HENT:     Head: Normocephalic.     Right Ear: Tympanic membrane normal.     Left Ear: Tympanic membrane normal.  Eyes:     General:        Right eye: No discharge.        Left eye: No discharge.     Pupils: Pupils are equal, round, and reactive to light.  Neck:     Thyroid: No thyromegaly.  Cardiovascular:     Rate and Rhythm: Normal rate and regular rhythm.     Heart sounds: Normal heart sounds. No murmur heard.   Pulmonary:     Effort: Pulmonary effort is normal. No respiratory distress.     Breath sounds: Normal breath sounds. No wheezing.  Abdominal:     General: Bowel sounds are normal. There is no distension.     Palpations: Abdomen is soft.     Tenderness: There is no abdominal tenderness.  Musculoskeletal:        General: No tenderness. Normal range of motion.     Cervical back: Normal range  of motion and neck supple.     Comments: Pain in lumbar with flexion or extension  Skin:    General: Skin is warm and dry.     Findings: Rash (in bilateral grion and buttocks ) present. No erythema.  Neurological:     Mental Status: He is alert and oriented to person, place, and time.     Cranial Nerves: No cranial nerve deficit.     Deep Tendon Reflexes: Reflexes are normal and symmetric.  Psychiatric:        Behavior: Behavior normal.        Thought Content: Thought content normal.        Judgment: Judgment normal.       BP 139/74   Pulse 78   Temp 97.9 F (36.6 C) (Temporal)   Ht 5\' 10"  (1.778 m)   Wt  (!) 328 lb (148.8 kg)   BMI 47.06 kg/m      Assessment & Plan:  Ernest Miller comes in today with chief complaint of Back Pain (Lower back pain some pain will run down leg depending how he moves. He has been lifting a lot of heavy stuff.), Diabetes (Trulicity is causing bad gas states it taste like rotting eggs ), and Rash (Thinks it from trulicity )   Diagnosis and orders addressed:  1. Acute right-sided low back pain with right-sided sciatica Rest ROM exercises encouraged No other NSAIDs while taking diclofenac Sedation precautions   - gabapentin (NEURONTIN) 100 MG capsule; Take 1 capsule (100 mg total) by mouth 3 (three) times daily.  Dispense: 90 capsule; Refill: 3 - baclofen (LIORESAL) 10 MG tablet; Take 1 tablet (10 mg total) by mouth 3 (three) times daily.  Dispense: 30 each; Refill: 0 - diclofenac (VOLTAREN) 75 MG EC tablet; Take 1 tablet (75 mg total) by mouth 2 (two) times daily.  Dispense: 60 tablet; Refill: 1  2. Type 2 diabetes mellitus with other specified complication, without long-term current use of insulin (HCC) Continue Trulicity  Do not over eat  3. Candida rash of groin Keep clean and dry  Do not scratch  - nystatin (MYCOSTATIN/NYSTOP) powder; Apply 1 application topically 3 (three) times daily.  Dispense: 15 g; Refill: 0    Health Maintenance reviewed Diet and exercise encouraged  Follow up plan: 2 months   [name], FNP

## 2020-08-01 ENCOUNTER — Other Ambulatory Visit: Payer: Self-pay

## 2020-08-01 ENCOUNTER — Encounter: Payer: Self-pay | Admitting: Family

## 2020-08-01 ENCOUNTER — Ambulatory Visit (INDEPENDENT_AMBULATORY_CARE_PROVIDER_SITE_OTHER): Payer: 59 | Admitting: Family

## 2020-08-01 VITALS — BP 140/82 | HR 77 | Temp 97.7°F | Wt 324.8 lb

## 2020-08-01 DIAGNOSIS — E1169 Type 2 diabetes mellitus with other specified complication: Secondary | ICD-10-CM

## 2020-08-01 DIAGNOSIS — E785 Hyperlipidemia, unspecified: Secondary | ICD-10-CM

## 2020-08-01 DIAGNOSIS — I10 Essential (primary) hypertension: Secondary | ICD-10-CM

## 2020-08-01 DIAGNOSIS — F411 Generalized anxiety disorder: Secondary | ICD-10-CM

## 2020-08-01 DIAGNOSIS — Z5320 Procedure and treatment not carried out because of patient's decision for unspecified reasons: Secondary | ICD-10-CM

## 2020-08-01 DIAGNOSIS — S20469A Insect bite (nonvenomous) of unspecified back wall of thorax, initial encounter: Secondary | ICD-10-CM

## 2020-08-01 DIAGNOSIS — Z1211 Encounter for screening for malignant neoplasm of colon: Secondary | ICD-10-CM

## 2020-08-01 DIAGNOSIS — W57XXXA Bitten or stung by nonvenomous insect and other nonvenomous arthropods, initial encounter: Secondary | ICD-10-CM

## 2020-08-01 DIAGNOSIS — Z1159 Encounter for screening for other viral diseases: Secondary | ICD-10-CM

## 2020-08-01 DIAGNOSIS — B3789 Other sites of candidiasis: Secondary | ICD-10-CM

## 2020-08-01 LAB — BAYER DCA HB A1C WAIVED: HB A1C (BAYER DCA - WAIVED): 6.5 % (ref <7.0)

## 2020-08-01 MED ORDER — NYSTATIN 100000 UNIT/GM EX POWD: 1.0000 "application " | Freq: Three times a day (TID) | CUTANEOUS | 2 refills | Status: DC

## 2020-08-01 MED ORDER — DOXYCYCLINE HYCLATE 100 MG PO TABS: 100.0000 mg | ORAL_TABLET | Freq: Two times a day (BID) | ORAL | 0 refills | Status: DC

## 2020-08-01 MED ORDER — OZEMPIC (0.25 OR 0.5 MG/DOSE) 2 MG/1.5ML ~~LOC~~ SOPN: PEN_INJECTOR | SUBCUTANEOUS | 2 refills | Status: DC

## 2020-08-01 MED ORDER — LISINOPRIL 20 MG PO TABS: 20.0000 mg | ORAL_TABLET | Freq: Every day | ORAL | 3 refills | Status: DC

## 2020-08-01 MED ORDER — NYSTATIN 100000 UNIT/GM EX CREA: 1.0000 "application " | TOPICAL_CREAM | Freq: Two times a day (BID) | CUTANEOUS | 1 refills | Status: DC

## 2020-08-01 NOTE — Progress Notes (Signed)
Subjective:    Patient ID: Ernest Miller, male    DOB: November 24, 1975, 45 y.o.   MRN: [id number]  Chief Complaint  Patient presents with  . Insect Bite    Tick pulled off Sunday body aches, fatigue   . Diabetes    Needs A1c, Trulicity gives him horrible burps. Needs a change    PT presents to the office today for chronic follow up. He reports he had a tick on his left thoracic area Sunday. He states since he has had fatigue and body aches. Denies any fever or rash.  Diabetes He presents for his follow-up diabetic visit. He has type 2 diabetes mellitus. His disease course has been stable. Hypoglycemia symptoms include nervousness/anxiousness. Pertinent negatives for diabetes include no blurred vision and no foot paresthesias. Symptoms are stable. Diabetic complications include heart disease. Risk factors for coronary artery disease include diabetes mellitus, dyslipidemia, male sex, hypertension and sedentary lifestyle. He is following a generally unhealthy diet. His overall blood glucose range is 130-140 mg/dl. Eye exam is not current.  Hypertension This is a chronic problem. The current episode started more than 1 year ago. The problem has been waxing and waning since onset. The problem is uncontrolled. Associated symptoms include anxiety and malaise/fatigue. Pertinent negatives include no blurred vision, peripheral edema or shortness of breath. Risk factors for coronary artery disease include dyslipidemia, diabetes mellitus, obesity, male gender and sedentary lifestyle. The current treatment provides moderate improvement.  Hyperlipidemia This is a chronic problem. The current episode started more than 1 year ago. The problem is controlled. Exacerbating diseases include obesity. Pertinent negatives include no shortness of breath. Current antihyperlipidemic treatment includes statins. The current treatment provides moderate improvement of lipids. Risk factors for coronary artery disease include  dyslipidemia, diabetes mellitus, male sex, hypertension and a sedentary lifestyle.  Anxiety Presents for follow-up visit. Symptoms include depressed mood, excessive worry, irritability and nervous/anxious behavior. Patient reports no shortness of breath. Symptoms occur most days. The severity of symptoms is moderate.        Review of Systems  Constitutional: Positive for irritability and malaise/fatigue.  Eyes: Negative for blurred vision.  Respiratory: Negative for shortness of breath.   Psychiatric/Behavioral: The patient is nervous/anxious.   All other systems reviewed and are negative.      Objective:   Physical Exam Vitals reviewed.  Constitutional:      General: He is not in acute distress.    Appearance: He is well-developed. He is obese.  HENT:     Head: Normocephalic.     Right Ear: Tympanic membrane normal.     Left Ear: Tympanic membrane normal.  Eyes:     General:        Right eye: No discharge.        Left eye: No discharge.     Pupils: Pupils are equal, round, and reactive to light.  Neck:     Thyroid: No thyromegaly.  Cardiovascular:     Rate and Rhythm: Normal rate and regular rhythm.     Heart sounds: Normal heart sounds. No murmur heard.   Pulmonary:     Effort: Pulmonary effort is normal. No respiratory distress.     Breath sounds: Normal breath sounds. No wheezing.  Abdominal:     General: Bowel sounds are normal. There is no distension.     Palpations: Abdomen is soft.     Tenderness: There is no abdominal tenderness.  Musculoskeletal:        General: No tenderness.  Normal range of motion.     Cervical back: Normal range of motion and neck supple.  Skin:    General: Skin is warm and dry.     Findings: Rash present. No erythema.          Comments: Erythemas area on left thoracic area   Neurological:     Mental Status: He is alert and oriented to person, place, and time.     Cranial Nerves: No cranial nerve deficit.     Deep Tendon  Reflexes: Reflexes are normal and symmetric.  Psychiatric:        Behavior: Behavior normal.        Thought Content: Thought content normal.        Judgment: Judgment normal.     Diabetic Foot Exam - Simple   Simple Foot Form Diabetic Foot exam was performed with the following findings: Yes 08/01/2020  8:41 AM  Visual Inspection No deformities, no ulcerations, no other skin breakdown bilaterally: Yes Sensation Testing Intact to touch and monofilament testing bilaterally: Yes Pulse Check Posterior Tibialis and Dorsalis pulse intact bilaterally: Yes Comments Toenails thick and discoloration present      BP 140/82   Pulse 77   Temp 97.7 F (36.5 C) (Temporal)   Wt (!) 324 lb 12.8 oz (147.3 kg)   BMI 46.60 kg/m      Assessment & Plan:  Ernest Miller comes in today with chief complaint of Insect Bite (Tick pulled off Sunday body aches, fatigue ) and Diabetes (Needs S3P, Trulicity gives him horrible burps. Needs a change )   Diagnosis and orders addressed:  1. Primary hypertension - CMP14+EGFR  2. Type 2 diabetes mellitus with other specified complication, without long-term current use of insulin (HCC) Will change Trulicity to Ozempic  Low carb diet  - Bayer DCA Hb A1c Waived - CMP14+EGFR - Semaglutide,0.25 or 0.5MG /DOS, (OZEMPIC, 0.25 OR 0.5 MG/DOSE,) 2 MG/1.5ML SOPN; Inject 0.25 mg into the skin once a week for 28 days, THEN 0.5 mg once a week for 28 days.  Dispense: 1.5 mL; Refill: 2  3. Hyperlipidemia associated with type 2 diabetes mellitus (HCC) - CMP14+EGFR  4. GAD (generalized anxiety disorder) - CMP14+EGFR  5. Morbid obesity ([place]) - CMP14+EGFR  6. Candida rash of groin - nystatin (MYCOSTATIN/NYSTOP) powder; Apply 1 application topically 3 (three) times daily.  Dispense: 120 g; Refill: 2 - CMP14+EGFR  7. Need for hepatitis C screening test - Hepatitis C antibody - CMP14+EGFR  8. HIV screening declined - HIV Antibody (routine testing w rflx) -  CMP14+EGFR  9. Colon cancer screening - Cologuard - CMP14+EGFR  10. Tick bite of back wall of thorax, unspecified location, initial encounter -Pt to report any new fever, joint pain, or rash -Wear protective clothing while outside- Long sleeves and long pants -Put insect repellent on all exposed skin and along clothing -Take a shower as soon as possible after being outside - doxycycline (VIBRA-TABS) 100 MG tablet; Take 1 tablet (100 mg total) by mouth 2 (two) times daily.  Dispense: 42 tablet; Refill: 0  11. Essential hypertension  - lisinopril (ZESTRIL) 20 MG tablet; Take 1 tablet (20 mg total) by mouth daily.  Dispense: 90 tablet; Refill: 3   Labs pending Health Maintenance reviewed Diet and exercise encouraged  Follow up plan: 2 months    [name], FNP

## 2020-08-01 NOTE — Patient Instructions (Signed)
Diabetes Mellitus and Nutrition, Adult When you have diabetes, or diabetes mellitus, it is very important to have healthy eating habits because your blood sugar (glucose) levels are greatly affected by what you eat and drink. Eating healthy foods in the right amounts, at about the same times every day, can help you:  Control your blood glucose.  Lower your risk of heart disease.  Improve your blood pressure.  Reach or maintain a healthy weight. What can affect my meal plan? Every person with diabetes is different, and each person has different needs for a meal plan. Your health care provider may recommend that you work with a dietitian to make a meal plan that is best for you. Your meal plan may vary depending on factors such as:  The calories you need.  The medicines you take.  Your weight.  Your blood glucose, blood pressure, and cholesterol levels.  Your activity level.  Other health conditions you have, such as heart or kidney disease. How do carbohydrates affect me? Carbohydrates, also called carbs, affect your blood glucose level more than any other type of food. Eating carbs naturally raises the amount of glucose in your blood. Carb counting is a method for keeping track of how many carbs you eat. Counting carbs is important to keep your blood glucose at a healthy level, especially if you use insulin or take certain oral diabetes medicines. It is important to know how many carbs you can safely have in each meal. This is different for every person. Your dietitian can help you calculate how many carbs you should have at each meal and for each snack. How does alcohol affect me? Alcohol can cause a sudden decrease in blood glucose (hypoglycemia), especially if you use insulin or take certain oral diabetes medicines. Hypoglycemia can be a life-threatening condition. Symptoms of hypoglycemia, such as sleepiness, dizziness, and confusion, are similar to symptoms of having too much  alcohol.  Do not drink alcohol if: ? Your health care provider tells you not to drink. ? You are pregnant, may be pregnant, or are planning to become pregnant.  If you drink alcohol: ? Do not drink on an empty stomach. ? Limit how much you use to:  0-1 drink a day for women.  0-2 drinks a day for men. ? Be aware of how much alcohol is in your drink. In the [place] one drink equals one 12 oz bottle of beer (355 mL), one 5 oz glass of wine (148 mL), or one 1 oz glass of hard liquor (44 mL). ? Keep yourself hydrated with water, diet soda, or unsweetened iced tea.  Keep in mind that regular soda, juice, and other mixers may contain a lot of sugar and must be counted as carbs. What are tips for following this plan? Reading food labels  Start by checking the serving size on the "Nutrition Facts" label of packaged foods and drinks. The amount of calories, carbs, fats, and other nutrients listed on the label is based on one serving of the item. Many items contain more than one serving per package.  Check the total grams (g) of carbs in one serving. You can calculate the number of servings of carbs in one serving by dividing the total carbs by 15. For example, if a food has 30 g of total carbs per serving, it would be equal to 2 servings of carbs.  Check the number of grams (g) of saturated fats and trans fats in one serving. Choose foods that have   a low amount or none of these fats.  Check the number of milligrams (mg) of salt (sodium) in one serving. Most people should limit total sodium intake to less than 2,300 mg per day.  Always check the nutrition information of foods labeled as "low-fat" or "nonfat." These foods may be higher in added sugar or refined carbs and should be avoided.  Talk to your dietitian to identify your daily goals for nutrients listed on the label. Shopping  Avoid buying canned, pre-made, or processed foods. These foods tend to be high in fat, sodium, and added  sugar.  Shop around the outside edge of the grocery store. This is where you will most often find fresh fruits and vegetables, bulk grains, fresh meats, and fresh dairy. Cooking  Use low-heat cooking methods, such as baking, instead of high-heat cooking methods like deep frying.  Cook using healthy oils, such as olive, canola, or sunflower oil.  Avoid cooking with butter, cream, or high-fat meats. Meal planning  Eat meals and snacks regularly, preferably at the same times every day. Avoid going long periods of time without eating.  Eat foods that are high in fiber, such as fresh fruits, vegetables, beans, and whole grains. Talk with your dietitian about how many servings of carbs you can eat at each meal.  Eat 4-6 oz (112-168 g) of lean protein each day, such as lean meat, chicken, fish, eggs, or tofu. One ounce (oz) of lean protein is equal to: ? 1 oz (28 g) of meat, chicken, or fish. ? 1 egg. ?  cup (62 g) of tofu.  Eat some foods each day that contain healthy fats, such as avocado, nuts, seeds, and fish.   What foods should I eat? Fruits Berries. Apples. Oranges. Peaches. Apricots. Plums. Grapes. Mango. Papaya. Pomegranate. Kiwi. Cherries. Vegetables Lettuce. Spinach. Leafy greens, including kale, chard, collard greens, and mustard greens. Beets. Cauliflower. Cabbage. Broccoli. Carrots. Green beans. Tomatoes. Peppers. Onions. Cucumbers. Brussels sprouts. Grains Whole grains, such as whole-wheat or whole-grain bread, crackers, tortillas, cereal, and pasta. Unsweetened oatmeal. Quinoa. Brown or wild rice. Meats and other proteins Seafood. Poultry without skin. Lean cuts of poultry and beef. Tofu. Nuts. Seeds. Dairy Low-fat or fat-free dairy products such as milk, yogurt, and cheese. The items listed above may not be a complete list of foods and beverages you can eat. Contact a dietitian for more information. What foods should I avoid? Fruits Fruits canned with  syrup. Vegetables Canned vegetables. Frozen vegetables with butter or cream sauce. Grains Refined white flour and flour products such as bread, pasta, snack foods, and cereals. Avoid all processed foods. Meats and other proteins Fatty cuts of meat. Poultry with skin. Breaded or fried meats. Processed meat. Avoid saturated fats. Dairy Full-fat yogurt, cheese, or milk. Beverages Sweetened drinks, such as soda or iced tea. The items listed above may not be a complete list of foods and beverages you should avoid. Contact a dietitian for more information. Questions to ask a health care provider  Do I need to meet with a diabetes educator?  Do I need to meet with a dietitian?  What number can I call if I have questions?  When are the best times to check my blood glucose? Where to find more information:  American Diabetes Association: diabetes.org  Academy of Nutrition and Dietetics: www.eatright.org  National Institute of Diabetes and Digestive and Kidney Diseases: www.niddk.nih.gov  Association of Diabetes Care and Education Specialists: www.diabeteseducator.org Summary  It is important to have healthy eating   habits because your blood sugar (glucose) levels are greatly affected by what you eat and drink.  A healthy meal plan will help you control your blood glucose and maintain a healthy lifestyle.  Your health care provider may recommend that you work with a dietitian to make a meal plan that is best for you.  Keep in mind that carbohydrates (carbs) and alcohol have immediate effects on your blood glucose levels. It is important to count carbs and to use alcohol carefully. This information is not intended to replace advice given to you by your health care provider. Make sure you discuss any questions you have with your health care provider. Document Revised: 02/27/2019 Document Reviewed: 02/27/2019 Elsevier Patient Education  2021 Elsevier Inc.  

## 2020-08-02 LAB — CMP14+EGFR
ALT: 36 IU/L (ref 0–44)
AST: 27 IU/L (ref 0–40)
Albumin/Globulin Ratio: 1.6 (ref 1.2–2.2)
Albumin: 4.6 g/dL (ref 4.0–5.0)
Alkaline Phosphatase: 89 IU/L (ref 44–121)
BUN/Creatinine Ratio: 19 (ref 9–20)
BUN: 20 mg/dL (ref 6–24)
Bilirubin Total: 0.3 mg/dL (ref 0.0–1.2)
CO2: 25 mmol/L (ref 20–29)
Calcium: 9.5 mg/dL (ref 8.7–10.2)
Chloride: 101 mmol/L (ref 96–106)
Creatinine, Ser: 1.06 mg/dL (ref 0.76–1.27)
Globulin, Total: 2.9 g/dL (ref 1.5–4.5)
Glucose: 143 mg/dL — ABNORMAL HIGH (ref 65–99)
Potassium: 5 mmol/L (ref 3.5–5.2)
Sodium: 141 mmol/L (ref 134–144)
Total Protein: 7.5 g/dL (ref 6.0–8.5)
eGFR: 88 mL/min/{1.73_m2} (ref >59)

## 2020-08-02 LAB — HEPATITIS C ANTIBODY: Hep C Virus Ab: <0.1 s/co ratio (ref 0.0–0.9)

## 2020-08-02 LAB — HIV ANTIBODY (ROUTINE TESTING W REFLEX): HIV Screen 4th Generation wRfx: NONREACTIVE

## 2020-08-19 ENCOUNTER — Ambulatory Visit
Admission: EM | Admit: 2020-08-19 | Discharge: 2020-08-19 | Disposition: A | Discharge status: Home / Self Care | Payer: 59 | Attending: Family Medicine | Admitting: Family Medicine

## 2020-08-19 ENCOUNTER — Other Ambulatory Visit: Payer: Self-pay

## 2020-08-19 DIAGNOSIS — R1013 Epigastric pain: Secondary | ICD-10-CM | POA: Diagnosis not present

## 2020-08-19 MED ORDER — LIDOCAINE VISCOUS HCL 2 % MT SOLN
15.0000 mL | Freq: Once | OROMUCOSAL | Status: AC
  Administered 2020-08-19: 15 mL via ORAL

## 2020-08-19 MED ORDER — ALUM & MAG HYDROXIDE-SIMETH 200-200-20 MG/5ML PO SUSP
30.0000 mL | Freq: Once | ORAL | Status: AC
  Administered 2020-08-19: 30 mL via ORAL

## 2020-08-19 MED ORDER — SUCRALFATE 1 G PO TABS: 1.0000 g | ORAL_TABLET | Freq: Three times a day (TID) | ORAL | 0 refills | Status: DC

## 2020-08-19 NOTE — ED Triage Notes (Signed)
Pt states that he has some abdominal pain. Pt states that the pain began after the change of his medication. Pt states that it hurts when he breathes and it feels like a blockage or indigestion. x3 days

## 2020-08-19 NOTE — ED Provider Notes (Addendum)
[facility]    CSN: [id number] Arrival date & time: 08/19/20  1226      History   Chief Complaint Chief Complaint  Patient presents with  . Abdominal Pain    Pt states that he has some abdominal pain. Pt states the pain started after the change in his medication. X3 days    HPI Ernest Miller is a 45 y.o. male.   Reports epigastric pain for the last 3 days since increasing dose of ozempic. Reports feeling full and unable to eat. Reports that the pain is burning in character. Has taken omeprazole for this with no relief. Reports previous symptoms when starting ozempic, but pain was not this severe. Denies headache, chills, body aches, diarrhea, fever, rash, other symptoms.   ROS per HPI  The history is provided by the patient.  Abdominal Pain   Past Medical History:  Diagnosis Date  . Anxiety   . Complication of anesthesia    had hard time waking patient up in 2000  . Dyspnea    increased exertion   . History of kidney stones   . Kidney stone 12/26/2017    Patient Active Problem List   Diagnosis Date Noted  . Elevated blood pressure reading 08/31/2019  . Chest pain 08/20/2019  . Hypertension 06/05/2019  . Kidney stone 12/26/2017  . S/P laparoscopic cholecystectomy 11/24/2017  . Diabetes mellitus ([place]) 02/16/2017  . Hyperlipidemia associated with type 2 diabetes mellitus ([place]) 02/16/2017  . Morbid obesity ([place]) 02/16/2017  . GAD (generalized anxiety disorder) 05/25/2016  . SOB (shortness of breath) 05/25/2016    Past Surgical History:  Procedure Laterality Date  . CHOLECYSTECTOMY  11/24/2017   LAPROSCOPIC  . CHOLECYSTECTOMY N/A 11/24/2017   Procedure: LAPAROSCOPIC CHOLECYSTECTOMY;  Surgeon: Coralie Keens, MD;  Location: [facility];  Service: General;  Laterality: N/A;  . ELBOW SURGERY Right   . EXTRACORPOREAL SHOCK WAVE LITHOTRIPSY Left 10/27/2017   Procedure: LEFT EXTRACORPOREAL SHOCK WAVE LITHOTRIPSY (ESWL);  Surgeon: Cleon Gustin, MD;   Location: [facility];  Service: Urology;  Laterality: Left;  . FINGER SURGERY Right    middle  . TONSILLECTOMY    . WISDOM TOOTH EXTRACTION         Home Medications    Prior to Admission medications   Medication Sig Start Date End Date Taking? Authorizing Provider  blood glucose meter kit and supplies Dispense based on patient and insurance preference. Use up to four times daily as directed. (FOR ICD-10 E10.9, E11.9). 05/08/19  Yes Hawks, Christy A, FNP  busPIRone (BUSPAR) 7.5 MG tablet Take 1 tablet (7.5 mg total) by mouth 3 (three) times daily. 04/15/20  Yes Hawks, Christy A, FNP  Continuous Blood Gluc Sensor ([place]) MISC Needs to check BS QID 04/11/20  Yes Hawks, Alyse Low A, FNP  escitalopram (LEXAPRO) 20 MG tablet TAKE 1 TABLET BY MOUTH EVERY DAY 11/08/19  Yes [name] A, FNP  metFORMIN (GLUCOPHAGE XR) 750 MG 24 hr tablet Take 2 tablets (1,500 mg total) by mouth daily with breakfast. 01/31/20 01/30/21 Yes Hawks, Christy A, FNP  nystatin (MYCOSTATIN/NYSTOP) powder Apply 1 application topically 3 (three) times daily. 08/01/20  Yes Hawks, Christy A, FNP  nystatin cream (MYCOSTATIN) Apply 1 application topically 2 (two) times daily. 08/01/20  Yes Hawks, Alyse Low A, FNP  omeprazole (PRILOSEC) 40 MG capsule TAKE 1 CAPSULE BY MOUTH EVERY DAY 05/22/20  Yes Hawks, Theador Hawthorne, FNP  OneTouch Delica Lancets 50N MISC USE TO CHECK SUGAR UP TO 4  TIMES DAILY 05/08/19  Yes [provider]  Semaglutide,0.25 or 0.5MG/DOS, (OZEMPIC, 0.25 OR 0.5 MG/DOSE,) 2 MG/1.5ML SOPN Inject 0.25 mg into the skin once a week for 28 days, THEN 0.5 mg once a week for 28 days. 08/01/20 09/26/20 Yes Hawks, Christy A, FNP  sucralfate (CARAFATE) 1 g tablet Take 1 tablet (1 g total) by mouth 4 (four) times daily -  with meals and at bedtime. 08/19/20 09/18/20 Yes Faustino Congress, NP  atorvastatin (LIPITOR) 20 MG tablet Take 1 tablet (20 mg total) by mouth daily. Patient not taking: No sig reported 04/11/20    [name] A, FNP  doxycycline (VIBRA-TABS) 100 MG tablet Take 1 tablet (100 mg total) by mouth 2 (two) times daily. 08/01/20   Sharion Balloon, FNP  gabapentin (NEURONTIN) 100 MG capsule Take 1 capsule (100 mg total) by mouth 3 (three) times daily. Patient not taking: No sig reported 06/24/20   [name] A, FNP  glucose blood (ONETOUCH VERIO) test strip Use to check blood sugar up to 4 times a day Dx: E11.9 04/19/20   [name] A, FNP  lisinopril (ZESTRIL) 20 MG tablet Take 1 tablet (20 mg total) by mouth daily. 08/01/20   Sharion Balloon, FNP    Family History History reviewed. No pertinent family history.  Social History Social History   Tobacco Use  . Smoking status: Never Smoker  . Smokeless tobacco: Never Used  Vaping Use  . Vaping Use: Never used  Substance Use Topics  . Alcohol use: No  . Drug use: Yes    Types: Marijuana     Allergies   Jardiance [empagliflozin]   Review of Systems Review of Systems  Gastrointestinal: Positive for abdominal pain.     Physical Exam Triage Vital Signs ED Triage Vitals  Enc Vitals Group     BP 08/19/20 1417 134/85     Pulse Rate 08/19/20 1417 78     Resp --      Temp 08/19/20 1417 97.9 F (36.6 C)     Temp Source 08/19/20 1417 Oral     SpO2 08/19/20 1417 97 %     Weight 08/19/20 1415 (!) 315 lb (142.9 kg)     Height 08/19/20 1415 _0  (1.778 m)     Head Circumference --      Peak Flow --      Pain Score 08/19/20 1413 10     Pain Loc --      Pain Edu? --      Excl. in [place]? --    No data found.  Updated Vital Signs BP 134/85 (BP Location: Right Arm)   Pulse 78   Temp 97.9 F (36.6 C) (Oral)   Ht _1  (1.778 m)   Wt (!) 315 lb (142.9 kg)   SpO2 97%   BMI 45.20 kg/m       Physical Exam Vitals and nursing note reviewed.  Constitutional:      General: He is not in acute distress.    Appearance: He is well-developed. He is obese. He is ill-appearing.  HENT:     Head: Normocephalic and  atraumatic.     Nose: Nose normal.     Mouth/Throat:     Mouth: Mucous membranes are moist.     Pharynx: Oropharynx is clear.  Eyes:     Extraocular Movements: Extraocular movements intact.     Conjunctiva/sclera: Conjunctivae normal.     Pupils: Pupils are equal, round, and reactive to light.  Cardiovascular:     Rate and Rhythm: Normal rate and regular rhythm.  Pulmonary:     Effort: Pulmonary effort is normal.  Abdominal:     Palpations: Abdomen is soft.     Tenderness: There is abdominal tenderness (epigastric).  Musculoskeletal:        General: Normal range of motion.     Cervical back: Normal range of motion and neck supple.  Lymphadenopathy:     Cervical: No cervical adenopathy.  Skin:    General: Skin is warm and dry.     Capillary Refill: Capillary refill takes less than 2 seconds.  Neurological:     General: No focal deficit present.     Mental Status: He is alert and oriented to person, place, and time.  Psychiatric:        Mood and Affect: Mood normal.        Behavior: Behavior normal.        Thought Content: Thought content normal.      UC Treatments / Results  Labs (all labs ordered are listed, but only abnormal results are displayed) Labs Reviewed - No data to display  EKG   Radiology No results found.  Procedures Procedures (including critical care time)  Medications Ordered in UC Medications  alum & mag hydroxide-simeth (MAALOX/MYLANTA) 200-200-20 MG/5ML suspension 30 mL (has no administration in time range)    And  lidocaine (XYLOCAINE) 2 % viscous mouth solution 15 mL (15 mLs Oral Given 08/19/20 1432)    Initial Impression / Assessment and Plan / UC Course  I have reviewed the triage vital signs and the nursing notes.  Pertinent labs & imaging results that were available during my care of the patient were reviewed by me and considered in my medical decision making (see chart for details).    Epigastric Pain  We have given you a GI  cocktail in the office  I have sent in carafate for you to take three times per day with meals and before bed Possibly related to change in dose of ozempic Follow up with this office or with primary care if symptoms are persisting. Follow up in the ER for high fever, trouble swallowing, trouble breathing, other concerning symptoms.   Final Clinical Impressions(s) / UC Diagnoses   Final diagnoses:  Abdominal pain, epigastric     Discharge Instructions     We have given you a GI cocktail in the office to help with your pain  I have prescribed carafate for you to take three times per day with meals and before bedtime  This could be related to the change in your ozempic dose  Follow up with this office or with primary care if symptoms are persisting.  Follow up in the ER for high fever, trouble swallowing, trouble breathing, other concerning symptoms.     ED Prescriptions    Medication Sig Dispense Auth. Provider   sucralfate (CARAFATE) 1 g tablet Take 1 tablet (1 g total) by mouth 4 (four) times daily -  with meals and at bedtime. 120 tablet Faustino Congress, NP     PDMP not reviewed this encounter.   Faustino Congress, NP 08/19/20 [place]    Faustino Congress, NP 08/19/20 1456

## 2020-08-19 NOTE — Discharge Instructions (Signed)
We have given you a GI cocktail in the office to help with your pain  I have prescribed carafate for you to take three times per day with meals and before bedtime  This could be related to the change in your ozempic dose  Follow up with this office or with primary care if symptoms are persisting.  Follow up in the ER for high fever, trouble swallowing, trouble breathing, other concerning symptoms.

## 2020-08-25 ENCOUNTER — Other Ambulatory Visit: Payer: Self-pay | Admitting: Family

## 2020-08-25 DIAGNOSIS — W57XXXA Bitten or stung by nonvenomous insect and other nonvenomous arthropods, initial encounter: Secondary | ICD-10-CM

## 2020-08-25 DIAGNOSIS — S20469A Insect bite (nonvenomous) of unspecified back wall of thorax, initial encounter: Secondary | ICD-10-CM

## 2020-09-24 ENCOUNTER — Other Ambulatory Visit: Payer: Self-pay

## 2020-09-24 ENCOUNTER — Ambulatory Visit (INDEPENDENT_AMBULATORY_CARE_PROVIDER_SITE_OTHER): Payer: 59 | Admitting: Cardiology

## 2020-09-24 VITALS — BP 144/78 | HR 77 | Ht 71.0 in | Wt 327.0 lb

## 2020-09-24 DIAGNOSIS — E782 Mixed hyperlipidemia: Secondary | ICD-10-CM

## 2020-09-24 DIAGNOSIS — R0789 Other chest pain: Secondary | ICD-10-CM | POA: Diagnosis not present

## 2020-09-24 DIAGNOSIS — I10 Essential (primary) hypertension: Secondary | ICD-10-CM | POA: Diagnosis not present

## 2020-09-24 MED ORDER — PRAVASTATIN SODIUM 20 MG PO TABS: 20.0000 mg | ORAL_TABLET | Freq: Every evening | ORAL | 3 refills | Status: DC

## 2020-09-24 MED ORDER — LOSARTAN POTASSIUM 25 MG PO TABS: 25.0000 mg | ORAL_TABLET | Freq: Every day | ORAL | 3 refills | Status: DC

## 2020-09-24 NOTE — Progress Notes (Signed)
Clinical Summary Mr. Silsby is a 45 y.o.maleseen today as a new consult, referred by NP Ijaola for chest pain.   1. Chest pain - left sided, pressure. 7/10 in severity at his worst. +nausea. Mild SOB. Can have Numbness down left arm elbow down all fingers except pinky. Can occur at any time. Lasts about 5-10 minutes. Not positional. Some increase in frequency, on average few times week. Can occur with feeling anxious. No relation to food   - can have some occasional DOE - pcp EKG SR, lateral TWIs. CAD: diet controlled DM2, sister with CHF but unknown cause, father age 42 and CABG in his 11s, elevated bp    12/2019 nuclear stress: very small apical defect that is reversible. Reported as intermediate risk.  - no recent stress pain, reports low stress and has improved.    2. Generalized anxiety disorder - followed by pcp   3. DM2 - followed by pcp, last Hgb A1c 6.5   4. HTN - reported cough on lisinopril, changed to losartan - has not been taking losartan, denies any side effects   5. Hyperlipidemia - reported headaches on atorvastatin, PA Leonides Sake had previously changed him to simvastatin but did not like taking.   05/2019 LDL 122  SH: owns tire shop  Past Medical History:  Diagnosis Date   Anxiety    Complication of anesthesia    had hard time waking patient up in 2000   Dyspnea    increased exertion    History of kidney stones    Kidney stone 12/26/2017     Allergies  Allergen Reactions   Jardiance [Empagliflozin] Rash     Current Outpatient Medications  Medication Sig Dispense Refill   atorvastatin (LIPITOR) 20 MG tablet Take 1 tablet (20 mg total) by mouth daily. (Patient not taking: No sig reported) 90 tablet 4   blood glucose meter kit and supplies Dispense based on patient and insurance preference. Use up to four times daily as directed. (FOR ICD-10 E10.9, E11.9). 1 each 0   busPIRone (BUSPAR) 7.5 MG tablet Take 1 tablet (7.5 mg total) by mouth 3 (three)  times daily. 30 tablet 0   Continuous Blood Gluc Sensor ([place]) MISC Needs to check BS QID 1 each 6   doxycycline (VIBRA-TABS) 100 MG tablet Take 1 tablet (100 mg total) by mouth 2 (two) times daily. 42 tablet 0   escitalopram (LEXAPRO) 20 MG tablet TAKE 1 TABLET BY MOUTH EVERY DAY 90 tablet 0   gabapentin (NEURONTIN) 100 MG capsule Take 1 capsule (100 mg total) by mouth 3 (three) times daily. (Patient not taking: No sig reported) 90 capsule 3   glucose blood (ONETOUCH VERIO) test strip Use to check blood sugar up to 4 times a day Dx: E11.9 100 strip 5   lisinopril (ZESTRIL) 20 MG tablet Take 1 tablet (20 mg total) by mouth daily. 90 tablet 3   metFORMIN (GLUCOPHAGE XR) 750 MG 24 hr tablet Take 2 tablets (1,500 mg total) by mouth daily with breakfast. 180 tablet 2   nystatin (MYCOSTATIN/NYSTOP) powder Apply 1 application topically 3 (three) times daily. 120 g 2   nystatin cream (MYCOSTATIN) Apply 1 application topically 2 (two) times daily. 30 g 1   omeprazole (PRILOSEC) 40 MG capsule TAKE 1 CAPSULE BY MOUTH EVERY DAY 90 capsule 2   OneTouch Delica Lancets 41R MISC USE TO CHECK SUGAR UP TO 4 TIMES DAILY     Semaglutide,0.25 or 0.5MG/DOS, (OZEMPIC, 0.25  OR 0.5 MG/DOSE,) 2 MG/1.5ML SOPN Inject 0.25 mg into the skin once a week for 28 days, THEN 0.5 mg once a week for 28 days. 1.5 mL 2   sucralfate (CARAFATE) 1 g tablet Take 1 tablet (1 g total) by mouth 4 (four) times daily -  with meals and at bedtime. 120 tablet 0   No current facility-administered medications for this visit.     Past Surgical History:  Procedure Laterality Date   CHOLECYSTECTOMY  11/24/2017   LAPROSCOPIC   CHOLECYSTECTOMY N/A 11/24/2017   Procedure: LAPAROSCOPIC CHOLECYSTECTOMY;  Surgeon: Coralie Keens, MD;  Location: [facility];  Service: General;  Laterality: N/A;   ELBOW SURGERY Right    EXTRACORPOREAL SHOCK WAVE LITHOTRIPSY Left 10/27/2017   Procedure: LEFT EXTRACORPOREAL SHOCK WAVE LITHOTRIPSY  (ESWL);  Surgeon: Cleon Gustin, MD;  Location: [facility];  Service: Urology;  Laterality: Left;   FINGER SURGERY Right    middle   TONSILLECTOMY     WISDOM TOOTH EXTRACTION       Allergies  Allergen Reactions   Jardiance [Empagliflozin] Rash      No family history on file.   Social History Mr. Czaja reports that he has never smoked. He has never used smokeless tobacco. Mr. Nazar reports no history of alcohol use.   Review of Systems CONSTITUTIONAL: No weight loss, fever, chills, weakness or fatigue.  HEENT: Eyes: No visual loss, blurred vision, double vision or yellow sclerae.No hearing loss, sneezing, congestion, runny nose or sore throat.  SKIN: No rash or itching.  CARDIOVASCULAR: pe rhpi RESPIRATORY: No shortness of breath, cough or sputum.  GASTROINTESTINAL: No anorexia, nausea, vomiting or diarrhea. No abdominal pain or blood.  GENITOURINARY: No burning on urination, no polyuria NEUROLOGICAL: No headache, dizziness, syncope, paralysis, ataxia, numbness or tingling in the extremities. No change in bowel or bladder control.  MUSCULOSKELETAL: No muscle, back pain, joint pain or stiffness.  LYMPHATICS: No enlarged nodes. No history of splenectomy.  PSYCHIATRIC: No history of depression or anxiety.  ENDOCRINOLOGIC: No reports of sweating, cold or heat intolerance. No polyuria or polydipsia.  Marland Kitchen   Physical Examination Today's Vitals   09/24/20 0823  BP: (!) 144/78  Pulse: 77  Weight: (!) 327 lb (148.3 kg)  Height: _0  (1.803 m)   Body mass index is 45.61 kg/m.  Gen: resting comfortably, no acute distress HEENT: no scleral icterus, pupils equal round and reactive, no palptable cervical adenopathy,  CV: RRR, no m/r/g no jvd Resp: Clear to auscultation bilaterally GI: abdomen is soft, non-tender, non-distended, normal bowel sounds, no hepatosplenomegaly MSK: extremities are warm, no edema.  Skin: warm, no rash Neuro:  no focal deficits Psych: appropriate  affect   Diagnostic Studies 12/2019 stress No diagnostic ST segment changes to indicate ischemia. Very small, apical anteroseptal defect that is reversible. Unusual distribution for major epicardial vessel but cannot exclude a small ischemic territory. TID ratio 1.27, consider the possibility of balanced subendocardial ischemia as well in the setting of type 2 diabetes mellitus. This is an intermediate risk study. Nuclear stress EF: 51%.    Assessment and Plan   1. Chest pain - mixed symptoms, some atypical features - overall low risk stress test. Symptoms have resolved - continue to monitor.   EKG today shows SR, no ischemic changes   2. Hyperlipidemia - report side effects on atorva and simva, try pravastatin 41m daily.      3. HTN - above goal, has not been taking his losartan. Encouraged to restart  F/u 6 months    Arnoldo Lenis, M.D.

## 2020-09-24 NOTE — Patient Instructions (Addendum)
Medication Instructions:  Your physician has recommended you make the following change in your medication:  Restart losartan 25 mg by mouth daily Start pravastatin 20 mg by mouth daily at bedtime Continue other medications the same  Labwork: none  Testing/Procedures: none  Follow-Up: Your physician recommends that you schedule a follow-up appointment in: 6 months  Any Other Special Instructions Will Be Listed Below (If Applicable).  If you need a refill on your cardiac medications before your next appointment, please call your pharmacy.

## 2020-10-01 ENCOUNTER — Encounter: Payer: Self-pay | Admitting: Family

## 2020-10-01 ENCOUNTER — Other Ambulatory Visit: Payer: Self-pay

## 2020-10-01 ENCOUNTER — Ambulatory Visit (INDEPENDENT_AMBULATORY_CARE_PROVIDER_SITE_OTHER): Payer: 59 | Admitting: Family

## 2020-10-01 VITALS — BP 129/79 | HR 71 | Temp 97.3°F | Ht 71.0 in | Wt 323.4 lb

## 2020-10-01 DIAGNOSIS — E785 Hyperlipidemia, unspecified: Secondary | ICD-10-CM

## 2020-10-01 DIAGNOSIS — E1169 Type 2 diabetes mellitus with other specified complication: Secondary | ICD-10-CM

## 2020-10-01 MED ORDER — SEMAGLUTIDE (1 MG/DOSE) 4 MG/3ML ~~LOC~~ SOPN: 1.0000 mg | PEN_INJECTOR | SUBCUTANEOUS | 6 refills | Status: DC

## 2020-10-01 MED ORDER — ROSUVASTATIN CALCIUM 5 MG PO TABS: 5.0000 mg | ORAL_TABLET | Freq: Every day | ORAL | 3 refills | Status: DC

## 2020-10-01 NOTE — Progress Notes (Signed)
Subjective:    Patient ID: Ernest Miller, male    DOB: Feb 17, 1976, 45 y.o.   MRN: [id number]  Chief Complaint  Patient presents with   Diabetes    2 mth rck he is on ozempic 05 sugar is all over the place states he needs to go up   Pt presents to the office today for diabetic follow up.  Diabetes He presents for his follow-up diabetic visit. He has type 2 diabetes mellitus. There are no hypoglycemic associated symptoms. Pertinent negatives for diabetes include no blurred vision and no foot paresthesias. Symptoms are stable. Risk factors for coronary artery disease include dyslipidemia, diabetes mellitus, male sex, hypertension and sedentary lifestyle. He is following a generally unhealthy diet.  Hyperlipidemia This is a chronic problem. The current episode started more than 1 year ago. Exacerbating diseases include obesity. Current antihyperlipidemic treatment includes statins. The current treatment provides moderate improvement of lipids. Risk factors for coronary artery disease include dyslipidemia, hypertension and a sedentary lifestyle.     Review of Systems  Eyes:  Negative for blurred vision.  All other systems reviewed and are negative.     Objective:   Physical Exam Vitals reviewed.  Constitutional:      General: He is not in acute distress.    Appearance: He is well-developed. He is obese.  HENT:     Head: Normocephalic.     Right Ear: Tympanic membrane normal.     Left Ear: Tympanic membrane normal.  Eyes:     General:        Right eye: No discharge.        Left eye: No discharge.     Pupils: Pupils are equal, round, and reactive to light.  Neck:     Thyroid: No thyromegaly.  Cardiovascular:     Rate and Rhythm: Normal rate and regular rhythm.     Heart sounds: Normal heart sounds. No murmur heard. Pulmonary:     Effort: Pulmonary effort is normal. No respiratory distress.     Breath sounds: Normal breath sounds. No wheezing.  Abdominal:     General: Bowel  sounds are normal. There is no distension.     Palpations: Abdomen is soft.     Tenderness: There is no abdominal tenderness.  Musculoskeletal:        General: No tenderness. Normal range of motion.     Cervical back: Normal range of motion and neck supple.  Skin:    General: Skin is warm and dry.     Findings: No erythema or rash.  Neurological:     Mental Status: He is alert and oriented to person, place, and time.     Cranial Nerves: No cranial nerve deficit.     Deep Tendon Reflexes: Reflexes are normal and symmetric.  Psychiatric:        Behavior: Behavior normal.        Thought Content: Thought content normal.        Judgment: Judgment normal.      BP 129/79   Pulse 71   Temp (!) 97.3 F (36.3 C) (Temporal)   Ht $R'5\' 11"'dY$  (1.803 m)   Wt (!) 323 lb 6.4 oz (146.7 kg)   BMI 45.11 kg/m      Assessment & Plan:  Ernest Miller comes in today with chief complaint of Diabetes (2 mth rck he is on ozempic 05 sugar is all over the place states he needs to go up)   Diagnosis and orders  addressed:  1. Type 2 diabetes mellitus with other specified complication, without long-term current use of insulin (HCC) Will increase Ozempic to 1 mg from 0.5 mg Strict low carb diet Encourage healthy diet and exercise - Semaglutide, 1 MG/DOSE, 4 MG/3ML SOPN; Inject 1 mg into the skin once a week.  Dispense: 12 mL; Refill: 6 - CMP14+EGFR  2. Morbid obesity ([place]) - CMP14+EGFR  3. Hyperlipidemia associated with type 2 diabetes mellitus (HCC) Will start Crestor 5 mg - rosuvastatin (CRESTOR) 5 MG tablet; Take 1 tablet (5 mg total) by mouth daily.  Dispense: 90 tablet; Refill: 3   Labs pending Health Maintenance reviewed Diet and exercise encouraged  Follow up plan: 3 months    [name], FNP

## 2020-10-01 NOTE — Patient Instructions (Signed)
Diabetes Mellitus and Nutrition, Adult When you have diabetes, or diabetes mellitus, it is very important to have healthy eating habits because your blood sugar (glucose) levels are greatly affected by what you eat and drink. Eating healthy foods in the right amounts, at about the same times every day, can help you:  Control your blood glucose.  Lower your risk of heart disease.  Improve your blood pressure.  Reach or maintain a healthy weight. What can affect my meal plan? Every person with diabetes is different, and each person has different needs for a meal plan. Your health care provider may recommend that you work with a dietitian to make a meal plan that is best for you. Your meal plan may vary depending on factors such as:  The calories you need.  The medicines you take.  Your weight.  Your blood glucose, blood pressure, and cholesterol levels.  Your activity level.  Other health conditions you have, such as heart or kidney disease. How do carbohydrates affect me? Carbohydrates, also called carbs, affect your blood glucose level more than any other type of food. Eating carbs naturally raises the amount of glucose in your blood. Carb counting is a method for keeping track of how many carbs you eat. Counting carbs is important to keep your blood glucose at a healthy level, especially if you use insulin or take certain oral diabetes medicines. It is important to know how many carbs you can safely have in each meal. This is different for every person. Your dietitian can help you calculate how many carbs you should have at each meal and for each snack. How does alcohol affect me? Alcohol can cause a sudden decrease in blood glucose (hypoglycemia), especially if you use insulin or take certain oral diabetes medicines. Hypoglycemia can be a life-threatening condition. Symptoms of hypoglycemia, such as sleepiness, dizziness, and confusion, are similar to symptoms of having too much  alcohol.  Do not drink alcohol if: ? Your health care provider tells you not to drink. ? You are pregnant, may be pregnant, or are planning to become pregnant.  If you drink alcohol: ? Do not drink on an empty stomach. ? Limit how much you use to:  0-1 drink a day for women.  0-2 drinks a day for men. ? Be aware of how much alcohol is in your drink. In the [place] one drink equals one 12 oz bottle of beer (355 mL), one 5 oz glass of wine (148 mL), or one 1 oz glass of hard liquor (44 mL). ? Keep yourself hydrated with water, diet soda, or unsweetened iced tea.  Keep in mind that regular soda, juice, and other mixers may contain a lot of sugar and must be counted as carbs. What are tips for following this plan? Reading food labels  Start by checking the serving size on the "Nutrition Facts" label of packaged foods and drinks. The amount of calories, carbs, fats, and other nutrients listed on the label is based on one serving of the item. Many items contain more than one serving per package.  Check the total grams (g) of carbs in one serving. You can calculate the number of servings of carbs in one serving by dividing the total carbs by 15. For example, if a food has 30 g of total carbs per serving, it would be equal to 2 servings of carbs.  Check the number of grams (g) of saturated fats and trans fats in one serving. Choose foods that have   a low amount or none of these fats.  Check the number of milligrams (mg) of salt (sodium) in one serving. Most people should limit total sodium intake to less than 2,300 mg per day.  Always check the nutrition information of foods labeled as "low-fat" or "nonfat." These foods may be higher in added sugar or refined carbs and should be avoided.  Talk to your dietitian to identify your daily goals for nutrients listed on the label. Shopping  Avoid buying canned, pre-made, or processed foods. These foods tend to be high in fat, sodium, and added  sugar.  Shop around the outside edge of the grocery store. This is where you will most often find fresh fruits and vegetables, bulk grains, fresh meats, and fresh dairy. Cooking  Use low-heat cooking methods, such as baking, instead of high-heat cooking methods like deep frying.  Cook using healthy oils, such as olive, canola, or sunflower oil.  Avoid cooking with butter, cream, or high-fat meats. Meal planning  Eat meals and snacks regularly, preferably at the same times every day. Avoid going long periods of time without eating.  Eat foods that are high in fiber, such as fresh fruits, vegetables, beans, and whole grains. Talk with your dietitian about how many servings of carbs you can eat at each meal.  Eat 4-6 oz (112-168 g) of lean protein each day, such as lean meat, chicken, fish, eggs, or tofu. One ounce (oz) of lean protein is equal to: ? 1 oz (28 g) of meat, chicken, or fish. ? 1 egg. ?  cup (62 g) of tofu.  Eat some foods each day that contain healthy fats, such as avocado, nuts, seeds, and fish.   What foods should I eat? Fruits Berries. Apples. Oranges. Peaches. Apricots. Plums. Grapes. Mango. Papaya. Pomegranate. Kiwi. Cherries. Vegetables Lettuce. Spinach. Leafy greens, including kale, chard, collard greens, and mustard greens. Beets. Cauliflower. Cabbage. Broccoli. Carrots. Green beans. Tomatoes. Peppers. Onions. Cucumbers. Brussels sprouts. Grains Whole grains, such as whole-wheat or whole-grain bread, crackers, tortillas, cereal, and pasta. Unsweetened oatmeal. Quinoa. Brown or wild rice. Meats and other proteins Seafood. Poultry without skin. Lean cuts of poultry and beef. Tofu. Nuts. Seeds. Dairy Low-fat or fat-free dairy products such as milk, yogurt, and cheese. The items listed above may not be a complete list of foods and beverages you can eat. Contact a dietitian for more information. What foods should I avoid? Fruits Fruits canned with  syrup. Vegetables Canned vegetables. Frozen vegetables with butter or cream sauce. Grains Refined white flour and flour products such as bread, pasta, snack foods, and cereals. Avoid all processed foods. Meats and other proteins Fatty cuts of meat. Poultry with skin. Breaded or fried meats. Processed meat. Avoid saturated fats. Dairy Full-fat yogurt, cheese, or milk. Beverages Sweetened drinks, such as soda or iced tea. The items listed above may not be a complete list of foods and beverages you should avoid. Contact a dietitian for more information. Questions to ask a health care provider  Do I need to meet with a diabetes educator?  Do I need to meet with a dietitian?  What number can I call if I have questions?  When are the best times to check my blood glucose? Where to find more information:  American Diabetes Association: diabetes.org  Academy of Nutrition and Dietetics: www.eatright.org  National Institute of Diabetes and Digestive and Kidney Diseases: www.niddk.nih.gov  Association of Diabetes Care and Education Specialists: www.diabeteseducator.org Summary  It is important to have healthy eating   habits because your blood sugar (glucose) levels are greatly affected by what you eat and drink.  A healthy meal plan will help you control your blood glucose and maintain a healthy lifestyle.  Your health care provider may recommend that you work with a dietitian to make a meal plan that is best for you.  Keep in mind that carbohydrates (carbs) and alcohol have immediate effects on your blood glucose levels. It is important to count carbs and to use alcohol carefully. This information is not intended to replace advice given to you by your health care provider. Make sure you discuss any questions you have with your health care provider. Document Revised: 02/27/2019 Document Reviewed: 02/27/2019 Elsevier Patient Education  2021 Elsevier Inc.  

## 2020-10-29 ENCOUNTER — Other Ambulatory Visit: Payer: Self-pay | Admitting: Family

## 2020-10-29 DIAGNOSIS — E1169 Type 2 diabetes mellitus with other specified complication: Secondary | ICD-10-CM

## 2020-11-10 ENCOUNTER — Encounter: Payer: Self-pay | Admitting: Family

## 2020-11-10 ENCOUNTER — Other Ambulatory Visit: Payer: Self-pay

## 2020-11-10 ENCOUNTER — Ambulatory Visit (INDEPENDENT_AMBULATORY_CARE_PROVIDER_SITE_OTHER): Payer: 59 | Admitting: Family

## 2020-11-10 VITALS — BP 135/89 | HR 76 | Temp 98.5°F | Ht 71.0 in | Wt 317.2 lb

## 2020-11-10 DIAGNOSIS — L739 Follicular disorder, unspecified: Secondary | ICD-10-CM

## 2020-11-10 DIAGNOSIS — B3789 Other sites of candidiasis: Secondary | ICD-10-CM

## 2020-11-10 MED ORDER — CEPHALEXIN 500 MG PO CAPS: 500.0000 mg | ORAL_CAPSULE | Freq: Three times a day (TID) | ORAL | 0 refills | Status: DC

## 2020-11-10 MED ORDER — NYSTATIN 100000 UNIT/GM EX POWD: 1.0000 "application " | Freq: Three times a day (TID) | CUTANEOUS | 2 refills | Status: DC

## 2020-11-10 NOTE — Progress Notes (Signed)
Subjective:    Patient ID: Ernest Miller, male    DOB: 05/27/1975, 45 y.o.   MRN: [id number]  Chief Complaint  Patient presents with   Rash    All over in patches     Rash This is a recurrent problem. The current episode started more than 1 month ago. The problem has been waxing and waning since onset. Location: buttocks and right knee. The rash is characterized by itchiness, dryness and redness. Associated with: sweating.  Diabetes He presents for his follow-up diabetic visit. He has type 2 diabetes mellitus. Pertinent negatives for diabetes include no blurred vision and no foot paresthesias. Symptoms are stable. Pertinent negatives for diabetic complications include no heart disease. Risk factors for coronary artery disease include dyslipidemia, diabetes mellitus, male sex, obesity, hypertension and sedentary lifestyle. He is following a generally unhealthy diet. His overall blood glucose range is 90-110 mg/dl.     Review of Systems  Eyes:  Negative for blurred vision.  Skin:  Positive for rash.  All other systems reviewed and are negative.     Objective:   Physical Exam Vitals reviewed.  Constitutional:      General: He is not in acute distress.    Appearance: He is well-developed. He is obese.  HENT:     Head: Normocephalic.  Eyes:     General:        Right eye: No discharge.        Left eye: No discharge.     Pupils: Pupils are equal, round, and reactive to light.  Neck:     Thyroid: No thyromegaly.  Cardiovascular:     Rate and Rhythm: Normal rate and regular rhythm.     Heart sounds: Normal heart sounds. No murmur heard. Pulmonary:     Effort: Pulmonary effort is normal. No respiratory distress.     Breath sounds: Normal breath sounds. No wheezing.  Abdominal:     General: Bowel sounds are normal. There is no distension.     Palpations: Abdomen is soft.     Tenderness: There is no abdominal tenderness.  Musculoskeletal:        General: No tenderness. Normal  range of motion.     Cervical back: Normal range of motion and neck supple.  Skin:    General: Skin is warm and dry.     Findings: Rash present. No erythema.          Comments: Scabbed pustules on posterior left knee and buttocks.    Neurological:     Mental Status: He is alert and oriented to person, place, and time.     Cranial Nerves: No cranial nerve deficit.     Deep Tendon Reflexes: Reflexes are normal and symmetric.  Psychiatric:        Behavior: Behavior normal.        Thought Content: Thought content normal.        Judgment: Judgment normal.      BP 135/89   Pulse 76   Temp 98.5 F (36.9 C) (Temporal)   Ht 5\' 11"  (1.803 m)   Wt (!) 317 lb 3.2 oz (143.9 kg)   BMI 44.24 kg/m      Assessment & Plan:  Ernest Miller comes in today with chief complaint of Rash (All over in patches )   Diagnosis and orders addressed:  1. Candida rash of groin - nystatin (MYCOSTATIN/NYSTOP) powder; Apply 1 application topically 3 (three) times daily.  Dispense: 120 g; Refill: 2  2. Folliculitis - cephALEXin (KEFLEX) 500 MG capsule; Take 1 capsule (500 mg total) by mouth 3 (three) times daily.  Dispense: 21 capsule; Refill: 0   Start Keflex.  Continue to use powder to help with area being moist.  Keep clean and dry Avoid scratching   Jannifer Rodney, FNP

## 2020-11-10 NOTE — Patient Instructions (Signed)
Folliculitis Folliculitis is inflammation of the hair follicles. Folliculitis most commonly occurs on the scalp, thighs, legs, back, and buttocks. However, it can occur anywhere on the body. What are the causes? This condition may be caused by: A bacterial infection (common). A fungal infection. A viral infection. Contact with certain chemicals, especially oils and tars. Shaving or waxing. Greasy ointments or creams applied to the skin. Long-lasting folliculitis and folliculitis that keeps coming back may be caused by bacteria. This bacteria can live anywhere on your skin and is often found in the nostrils. What increases the risk? You are more likely to develop this condition if you have: A weakened immune system. Diabetes. Obesity. What are the signs or symptoms? Symptoms of this condition include: Redness. Soreness. Swelling. Itching. Small white or yellow, pus-filled, itchy spots (pustules) that appear over a reddened area. If there is an infection that goes deep into the follicle, these may develop into a boil (furuncle). A group of closely packed boils (carbuncle). These tend to form in hairy, sweaty areas of the body. How is this diagnosed? This condition is diagnosed with a skin exam. To find what is causing the condition, your health care provider may take a sample of one of the pustules or boils for testing in a lab. How is this treated? This condition may be treated by: Applying warm compresses to the affected areas. Taking an antibiotic medicine or applying an antibiotic medicine to the skin. Applying or bathing with an antiseptic solution. Taking an over-the-counter medicine to help with itching. Having a procedure to drain any pustules or boils. This may be done if a pustule or boil contains a lot of pus or fluid. Having laser hair removal. This may be done to treat long-lasting folliculitis. Follow these instructions at home: Managing pain and swelling  If  directed, apply heat to the affected area as often as told by your health care provider. Use the heat source that your health care provider recommends, such as a moist heat pack or a heating pad. Place a towel between your skin and the heat source. Leave the heat on for 20-30 minutes. Remove the heat if your skin turns bright red. This is especially important if you are unable to feel pain, heat, or cold. You may have a greater risk of getting burned. General instructions If you were prescribed an antibiotic medicine, take it or apply it as told by your health care provider. Do not stop using the antibiotic even if your condition improves. Check the irritated area every day for signs of infection. Check for: Redness, swelling, or pain. Fluid or blood. Warmth. Pus or a bad smell. Do not shave irritated skin. Take over-the-counter and prescription medicines only as told by your health care provider. Keep all follow-up visits as told by your health care provider. This is important. Get help right away if: You have more redness, swelling, or pain in the affected area. Red streaks are spreading from the affected area. You have a fever. Summary Folliculitis is inflammation of the hair follicles. Folliculitis most commonly occurs on the scalp, thighs, legs, back, and buttocks. This condition may be treated by taking an antibiotic medicine or applying an antibiotic medicine to the skin, and applying or bathing with an antiseptic solution. If you were prescribed an antibiotic medicine, take it or apply it as told by your health care provider. Do not stop using the antibiotic even if your condition improves. Get help right away if you have new or   worsening symptoms. Keep all follow-up visits as told by your health care provider. This is important. This information is not intended to replace advice given to you by your health care provider. Make sure you discuss any questions you have with your health  care provider. Document Revised: 10/21/2017 Document Reviewed: 10/29/2017 Elsevier Patient Education  2022 [employer]  

## 2020-12-19 ENCOUNTER — Encounter: Payer: Self-pay | Admitting: Family

## 2020-12-19 ENCOUNTER — Other Ambulatory Visit: Payer: Self-pay

## 2020-12-19 ENCOUNTER — Ambulatory Visit (INDEPENDENT_AMBULATORY_CARE_PROVIDER_SITE_OTHER): Payer: 59 | Admitting: Family

## 2020-12-19 ENCOUNTER — Other Ambulatory Visit: Payer: Self-pay | Admitting: Family

## 2020-12-19 VITALS — BP 152/101 | HR 92 | Temp 97.9°F | Resp 20 | Ht 71.0 in | Wt 305.0 lb

## 2020-12-19 DIAGNOSIS — B372 Candidiasis of skin and nail: Secondary | ICD-10-CM

## 2020-12-19 DIAGNOSIS — E1169 Type 2 diabetes mellitus with other specified complication: Secondary | ICD-10-CM | POA: Diagnosis not present

## 2020-12-19 DIAGNOSIS — I10 Essential (primary) hypertension: Secondary | ICD-10-CM | POA: Diagnosis not present

## 2020-12-19 MED ORDER — NYSTATIN 100000 UNIT/GM EX POWD: 1.0000 "application " | Freq: Three times a day (TID) | CUTANEOUS | 2 refills | Status: DC

## 2020-12-19 MED ORDER — TIRZEPATIDE 5 MG/0.5ML ~~LOC~~ SOAJ: 5.0000 mg | SUBCUTANEOUS | 2 refills | Status: DC

## 2020-12-19 NOTE — Progress Notes (Signed)
Subjective:    Patient ID: Ernest Miller, male    DOB: 1976/03/02, 45 y.o.   MRN: [id number]  Chief Complaint  Patient presents with   Rash   PT presents to the office today for recurrent rash on posterior knee and buttocks. He states he has had this rash since starting Ozmepic. He has taken nystatin powder, cream, and Keflex with mild relief.   He states the rash gets worse when he gets sweaty.  Rash This is a recurrent problem. The current episode started more than 1 month ago. The problem has been waxing and waning since onset. The affected locations include the left buttock (posterior knee). The rash is characterized by redness and itchiness. He was exposed to a new medication. Pertinent negatives include no shortness of breath.  Hypertension This is a chronic problem. The current episode started more than 1 year ago. The problem is unchanged. The problem is uncontrolled. Pertinent negatives include no malaise/fatigue, peripheral edema or shortness of breath. Past treatments include nothing. The current treatment provides no improvement.     Review of Systems  Constitutional:  Negative for malaise/fatigue.  Respiratory:  Negative for shortness of breath.   Skin:  Positive for rash.  All other systems reviewed and are negative.     Objective:   Physical Exam Vitals reviewed.  Constitutional:      General: He is not in acute distress.    Appearance: He is well-developed. He is obese.  HENT:     Head: Normocephalic.  Eyes:     General:        Right eye: No discharge.        Left eye: No discharge.     Pupils: Pupils are equal, round, and reactive to light.  Neck:     Thyroid: No thyromegaly.  Cardiovascular:     Rate and Rhythm: Normal rate and regular rhythm.     Heart sounds: Normal heart sounds. No murmur heard. Pulmonary:     Effort: Pulmonary effort is normal. No respiratory distress.     Breath sounds: Normal breath sounds. No wheezing.  Abdominal:     General:  Bowel sounds are normal. There is no distension.     Palpations: Abdomen is soft.     Tenderness: There is no abdominal tenderness.  Musculoskeletal:        General: No tenderness. Normal range of motion.     Cervical back: Normal range of motion and neck supple.  Skin:    General: Skin is warm and dry.     Findings: Erythema and rash present.       Neurological:     Mental Status: He is alert and oriented to person, place, and time.     Cranial Nerves: No cranial nerve deficit.     Deep Tendon Reflexes: Reflexes are normal and symmetric.  Psychiatric:        Behavior: Behavior normal.        Thought Content: Thought content normal.        Judgment: Judgment normal.      BP (!) 152/101   Pulse 92   Temp 97.9 F (36.6 C) (Temporal)   Resp 20   Ht 5\' 11"  (1.803 m)   Wt (!) 305 lb (138.3 kg)   SpO2 99%   BMI 42.54 kg/m      Assessment & Plan:  Ernest Miller comes in today with chief complaint of Rash   Diagnosis and orders addressed:  1.  Type 2 diabetes mellitus with other specified complication, without long-term current use of insulin (HCC) Stop Ozmepic and start Mounjaro 5 mg today Follow up in 2 months and will plan to increase dose Low carb diet Encourage healthy diet and exercise - tirzepatide (MOUNJARO) 5 MG/0.5ML Pen; Inject 5 mg into the skin once a week.  Dispense: 6 mL; Refill: 2  2. Candidal skin infection Continue Nystatin powder.    4. Primary hypertension Pt encouraged to restart Losartan. He states his BP is elevated today because of pain in finger.  -Exercise encouraged - Stress Management   5. Morbid obesity (HCC)     Jannifer Rodney, FNP

## 2020-12-19 NOTE — Patient Instructions (Signed)
Diabetes Mellitus and Nutrition, Adult When you have diabetes, or diabetes mellitus, it is very important to have healthy eating habits because your blood sugar (glucose) levels are greatly affected by what you eat and drink. Eating healthy foods in the right amounts, at about the same times every day, can help you:  Control your blood glucose.  Lower your risk of heart disease.  Improve your blood pressure.  Reach or maintain a healthy weight. What can affect my meal plan? Every person with diabetes is different, and each person has different needs for a meal plan. Your health care provider may recommend that you work with a dietitian to make a meal plan that is best for you. Your meal plan may vary depending on factors such as:  The calories you need.  The medicines you take.  Your weight.  Your blood glucose, blood pressure, and cholesterol levels.  Your activity level.  Other health conditions you have, such as heart or kidney disease. How do carbohydrates affect me? Carbohydrates, also called carbs, affect your blood glucose level more than any other type of food. Eating carbs naturally raises the amount of glucose in your blood. Carb counting is a method for keeping track of how many carbs you eat. Counting carbs is important to keep your blood glucose at a healthy level, especially if you use insulin or take certain oral diabetes medicines. It is important to know how many carbs you can safely have in each meal. This is different for every person. Your dietitian can help you calculate how many carbs you should have at each meal and for each snack. How does alcohol affect me? Alcohol can cause a sudden decrease in blood glucose (hypoglycemia), especially if you use insulin or take certain oral diabetes medicines. Hypoglycemia can be a life-threatening condition. Symptoms of hypoglycemia, such as sleepiness, dizziness, and confusion, are similar to symptoms of having too much  alcohol.  Do not drink alcohol if: ? Your health care provider tells you not to drink. ? You are pregnant, may be pregnant, or are planning to become pregnant.  If you drink alcohol: ? Do not drink on an empty stomach. ? Limit how much you use to:  0-1 drink a day for women.  0-2 drinks a day for men. ? Be aware of how much alcohol is in your drink. In the [place] one drink equals one 12 oz bottle of beer (355 mL), one 5 oz glass of wine (148 mL), or one 1 oz glass of hard liquor (44 mL). ? Keep yourself hydrated with water, diet soda, or unsweetened iced tea.  Keep in mind that regular soda, juice, and other mixers may contain a lot of sugar and must be counted as carbs. What are tips for following this plan? Reading food labels  Start by checking the serving size on the "Nutrition Facts" label of packaged foods and drinks. The amount of calories, carbs, fats, and other nutrients listed on the label is based on one serving of the item. Many items contain more than one serving per package.  Check the total grams (g) of carbs in one serving. You can calculate the number of servings of carbs in one serving by dividing the total carbs by 15. For example, if a food has 30 g of total carbs per serving, it would be equal to 2 servings of carbs.  Check the number of grams (g) of saturated fats and trans fats in one serving. Choose foods that have   a low amount or none of these fats.  Check the number of milligrams (mg) of salt (sodium) in one serving. Most people should limit total sodium intake to less than 2,300 mg per day.  Always check the nutrition information of foods labeled as "low-fat" or "nonfat." These foods may be higher in added sugar or refined carbs and should be avoided.  Talk to your dietitian to identify your daily goals for nutrients listed on the label. Shopping  Avoid buying canned, pre-made, or processed foods. These foods tend to be high in fat, sodium, and added  sugar.  Shop around the outside edge of the grocery store. This is where you will most often find fresh fruits and vegetables, bulk grains, fresh meats, and fresh dairy. Cooking  Use low-heat cooking methods, such as baking, instead of high-heat cooking methods like deep frying.  Cook using healthy oils, such as olive, canola, or sunflower oil.  Avoid cooking with butter, cream, or high-fat meats. Meal planning  Eat meals and snacks regularly, preferably at the same times every day. Avoid going long periods of time without eating.  Eat foods that are high in fiber, such as fresh fruits, vegetables, beans, and whole grains. Talk with your dietitian about how many servings of carbs you can eat at each meal.  Eat 4-6 oz (112-168 g) of lean protein each day, such as lean meat, chicken, fish, eggs, or tofu. One ounce (oz) of lean protein is equal to: ? 1 oz (28 g) of meat, chicken, or fish. ? 1 egg. ?  cup (62 g) of tofu.  Eat some foods each day that contain healthy fats, such as avocado, nuts, seeds, and fish.   What foods should I eat? Fruits Berries. Apples. Oranges. Peaches. Apricots. Plums. Grapes. Mango. Papaya. Pomegranate. Kiwi. Cherries. Vegetables Lettuce. Spinach. Leafy greens, including kale, chard, collard greens, and mustard greens. Beets. Cauliflower. Cabbage. Broccoli. Carrots. Green beans. Tomatoes. Peppers. Onions. Cucumbers. Brussels sprouts. Grains Whole grains, such as whole-wheat or whole-grain bread, crackers, tortillas, cereal, and pasta. Unsweetened oatmeal. Quinoa. Brown or wild rice. Meats and other proteins Seafood. Poultry without skin. Lean cuts of poultry and beef. Tofu. Nuts. Seeds. Dairy Low-fat or fat-free dairy products such as milk, yogurt, and cheese. The items listed above may not be a complete list of foods and beverages you can eat. Contact a dietitian for more information. What foods should I avoid? Fruits Fruits canned with  syrup. Vegetables Canned vegetables. Frozen vegetables with butter or cream sauce. Grains Refined white flour and flour products such as bread, pasta, snack foods, and cereals. Avoid all processed foods. Meats and other proteins Fatty cuts of meat. Poultry with skin. Breaded or fried meats. Processed meat. Avoid saturated fats. Dairy Full-fat yogurt, cheese, or milk. Beverages Sweetened drinks, such as soda or iced tea. The items listed above may not be a complete list of foods and beverages you should avoid. Contact a dietitian for more information. Questions to ask a health care provider  Do I need to meet with a diabetes educator?  Do I need to meet with a dietitian?  What number can I call if I have questions?  When are the best times to check my blood glucose? Where to find more information:  American Diabetes Association: diabetes.org  Academy of Nutrition and Dietetics: www.eatright.org  National Institute of Diabetes and Digestive and Kidney Diseases: www.niddk.nih.gov  Association of Diabetes Care and Education Specialists: www.diabeteseducator.org Summary  It is important to have healthy eating   habits because your blood sugar (glucose) levels are greatly affected by what you eat and drink.  A healthy meal plan will help you control your blood glucose and maintain a healthy lifestyle.  Your health care provider may recommend that you work with a dietitian to make a meal plan that is best for you.  Keep in mind that carbohydrates (carbs) and alcohol have immediate effects on your blood glucose levels. It is important to count carbs and to use alcohol carefully. This information is not intended to replace advice given to you by your health care provider. Make sure you discuss any questions you have with your health care provider. Document Revised: 02/27/2019 Document Reviewed: 02/27/2019 Elsevier Patient Education  2021 Elsevier Inc.  

## 2020-12-26 NOTE — Telephone Encounter (Signed)
Key: [id number] Mounjaro 5MG /0.5ML pen-injectors for DM  Sent to plan  9/23-jhb

## 2020-12-26 NOTE — Telephone Encounter (Signed)
Per plan - pt must try one of the following:   Bydureon, byetta, rybelsus, or victoza.     (Already tried ozempic and trulicity)

## 2021-01-01 ENCOUNTER — Ambulatory Visit: Payer: 59 | Admitting: Family

## 2021-02-19 ENCOUNTER — Ambulatory Visit: Payer: 59 | Admitting: Family

## 2021-02-20 ENCOUNTER — Encounter: Payer: Self-pay | Admitting: Family

## 2021-03-14 ENCOUNTER — Other Ambulatory Visit: Payer: Self-pay | Admitting: Family

## 2021-03-14 DIAGNOSIS — K219 Gastro-esophageal reflux disease without esophagitis: Secondary | ICD-10-CM

## 2021-04-13 ENCOUNTER — Other Ambulatory Visit: Payer: Self-pay | Admitting: Family

## 2021-04-13 DIAGNOSIS — E1169 Type 2 diabetes mellitus with other specified complication: Secondary | ICD-10-CM

## 2021-04-23 ENCOUNTER — Encounter: Payer: Self-pay | Admitting: Family

## 2021-04-23 ENCOUNTER — Ambulatory Visit (INDEPENDENT_AMBULATORY_CARE_PROVIDER_SITE_OTHER): Payer: 59 | Admitting: Family

## 2021-04-23 VITALS — BP 144/89 | HR 71 | Temp 98.1°F | Ht 71.0 in | Wt 281.0 lb

## 2021-04-23 DIAGNOSIS — E1169 Type 2 diabetes mellitus with other specified complication: Secondary | ICD-10-CM | POA: Diagnosis not present

## 2021-04-23 DIAGNOSIS — F411 Generalized anxiety disorder: Secondary | ICD-10-CM | POA: Diagnosis not present

## 2021-04-23 DIAGNOSIS — E785 Hyperlipidemia, unspecified: Secondary | ICD-10-CM

## 2021-04-23 DIAGNOSIS — I10 Essential (primary) hypertension: Secondary | ICD-10-CM | POA: Diagnosis not present

## 2021-04-23 DIAGNOSIS — B372 Candidiasis of skin and nail: Secondary | ICD-10-CM

## 2021-04-23 DIAGNOSIS — G4700 Insomnia, unspecified: Secondary | ICD-10-CM

## 2021-04-23 DIAGNOSIS — F321 Major depressive disorder, single episode, moderate: Secondary | ICD-10-CM

## 2021-04-23 LAB — LIPID PANEL
Chol/HDL Ratio: 5.6 ratio — ABNORMAL HIGH (ref 0.0–5.0)
Cholesterol, Total: 174 mg/dL (ref 100–199)
HDL: 31 mg/dL — ABNORMAL LOW (ref >39)
LDL Chol Calc (NIH): 104 mg/dL — ABNORMAL HIGH (ref 0–99)
Triglycerides: 227 mg/dL — ABNORMAL HIGH (ref 0–149)
VLDL Cholesterol Cal: 39 mg/dL (ref 5–40)

## 2021-04-23 LAB — CBC WITH DIFFERENTIAL/PLATELET
Basophils Absolute: 0.1 10*3/uL (ref 0.0–0.2)
Basos: 2 %
EOS (ABSOLUTE): 0.5 10*3/uL — ABNORMAL HIGH (ref 0.0–0.4)
Eos: 6 %
Hematocrit: 50.6 % (ref 37.5–51.0)
Hemoglobin: 16.8 g/dL (ref 13.0–17.7)
Immature Grans (Abs): 0 10*3/uL (ref 0.0–0.1)
Immature Granulocytes: 0 %
Lymphocytes Absolute: 1.9 10*3/uL (ref 0.7–3.1)
Lymphs: 22 %
MCH: 29.9 pg (ref 26.6–33.0)
MCHC: 33.2 g/dL (ref 31.5–35.7)
MCV: 90 fL (ref 79–97)
Monocytes Absolute: 0.8 10*3/uL (ref 0.1–0.9)
Monocytes: 9 %
Neutrophils Absolute: 5.4 10*3/uL (ref 1.4–7.0)
Neutrophils: 61 %
Platelets: 318 10*3/uL (ref 150–450)
RBC: 5.62 x10E6/uL (ref 4.14–5.80)
RDW: 13.1 % (ref 11.6–15.4)
WBC: 8.9 10*3/uL (ref 3.4–10.8)

## 2021-04-23 LAB — BAYER DCA HB A1C WAIVED: HB A1C (BAYER DCA - WAIVED): 5.7 % — ABNORMAL HIGH (ref 4.8–5.6)

## 2021-04-23 LAB — CMP14+EGFR
ALT: 25 IU/L (ref 0–44)
AST: 27 IU/L (ref 0–40)
Albumin/Globulin Ratio: 1.6 (ref 1.2–2.2)
Albumin: 4.6 g/dL (ref 4.0–5.0)
Alkaline Phosphatase: 73 IU/L (ref 44–121)
BUN/Creatinine Ratio: 15 (ref 9–20)
BUN: 19 mg/dL (ref 6–24)
Bilirubin Total: 0.5 mg/dL (ref 0.0–1.2)
CO2: 26 mmol/L (ref 20–29)
Calcium: 10.4 mg/dL — ABNORMAL HIGH (ref 8.7–10.2)
Chloride: 101 mmol/L (ref 96–106)
Creatinine, Ser: 1.3 mg/dL — ABNORMAL HIGH (ref 0.76–1.27)
Globulin, Total: 2.9 g/dL (ref 1.5–4.5)
Glucose: 88 mg/dL (ref 70–99)
Potassium: 5.2 mmol/L (ref 3.5–5.2)
Sodium: 139 mmol/L (ref 134–144)
Total Protein: 7.5 g/dL (ref 6.0–8.5)
eGFR: 68 mL/min/{1.73_m2} (ref >59)

## 2021-04-23 MED ORDER — NYSTATIN 100000 UNIT/GM EX POWD: 1.0000 "application " | Freq: Three times a day (TID) | CUTANEOUS | 2 refills | Status: DC

## 2021-04-23 MED ORDER — KETOCONAZOLE 2 % EX CREA: 1.0000 "application " | TOPICAL_CREAM | Freq: Every day | CUTANEOUS | 1 refills | Status: DC

## 2021-04-23 MED ORDER — TIRZEPATIDE 7.5 MG/0.5ML ~~LOC~~ SOAJ: 7.5000 mg | SUBCUTANEOUS | 2 refills | Status: DC

## 2021-04-23 MED ORDER — DESVENLAFAXINE SUCCINATE ER 50 MG PO TB24: 50.0000 mg | ORAL_TABLET | Freq: Every day | ORAL | 1 refills | Status: DC

## 2021-04-23 MED ORDER — TRAZODONE HCL 50 MG PO TABS: 50.0000 mg | ORAL_TABLET | Freq: Every evening | ORAL | 1 refills | Status: DC | PRN

## 2021-04-23 NOTE — Progress Notes (Signed)
Subjective:    Patient ID: Ernest Miller, male    DOB: [birth year], 46 y.o.   MRN: [id number]  Chief Complaint  Patient presents with   Medical Management of Chronic Issues   PT presents to the office today for chronic follow up. He reports he had a tick on his left thoracic area Sunday. He states since he has had fatigue and body aches. Denies any fever or rash.  Hypertension This is a chronic problem. The current episode started more than 1 year ago. The problem has been waxing and waning since onset. The problem is uncontrolled. Associated symptoms include anxiety and malaise/fatigue. Pertinent negatives include no blurred vision, peripheral edema or shortness of breath. Risk factors for coronary artery disease include dyslipidemia, diabetes mellitus, obesity and male gender. The current treatment provides moderate improvement. There is no history of CVA or heart failure.  Hyperlipidemia This is a chronic problem. The current episode started more than 1 year ago. The problem is controlled. Exacerbating diseases include obesity. Pertinent negatives include no shortness of breath. Current antihyperlipidemic treatment includes statins. The current treatment provides moderate improvement of lipids. Risk factors for coronary artery disease include dyslipidemia, diabetes mellitus, male sex, hypertension and a sedentary lifestyle.  Diabetes He presents for his follow-up diabetic visit. He has type 2 diabetes mellitus. Hypoglycemia symptoms include nervousness/anxiousness. Pertinent negatives for diabetes include no blurred vision and no foot paresthesias. Symptoms are stable. Diabetic complications include nephropathy and peripheral neuropathy. Pertinent negatives for diabetic complications include no CVA or heart disease. Risk factors for coronary artery disease include dyslipidemia, diabetes mellitus, male sex, hypertension and sedentary lifestyle. He is following a generally unhealthy diet. His overall  blood glucose range is 130-140 mg/dl. An ACE inhibitor/angiotensin II receptor blocker is being taken. Eye exam is not current.  Anxiety Presents for follow-up visit. Symptoms include depressed mood, excessive worry, insomnia, irritability and nervous/anxious behavior. Patient reports no shortness of breath. Symptoms occur occasionally. The severity of symptoms is moderate.    Gastroesophageal Reflux He complains of belching and heartburn. This is a chronic problem. The current episode started more than 1 year ago. The problem occurs occasionally. The problem has been waxing and waning. He has tried a PPI for the symptoms. The treatment provided moderate relief.  Insomnia Primary symptoms: difficulty falling asleep, frequent awakening, malaise/fatigue.   The current episode started more than one year. The onset quality is gradual. The problem occurs intermittently. Past treatments include medication. The treatment provided moderate relief.     Review of Systems  Constitutional:  Positive for irritability and malaise/fatigue.  Eyes:  Negative for blurred vision.  Respiratory:  Negative for shortness of breath.   Gastrointestinal:  Positive for heartburn.  Psychiatric/Behavioral:  The patient is nervous/anxious and has insomnia.   All other systems reviewed and are negative.     Objective:   Physical Exam Vitals reviewed.  Constitutional:      General: He is not in acute distress.    Appearance: He is well-developed. He is obese.  HENT:     Head: Normocephalic.     Right Ear: Tympanic membrane normal.     Left Ear: Tympanic membrane normal.  Eyes:     General:        Right eye: No discharge.        Left eye: No discharge.     Pupils: Pupils are equal, round, and reactive to light.  Neck:     Thyroid: No thyromegaly.  Cardiovascular:  Rate and Rhythm: Normal rate and regular rhythm.     Heart sounds: Normal heart sounds. No murmur heard. Pulmonary:     Effort: Pulmonary  effort is normal. No respiratory distress.     Breath sounds: Normal breath sounds. No wheezing.  Abdominal:     General: Bowel sounds are normal. There is no distension.     Palpations: Abdomen is soft.     Tenderness: There is no abdominal tenderness.  Musculoskeletal:        General: No tenderness. Normal range of motion.     Cervical back: Normal range of motion and neck supple.  Skin:    General: Skin is warm and dry.     Findings: No erythema or rash.  Neurological:     Mental Status: He is alert and oriented to person, place, and time.     Cranial Nerves: No cranial nerve deficit.     Deep Tendon Reflexes: Reflexes are normal and symmetric.  Psychiatric:        Behavior: Behavior normal.        Thought Content: Thought content normal.        Judgment: Judgment normal.         BP (!) 144/89    Pulse 71    Temp 98.1 F (36.7 C) (Temporal)    Ht _0  (1.803 m)    Wt 281 lb (127.5 kg)    BMI 39.19 kg/m   Assessment & Plan:  Ernest Miller comes in today with chief complaint of Medical Management of Chronic Issues   Diagnosis and orders addressed:  1. Type 2 diabetes mellitus with other specified complication, without long-term current use of insulin (HCC) Will increase Mounjaro 7.5 mg Strict low carb diet  - Bayer DCA Hb A1c Waived - CMP14+EGFR - CBC with Differential/Platelet - tirzepatide (MOUNJARO) 7.5 MG/0.5ML Pen; Inject 7.5 mg into the skin once a week.  Dispense: 6 mL; Refill: 2  2. Hyperlipidemia associated with type 2 diabetes mellitus ([place]) - CMP14+EGFR - CBC with Differential/Platelet - Lipid panel  3. Primary hypertension - CMP14+EGFR - CBC with Differential/Platelet  4. GAD (generalized anxiety disorder) Will add Pristiq 50 mg daily Stress management  - CMP14+EGFR - CBC with Differential/Platelet - desvenlafaxine (PRISTIQ) 50 MG 24 hr tablet; Take 1 tablet (50 mg total) by mouth daily.  Dispense: 90 tablet; Refill: 1  5. Morbid obesity  ([place]) - CMP14+EGFR - CBC with Differential/Platelet  6. Insomnia, unspecified type Start Trazodone 50-100 m g Sleep ritual  - CMP14+EGFR - CBC with Differential/Platelet - traZODone (DESYREL) 50 MG tablet; Take 1-2 tablets (50-100 mg total) by mouth at bedtime as needed for sleep.  Dispense: 180 tablet; Refill: 1  7. Depression, major, single episode, moderate (HCC) - CMP14+EGFR - CBC with Differential/Platelet - desvenlafaxine (PRISTIQ) 50 MG 24 hr tablet; Take 1 tablet (50 mg total) by mouth daily.  Dispense: 90 tablet; Refill: 1  8. Candidal skin infection - nystatin (MYCOSTATIN/NYSTOP) powder; Apply 1 application topically 3 (three) times daily.  Dispense: 120 g; Refill: 2 - ketoconazole (NIZORAL) 2 % cream; Apply 1 application topically daily.  Dispense: 60 g; Refill: 1   Labs pending Health Maintenance reviewed Diet and exercise encouraged  Follow up plan: 3 months    [name], FNP

## 2021-04-23 NOTE — Patient Instructions (Signed)

## 2021-05-29 ENCOUNTER — Encounter: Payer: Self-pay | Admitting: Family

## 2021-05-29 ENCOUNTER — Ambulatory Visit (INDEPENDENT_AMBULATORY_CARE_PROVIDER_SITE_OTHER): Payer: 59 | Admitting: Family

## 2021-05-29 ENCOUNTER — Ambulatory Visit (INDEPENDENT_AMBULATORY_CARE_PROVIDER_SITE_OTHER): Payer: 59

## 2021-05-29 ENCOUNTER — Other Ambulatory Visit: Payer: Self-pay | Admitting: Family

## 2021-05-29 VITALS — BP 139/81 | HR 100 | Temp 99.1°F | Ht 71.0 in | Wt 271.0 lb

## 2021-05-29 DIAGNOSIS — F321 Major depressive disorder, single episode, moderate: Secondary | ICD-10-CM | POA: Diagnosis not present

## 2021-05-29 DIAGNOSIS — S6991XA Unspecified injury of right wrist, hand and finger(s), initial encounter: Secondary | ICD-10-CM | POA: Diagnosis not present

## 2021-05-29 DIAGNOSIS — F411 Generalized anxiety disorder: Secondary | ICD-10-CM

## 2021-05-29 DIAGNOSIS — I10 Essential (primary) hypertension: Secondary | ICD-10-CM | POA: Diagnosis not present

## 2021-05-29 MED ORDER — TRAMADOL HCL 50 MG PO TABS: 50.0000 mg | ORAL_TABLET | Freq: Three times a day (TID) | ORAL | 0 refills | Status: AC | PRN

## 2021-05-29 MED ORDER — DESVENLAFAXINE SUCCINATE ER 100 MG PO TB24: 100.0000 mg | ORAL_TABLET | Freq: Every day | ORAL | 1 refills | Status: DC

## 2021-05-29 NOTE — Patient Instructions (Signed)
Finger Fracture, Adult °A finger fracture is a break in any of the finger bones. °What are the causes? °The main cause of finger fractures is injury, such as from sports, a fall, or closing your finger in a drawer or door. °What increases the risk? °The following factors may make you more likely to develop this condition: °Playing sports. °Workplace activities that involve machinery. °Having weak bones (osteoporosis). This condition makes your bones less dense and causes them to break easily. °What are the signs or symptoms? °The main symptoms of a fractured finger are pain, bruising, and swelling shortly after the injury. Other symptoms include: °Stiffness. °Exposed bones (compound fracture or open fracture), in severe cases. °How is this diagnosed? °This condition is diagnosed based on a physical exam, your medical history, and your symptoms. An X-ray will also be done to confirm the diagnosis. °How is this treated? °Treatment for this condition depends on the severity of the fracture. If the bones are still in place, the finger may be splinted to keep the finger still while it heals (immobilization). If several fingers are fractured, you may need a cast. A cast may be applied up to the elbow to keep your fingers and hand from moving. °If the bones are out of place, your health care provider may move them back into place manually or surgically. °You may also need to do physical therapy exercises to improve movement and strength in your finger. °Follow these instructions at home: °If you have a removable splint: ° °Wear the splint as told by your health care provider. Remove it only as told by your health care provider. °Check the skin around the splint every day. Tell your health care provider about any concerns. °Loosen the splint if your fingers tingle, become numb, or turn cold and blue. °Keep the splint clean and dry. °If you have a nonremovable cast: °Do not put pressure on any part of the cast until it is  fully hardened. This may take several hours. °Do not stick anything inside the cast to scratch your skin. Doing that increases your risk of infection. °Check the skin around the cast every day. Tell your health care provider about any concerns. °You may put lotion on dry skin around the edges of the cast. Do not put lotion on the skin underneath the cast. °Keep the cast clean and dry. °Bathing °Do not take baths, swim, or use a hot tub until your health care provider approves. Ask your health care provider if you may take showers. °If your splint or cast is not waterproof: °Do not let it get wet. °Cover it with a watertight covering when you take a bath or shower. °Managing pain, stiffness, and swelling °If directed, put ice on the injured area. To do this: °If you have a removable splint, remove it as told by your health care provider. °Put ice in a plastic bag. °Place a towel between your skin and the bag, or between your cast and the bag. °Leave the ice on for 20 minutes, 2-3 times a day. °Remove the ice if your skin turns bright red. This is very important. If you cannot feel pain, heat, or cold, you have a greater risk of damage to the area. °Move your fingers often to reduce stiffness and swelling. °Raise (elevate) the injured area above the level of your heart while you are sitting or lying down. °Activity °Ask your health care provider if the medicine prescribed to you requires you to avoid driving or using   machinery. °Do physical therapy exercises as told by your health care provider. °Return to your normal activities as told by your health care provider. Ask your health care provider what activities are safe for you. °Ask your health care provider when it is safe to drive if you have a splint or cast on your finger. °General instructions °Do not use any products that contain nicotine or tobacco. These products include cigarettes, chewing tobacco, and vaping devices, such as e-cigarettes. These can delay  bone healing. If you need help quitting, ask your health care provider. °Take over-the-counter and prescription medicines only as told by your health care provider. °Keep all follow-up visits. This is important. °Contact a health care provider if: °Your pain or swelling gets worse, even with treatment. °You have trouble moving your finger. °Get help right away if: °Your finger becomes numb or blue. °Summary °A finger fracture is a break in any of the finger bones. °Injury is the main cause of finger fractures. °Treatment for this condition depends on the severity of the fracture. °This information is not intended to replace advice given to you by your health care provider. Make sure you discuss any questions you have with your health care provider. °Document Revised: 03/11/2020 Document Reviewed: 01/29/2020 °Elsevier Patient Education © 2022 [employer] ° °
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
## 2021-06-09 ENCOUNTER — Other Ambulatory Visit: Payer: Self-pay | Admitting: Family

## 2021-06-09 ENCOUNTER — Telehealth: Payer: Self-pay | Admitting: Family

## 2021-06-09 DIAGNOSIS — E1169 Type 2 diabetes mellitus with other specified complication: Secondary | ICD-10-CM

## 2021-06-09 MED ORDER — TIRZEPATIDE 5 MG/0.5ML ~~LOC~~ SOAJ: 5.0000 mg | SUBCUTANEOUS | 1 refills | Status: DC

## 2021-06-09 NOTE — Telephone Encounter (Signed)
Patient aware and verbalized understanding. °

## 2021-06-09 NOTE — Telephone Encounter (Signed)
Patient said that he has been out of mounjaro since 2/26, when he took his last shot. [facility] [place] [facility] and [facility] that [facility] locations are [facility] at this time. He would like to drop back down to 5mg  from 7.5mg  if possible as well. Please call back and advise. ?

## 2021-06-09 NOTE — Telephone Encounter (Signed)
Mounjaro 5 mg Prescription sent to pharmacy  ? ?

## 2021-06-12 ENCOUNTER — Other Ambulatory Visit: Payer: Self-pay | Admitting: Family

## 2021-06-12 DIAGNOSIS — K219 Gastro-esophageal reflux disease without esophagitis: Secondary | ICD-10-CM

## 2021-08-27 ENCOUNTER — Encounter: Payer: Self-pay | Admitting: Family

## 2021-08-27 ENCOUNTER — Telehealth (INDEPENDENT_AMBULATORY_CARE_PROVIDER_SITE_OTHER): Payer: 59 | Admitting: Family

## 2021-08-27 DIAGNOSIS — S80862A Insect bite (nonvenomous), left lower leg, initial encounter: Secondary | ICD-10-CM

## 2021-08-27 DIAGNOSIS — W57XXXA Bitten or stung by nonvenomous insect and other nonvenomous arthropods, initial encounter: Secondary | ICD-10-CM | POA: Diagnosis not present

## 2021-08-27 MED ORDER — DOXYCYCLINE HYCLATE 100 MG PO TABS: 100.0000 mg | ORAL_TABLET | Freq: Two times a day (BID) | ORAL | 0 refills | Status: DC

## 2021-08-27 NOTE — Progress Notes (Signed)
Virtual Visit Consent   [name], you are scheduled for a virtual visit with a [facility] provider today. Just as with appointments in the office, your consent must be obtained to participate. Your consent will be active for this visit and any virtual visit you may have with one of our providers in the next 365 days. If you have a MyChart account, a copy of this consent can be sent to you electronically.  As this is a virtual visit, video technology does not allow for your provider to perform a traditional examination. This may limit your provider's ability to fully assess your condition. If your provider identifies any concerns that need to be evaluated in person or the need to arrange testing (such as labs, EKG, etc.), we will make arrangements to do so. Although advances in technology are sophisticated, we cannot ensure that it will always work on either your end or our end. If the connection with a video visit is poor, the visit may have to be switched to a telephone visit. With either a video or telephone visit, we are not always able to ensure that we have a secure connection.  By engaging in this virtual visit, you consent to the provision of healthcare and authorize for your insurance to be billed (if applicable) for the services provided during this visit. Depending on your insurance coverage, you may receive a charge related to this service.  I need to obtain your verbal consent now. Are you willing to proceed with your visit today? [name] has provided verbal consent on 08/27/2021 for a virtual visit (video or telephone). Jannifer Rodney, FNP  Date: 08/27/2021 4:25 PM  Virtual Visit via Video Note   I, Jannifer Rodney, connected with  Ernest Miller  ([id number], 1975/09/04) on 08/27/21 at  4:45 PM EDT by a video-enabled telemedicine application and verified that I am speaking with the correct person using two identifiers.  Location: Patient: Virtual Visit Location Patient: Other:  work Provider: Engineer, mining Provider: Home   I discussed the limitations of evaluation and management by telemedicine and the availability of in person appointments. The patient expressed understanding and agreed to proceed.    History of Present Illness: Ernest Miller is a 46 y.o. who identifies as a male who was assigned male at birth, and is being seen today for multiple  tick bites on groin, left lower leg, left shoulder that he removed two weeks ago. He reports they were attached for a few hours. However, since joint pain, fatigue, flu like symptoms, and headache.   HPI: HPI  Problems:  Patient Active Problem List   Diagnosis Date Noted   Chest pain 08/20/2019   Hypertension 06/05/2019   Kidney stone 12/26/2017   S/P laparoscopic cholecystectomy 11/24/2017   Diabetes mellitus (HCC) 02/16/2017   Hyperlipidemia associated with type 2 diabetes mellitus (HCC) 02/16/2017   Morbid obesity (HCC) 02/16/2017   GAD (generalized anxiety disorder) 05/25/2016   SOB (shortness of breath) 05/25/2016    Allergies:  Allergies  Allergen Reactions   Jardiance [Empagliflozin] Rash   Medications:  Current Outpatient Medications:    doxycycline (VIBRA-TABS) 100 MG tablet, Take 1 tablet (100 mg total) by mouth 2 (two) times daily., Disp: 42 tablet, Rfl: 0   Continuous Blood Gluc Sensor (FREESTYLE LIBRE 14 DAY SENSOR) MISC, NEEDS TO CHECK BLOOD SUGAR 4 TIMES DAILY, Disp: 2 each, Rfl: 6   desvenlafaxine (PRISTIQ) 100 MG 24 hr tablet, Take 1 tablet (100 mg  total) by mouth daily., Disp: 90 tablet, Rfl: 1   ketoconazole (NIZORAL) 2 % cream, Apply 1 application topically daily., Disp: 60 g, Rfl: 1   losartan (COZAAR) 25 MG tablet, Take 1 tablet (25 mg total) by mouth daily., Disp: 90 tablet, Rfl: 3   metFORMIN (GLUCOPHAGE XR) 750 MG 24 hr tablet, Take 2 tablets (1,500 mg total) by mouth daily with breakfast., Disp: 180 tablet, Rfl: 2   nystatin (MYCOSTATIN/NYSTOP) powder, Apply 1 application  topically 3 (three) times daily., Disp: 120 g, Rfl: 2   omeprazole (PRILOSEC) 40 MG capsule, TAKE 1 CAPSULE BY MOUTH EVERY DAY, Disp: 90 capsule, Rfl: 0   rosuvastatin (CRESTOR) 5 MG tablet, Take 1 tablet (5 mg total) by mouth daily., Disp: 90 tablet, Rfl: 3   tirzepatide (MOUNJARO) 5 MG/0.5ML Pen, Inject 5 mg into the skin once a week., Disp: 6 mL, Rfl: 1   traZODone (DESYREL) 50 MG tablet, Take 1-2 tablets (50-100 mg total) by mouth at bedtime as needed for sleep., Disp: 180 tablet, Rfl: 1  Observations/Objective: Patient is well-developed, well-nourished in no acute distress.  Resting comfortably  at home.  Head is normocephalic, atraumatic.  No labored breathing.  Speech is clear and coherent with logical content.  Patient is alert and oriented at baseline.    Assessment and Plan: 1. Tick bite of left lower leg, initial encounter - doxycycline (VIBRA-TABS) 100 MG tablet; Take 1 tablet (100 mg total) by mouth 2 (two) times daily.  Dispense: 42 tablet; Refill: 0  -Pt to report any new fever, joint pain, or rash Will treat given flu like symptoms -Wear protective clothing while outside- Long sleeves and long pants -Put insect repellent on all exposed skin and along clothing -Take a shower as soon as possible after being outside -Follow up if symptoms worsen or do not improve   Follow Up Instructions: I discussed the assessment and treatment plan with the patient. The patient was provided an opportunity to ask questions and all were answered. The patient agreed with the plan and demonstrated an understanding of the instructions.  A copy of instructions were sent to the patient via MyChart unless otherwise noted below.     The patient was advised to call back or seek an in-person evaluation if the symptoms worsen or if the condition fails to improve as anticipated.  Time:  I spent 8 minutes with the patient via telehealth technology discussing the above problems/concerns.     Jannifer Rodney, FNP

## 2021-09-14 ENCOUNTER — Other Ambulatory Visit: Payer: Self-pay | Admitting: Family

## 2021-09-14 DIAGNOSIS — S80862A Insect bite (nonvenomous), left lower leg, initial encounter: Secondary | ICD-10-CM

## 2021-10-20 ENCOUNTER — Ambulatory Visit (INDEPENDENT_AMBULATORY_CARE_PROVIDER_SITE_OTHER): Payer: 59 | Admitting: Family

## 2021-10-20 ENCOUNTER — Ambulatory Visit (INDEPENDENT_AMBULATORY_CARE_PROVIDER_SITE_OTHER): Payer: 59

## 2021-10-20 ENCOUNTER — Encounter: Payer: Self-pay | Admitting: Family

## 2021-10-20 VITALS — BP 126/77 | HR 65 | Temp 97.3°F | Ht 71.0 in | Wt 276.6 lb

## 2021-10-20 DIAGNOSIS — M25561 Pain in right knee: Secondary | ICD-10-CM

## 2021-10-20 DIAGNOSIS — I10 Essential (primary) hypertension: Secondary | ICD-10-CM

## 2021-10-20 DIAGNOSIS — E1169 Type 2 diabetes mellitus with other specified complication: Secondary | ICD-10-CM

## 2021-10-20 DIAGNOSIS — G8929 Other chronic pain: Secondary | ICD-10-CM

## 2021-10-20 DIAGNOSIS — E785 Hyperlipidemia, unspecified: Secondary | ICD-10-CM

## 2021-10-20 LAB — CMP14+EGFR
ALT: 29 IU/L (ref 0–44)
AST: 19 IU/L (ref 0–40)
Albumin/Globulin Ratio: 1.7 (ref 1.2–2.2)
Albumin: 4.5 g/dL (ref 4.1–5.1)
Alkaline Phosphatase: 76 IU/L (ref 44–121)
BUN/Creatinine Ratio: 16 (ref 9–20)
BUN: 20 mg/dL (ref 6–24)
Bilirubin Total: 0.4 mg/dL (ref 0.0–1.2)
CO2: 23 mmol/L (ref 20–29)
Calcium: 10 mg/dL (ref 8.7–10.2)
Chloride: 101 mmol/L (ref 96–106)
Creatinine, Ser: 1.22 mg/dL (ref 0.76–1.27)
Globulin, Total: 2.7 g/dL (ref 1.5–4.5)
Glucose: 97 mg/dL (ref 70–99)
Potassium: 4.7 mmol/L (ref 3.5–5.2)
Sodium: 139 mmol/L (ref 134–144)
Total Protein: 7.2 g/dL (ref 6.0–8.5)
eGFR: 74 mL/min/{1.73_m2} (ref >59)

## 2021-10-20 LAB — BAYER DCA HB A1C WAIVED: HB A1C (BAYER DCA - WAIVED): 5.3 % (ref 4.8–5.6)

## 2021-10-20 MED ORDER — TIRZEPATIDE 7.5 MG/0.5ML ~~LOC~~ SOAJ: 7.5000 mg | SUBCUTANEOUS | 1 refills | Status: DC

## 2021-10-20 MED ORDER — FREESTYLE LIBRE 2 SENSOR MISC: 1.0000 | Freq: Three times a day (TID) | 2 refills | Status: DC

## 2021-10-20 MED ORDER — DICLOFENAC SODIUM 75 MG PO TBEC: 75.0000 mg | DELAYED_RELEASE_TABLET | Freq: Two times a day (BID) | ORAL | 1 refills | Status: DC

## 2021-10-20 NOTE — Patient Instructions (Signed)
Medial Collateral Knee Ligament Sprain  The medial collateral ligament (MCL) is a tough band of tissue in the knee. It connects the top of the shin bone (tibia) to the bottom of the thigh bone (femur). Your MCL prevents your knee from moving too far inward and helps to keep your knee stable. An MCL sprain is a stretch or tear in the MCL. What are the causes? This condition may be caused by: A hard, direct hit to the outside of your knee. This is a common cause. Your knee falling inward when you run, jump, pivot, or change directions quickly, also called cutting. Repeatedly overstretching the MCL. What increases the risk? The following factors make you more likely to develop this condition: Playing contact sports, such as wrestling or football. Participating in sports that involve sudden movements of cutting, twisting, or turning. These movements are common in hockey, skiing, and soccer. Having weak hip and core muscles. What are the signs or symptoms? Symptoms of this condition include: A feeling like a pop or a snap in your knee at the time of injury. Hearing a noise, like a pop or a snap, at the time of injury. Pain on the inside of the knee. Swelling in the knee. Bruising around the knee. Tenderness when pressing the inside of the knee. Feeling unstable when you stand, like your knee will give way. Difficulty walking on uneven surfaces. How is this diagnosed? This condition may be diagnosed based on: Your medical history. A physical exam. Imaging tests, such as an X-ray, ultrasound, or MRI. During your physical exam, your health care provider will check for pain, limited motion, and instability. How is this treated? Treatment for this condition depends on how severe the injury is. Treatment may include: Keeping weight off the knee until swelling and pain improve. Raising (elevating) the knee above the level of your heart. This helps to reduce swelling. Icing the knee. This helps  to reduce swelling. Taking an NSAID, such as ibuprofen. This helps to reduce pain and swelling. Using a knee brace, elastic sleeve, or crutches while the injury heals. Using a knee brace when participating in athletic activities. Doing rehab exercises (physical therapy). Surgery. This may be needed if: Your MCL tore all the way through. Your knee is unstable. Your knee is not getting better with other treatments. Follow these instructions at home: If you have a brace or sleeve: Wear it as told by your health care provider. Remove it only as told by your health care provider. Check the skin around the brace or sleeve every day. Tell your health care provider about any concerns. Loosen the brace or remove the sleeve if your toes tingle, become numb, or turn cold and blue. Keep it clean. If the brace or sleeve is not waterproof: Do not let it get wet. Cover it with a watertight covering when you take a bath or shower. Managing pain, stiffness, and swelling  If directed, put ice on the inside area of your knee. To do this: If you have a removable brace or sleeve, remove it as told by your health care provider. Put ice in a plastic bag. Place a towel between your skin and the bag. Leave the ice on for 20 minutes, 2-3 times a day. Move your foot and toes often to reduce stiffness and swelling. Raise (elevate) the injured area above the level of your heart while you are sitting or lying down. Activity Ask your health care provider when it is safe to drive   if you have a brace or sleeve on your leg. Do exercises as told by your health care provider. Do not use the injured leg to support your body weight until your health care provider says that you can. Use crutches as told by your health care provider. Return to your normal activities as told by your health care provider. Ask your health care provider what activities are safe for you. General instructions Take over-the-counter and  prescription medicines only as told by your health care provider. Do not use any products that contain nicotine or tobacco. These products include cigarettes, chewing tobacco, and vaping devices, such as e-cigarettes. If you need help quitting, ask your health care provider. Keep all follow-up visits. This is important. How is this prevented? Warm up and stretch before being active. Cool down and stretch after being active. Give your body time to rest between periods of activity. Make sure to use shoes and other equipment that fits you. Be safe and responsible while being active. This will help you avoid falls. Do at least 150 minutes of moderate-intensity exercise each week, such as brisk walking or water aerobics. Maintain physical fitness, including: Strength. Flexibility. Heart (cardiovascular) fitness. Endurance. Contact a health care provider if: Your symptoms do not improve. Your symptoms get worse. Summary An MCL sprain is a knee injury that is caused by stretching the MCL too far. The injury can involve a tear in the MCL. Treatment for this condition depends on how severe the injury is. It may include rest, wearing a brace, or surgery. Do not use the injured leg to support your body weight until your health care provider says that you can. Use crutches as told by your health care provider. Contact a health care provider if your symptoms do not get better or they get worse. Keep all follow-up visits. This is important. This information is not intended to replace advice given to you by your health care provider. Make sure you discuss any questions you have with your health care provider. Document Revised: 11/27/2019 Document Reviewed: 11/27/2019 Elsevier Patient Education  2023 Elsevier Inc.  

## 2021-10-20 NOTE — Progress Notes (Signed)
Subjective:    Patient ID: Ernest Miller, male    DOB: 11/22/1975, 46 y.o.   MRN: [id number]  Chief Complaint  Patient presents with   Knee Pain   Pt presents to the office today with right knee pain that started a year ago and has worsen.   He is also taking Mounjaro 5 mg. Reports he is doing well. He has lost approx 50 lbs over the last year. However, he has gained several pounds back over the last few months.  Knee Pain  The incident occurred more than 1 week ago. There was no injury mechanism. The pain is present in the right knee. The quality of the pain is described as aching. The pain is at a severity of 10/10 (when it is hurting). The pain is moderate. Pertinent negatives include no muscle weakness or numbness. He reports no foreign bodies present. The symptoms are aggravated by movement (walking up steps). He has tried acetaminophen and NSAIDs for the symptoms. The treatment provided mild relief.  Diabetes He presents for his follow-up diabetic visit. He has type 2 diabetes mellitus. Pertinent negatives for diabetes include no blurred vision and no foot paresthesias. Symptoms are stable. Risk factors for coronary artery disease include dyslipidemia, diabetes mellitus, male sex, hypertension and sedentary lifestyle. (Does not check glucose at home)  Hypertension This is a chronic problem. The current episode started more than 1 year ago. The problem has been resolved since onset. The problem is controlled. Associated symptoms include malaise/fatigue. Pertinent negatives include no blurred vision, peripheral edema or shortness of breath. Risk factors for coronary artery disease include dyslipidemia, diabetes mellitus, obesity and male gender. The current treatment provides moderate improvement.  Hyperlipidemia This is a chronic problem. The current episode started more than 1 year ago. Exacerbating diseases include obesity. Pertinent negatives include no shortness of breath. Current  antihyperlipidemic treatment includes statins. The current treatment provides moderate improvement of lipids.      Review of Systems  Constitutional:  Positive for malaise/fatigue.  Eyes:  Negative for blurred vision.  Respiratory:  Negative for shortness of breath.   Neurological:  Negative for numbness.  All other systems reviewed and are negative.      Objective:   Physical Exam Vitals reviewed.  Constitutional:      General: He is not in acute distress.    Appearance: He is well-developed. He is obese.  HENT:     Head: Normocephalic.     Right Ear: Tympanic membrane normal.     Left Ear: Tympanic membrane normal.  Eyes:     General:        Right eye: No discharge.        Left eye: No discharge.     Pupils: Pupils are equal, round, and reactive to light.  Neck:     Thyroid: No thyromegaly.  Cardiovascular:     Rate and Rhythm: Normal rate and regular rhythm.     Heart sounds: Normal heart sounds. No murmur heard. Pulmonary:     Effort: Pulmonary effort is normal. No respiratory distress.     Breath sounds: Normal breath sounds. No wheezing.  Abdominal:     General: Bowel sounds are normal. There is no distension.     Palpations: Abdomen is soft.     Tenderness: There is no abdominal tenderness.  Musculoskeletal:        General: No tenderness. Normal range of motion.     Cervical back: Normal range of motion and neck  supple.     Comments: Full ROM of knee, mild medial pain with flexion, no swelling noted  Skin:    General: Skin is warm and dry.     Findings: No erythema or rash.  Neurological:     Mental Status: He is alert and oriented to person, place, and time.     Cranial Nerves: No cranial nerve deficit.     Deep Tendon Reflexes: Reflexes are normal and symmetric.  Psychiatric:        Behavior: Behavior normal.        Thought Content: Thought content normal.        Judgment: Judgment normal.        BP 126/77   Pulse 65   Temp (!) 97.3 F (36.3 C)    Ht $R'5\' 11"'yj$  (1.803 m)   Wt 276 lb 9.6 oz (125.5 kg)   SpO2 98%   BMI 38.58 kg/m   Assessment & Plan:  KODAH MARET comes in today with chief complaint of Knee Pain   Diagnosis and orders addressed:  1. Chronic pain of right knee Start diclofenac BID  No other NSAID's  Referral to PT If no improvement may need MRI - DG Knee 1-2 Views Right - CMP14+EGFR - diclofenac (VOLTAREN) 75 MG EC tablet; Take 1 tablet (75 mg total) by mouth 2 (two) times daily.  Dispense: 60 tablet; Refill: 1 - Ambulatory referral to Physical Therapy  2. Type 2 diabetes mellitus with other specified complication, without long-term current use of insulin (HCC) Will increase Moujaro to 7.5 mg - Continuous Blood Gluc Sensor (FREESTYLE LIBRE 2 SENSOR) MISC; 1 Device by Does not apply route in the morning, at noon, and at bedtime.  Dispense: 1 each; Refill: 2 - tirzepatide (MOUNJARO) 7.5 MG/0.5ML Pen; Inject 7.5 mg into the skin once a week.  Dispense: 6 mL; Refill: 1 - Bayer DCA Hb A1c Waived - CMP14+EGFR  3. Primary hypertension - CMP14+EGFR  4. Hyperlipidemia associated with type 2 diabetes mellitus ([place]) - CMP14+EGFR  Labs pending Health Maintenance reviewed Diet and exercise encouraged  Follow up plan: 3 months    [name], FNP

## 2021-10-21 ENCOUNTER — Telehealth: Payer: Self-pay | Admitting: Family

## 2021-10-21 NOTE — Telephone Encounter (Signed)
Patient said that when he went to pick up tirzepatide [facility]) 7.5 MG/0.5ML Pen they told him that he would need a prior auth. His insurance will cover Trulicity but he has had a bad reaction to it in the past. Please call back and advise.

## 2021-10-21 NOTE — Telephone Encounter (Signed)
Patient also stated that he is completely out of medicine after taking his last shot today.

## 2021-10-22 NOTE — Telephone Encounter (Signed)
Pt is allergic to Ozempic & Trulicity. Please send RX Mounjaro into [facility].

## 2021-10-22 NOTE — Telephone Encounter (Signed)
Please complete PA for patient. He is a diabetic and has had a reaction to Trulicity in past.

## 2021-10-23 ENCOUNTER — Telehealth: Payer: Self-pay | Admitting: Family

## 2021-10-26 NOTE — Telephone Encounter (Signed)
Patient calling about PA, was told to give [place] phone number ([phone]) which he said is a direct line to talk to someone about the PA for mounjaro.

## 2021-10-26 NOTE — Telephone Encounter (Signed)
Pt calling about this message again. Pt is completely out since last week.

## 2021-10-26 NOTE — Telephone Encounter (Signed)
Pt has [employer].  Your PA has been faxed to the plan as a paper copy. Please contact the plan directly if you haven't received a determination in a typical timeframe.  You will be notified of the determination via fax.   [id number]

## 2021-10-27 NOTE — Telephone Encounter (Signed)
Pt wants to talk to someone about this message. He is upset. Please call back

## 2021-10-27 NOTE — Telephone Encounter (Signed)
Could someone call [phone] and have PA completed? It was submitted via cover my meds yesterday so questions should be answered there and you can pull up It should be a continuation of therapy--not sure why initial was chosen

## 2021-10-27 NOTE — Telephone Encounter (Signed)
Spoke with patient and advised that we do not have samples of Mounjaro 7.5 mg available.  Advised that PA has been sent to plan and we are waiting on response.  Contacted [phone] number provided and spoke with rep who told me this is being reviewed and they will advise within 24 hours.

## 2021-10-27 NOTE — Telephone Encounter (Signed)
I would not use the samples--lets work on completing the PA instead  He would have to take 3 shots on same day to equal his dose and our samples are slim I'll try to take a look at [facility]

## 2021-10-27 NOTE — Telephone Encounter (Signed)
PA has been initiated, waiting on response.  Pt is a diabetic. States that he needs  7.5mg  of Mounjaro.  Raynelle Fanning,  We have 2.5mg  in the fridge. Pt is needing a sample. Please let me know the dosing instructions if we are able to provide. He is due for an injection this Sunday.

## 2021-10-27 NOTE — Telephone Encounter (Signed)
Pt called stating that he has been waiting for someone to call him with some kind of update and no one has contacted him about this. Says he has been waiting a while to get his medication and really needs his medicine. Pt says if he can just get a few samples to last him the rest of the week until we can get PA figured out, that would really help. Pt has been without his medicine for 5 days. Pt is very upset.  Call patient ASAP! [phone]

## 2021-10-29 ENCOUNTER — Telehealth: Payer: Self-pay | Admitting: Family

## 2021-10-29 ENCOUNTER — Other Ambulatory Visit: Payer: Self-pay | Admitting: Family

## 2021-10-29 DIAGNOSIS — E1169 Type 2 diabetes mellitus with other specified complication: Secondary | ICD-10-CM

## 2021-10-29 NOTE — Telephone Encounter (Signed)
PA was received and medication Ernest Miller was denied.  Please call and let him know what he needs to do or can it be appealed.

## 2021-10-29 NOTE — Telephone Encounter (Signed)
Received call from [facility] stating that new PA needs to be sent to them. They will fax over appropriate paperwork, mark fax as urgent when faxing back to them. The PA that has already been completed was denied because there was not enough clinical documentation to support approval.  Forms completed and faxed along with OV notes and recent telephone conversation where Neysa Bonito documented that pt was allergic to Trulicity. Forms faxed at 11:00am at [phone]

## 2021-10-29 NOTE — Telephone Encounter (Signed)
Pt called to let provider know that he finally got in touch with someone from his insurance that told him that the reason they keep denying his medicine his because he has to try Victoza.  Pt needs provider to send Rx for Victoza asap for approval.  Also says he needs Rx sent for a glucose monitor because insurance denied his [name].

## 2021-10-29 NOTE — Telephone Encounter (Signed)
TRULICITY 0.75 MG/0.5ML SOPN        Changed from: tirzepatide (MOUNJARO) 7.5 MG/0.5ML Pen   Pharmacy comment: Alternative Requested:PA NEEDED FOR INSURANCE.

## 2021-10-29 NOTE — Telephone Encounter (Signed)
Pt made aware that I have spoken with the insurance company ( [facility]) The forms have been completed and faxed back to them as requested. Pt states that he will call them around 3pm today to see if the Greggory Keen has been approved.  States that he has used the 5mg  in the past. He would like the 2.5mg  #2 injections until he receives approval for the 7mg . Advised that the pharmacist did not want pt taking multiple injections at a time. Pt agreed to wait until we hear back from insurance.

## 2021-10-29 NOTE — Telephone Encounter (Signed)
Routed message to [facility] to review for Victoza Rx and glucometer

## 2021-10-29 NOTE — Addendum Note (Signed)
Addended by: Dorene Sorrow on: 10/29/2021 03:27 PM   Modules accepted: Orders

## 2021-10-29 NOTE — Telephone Encounter (Signed)
Pt wants to know if PA has been approved? Please call back

## 2021-10-30 MED ORDER — BLOOD GLUCOSE MONITOR KIT: PACK | 0 refills | Status: AC

## 2021-10-30 MED ORDER — VICTOZA 18 MG/3ML ~~LOC~~ SOPN: 1.8000 mg | PEN_INJECTOR | Freq: Every day | SUBCUTANEOUS | 3 refills | Status: DC

## 2021-10-30 NOTE — Addendum Note (Signed)
Addended by: Jannifer Rodney A on: 10/30/2021 08:24 AM   Modules accepted: Orders

## 2021-10-30 NOTE — Telephone Encounter (Signed)
Patient aware and verbalized understanding. °

## 2021-10-30 NOTE — Telephone Encounter (Signed)
Must try Victoza before insurance will cover Mounjaro. Prescription sent to pharmacy.

## 2021-11-02 ENCOUNTER — Telehealth: Payer: Self-pay | Admitting: Family Medicine

## 2021-11-02 NOTE — Telephone Encounter (Signed)
Sent to plan today  

## 2021-11-02 NOTE — Telephone Encounter (Signed)
Your information has been submitted to [employer]. To check for an updated outcome later, reopen this PA request from your dashboard. If [employer] has not responded to your request within 24 hours, contact [employer] at [phone]. If you think there may be a problem with your PA request, use our live chat feature at the bottom right.    

## 2021-11-03 NOTE — Telephone Encounter (Signed)
[facility] - PA Case ID: [id number] Need help? Call us at [phone] Outcome Approvedtoday Your PA request has been approved. Additional information will be provided in the approval communication. (Message 1145) Drug Victoza 18MG pen-injectors Form Ronny Bacon PA Form [phone] NCPDP)  Pharmacy aware

## 2021-11-05 ENCOUNTER — Other Ambulatory Visit: Payer: Self-pay | Admitting: Family

## 2021-11-05 DIAGNOSIS — K219 Gastro-esophageal reflux disease without esophagitis: Secondary | ICD-10-CM

## 2021-11-09 ENCOUNTER — Telehealth: Payer: Self-pay | Admitting: *Deleted

## 2021-11-09 MED ORDER — INSULIN PEN NEEDLE 32G X 4 MM MISC: 3 refills | Status: AC

## 2021-11-09 NOTE — Telephone Encounter (Signed)
[facility] for Victoza Rx sent

## 2021-11-11 ENCOUNTER — Encounter: Payer: Self-pay | Admitting: Family Medicine

## 2021-11-11 ENCOUNTER — Encounter (HOSPITAL_COMMUNITY): Payer: Self-pay | Admitting: *Deleted

## 2021-11-11 ENCOUNTER — Emergency Department (HOSPITAL_COMMUNITY)
Admission: EM | Admit: 2021-11-11 | Discharge: 2021-11-11 | Disposition: A | Discharge status: Home / Self Care | Payer: 59 | Attending: Emergency Medicine | Admitting: Emergency Medicine

## 2021-11-11 ENCOUNTER — Ambulatory Visit (INDEPENDENT_AMBULATORY_CARE_PROVIDER_SITE_OTHER): Payer: 59 | Admitting: Family Medicine

## 2021-11-11 ENCOUNTER — Other Ambulatory Visit: Payer: Self-pay

## 2021-11-11 ENCOUNTER — Ambulatory Visit: Payer: 59

## 2021-11-11 VITALS — BP 208/108 | HR 72 | Temp 97.0°F | Ht 71.0 in | Wt 280.0 lb

## 2021-11-11 DIAGNOSIS — R112 Nausea with vomiting, unspecified: Secondary | ICD-10-CM | POA: Diagnosis not present

## 2021-11-11 DIAGNOSIS — E119 Type 2 diabetes mellitus without complications: Secondary | ICD-10-CM | POA: Diagnosis not present

## 2021-11-11 DIAGNOSIS — R1084 Generalized abdominal pain: Secondary | ICD-10-CM

## 2021-11-11 DIAGNOSIS — R9431 Abnormal electrocardiogram [ECG] [EKG]: Secondary | ICD-10-CM | POA: Diagnosis not present

## 2021-11-11 DIAGNOSIS — T383X5A Adverse effect of insulin and oral hypoglycemic [antidiabetic] drugs, initial encounter: Secondary | ICD-10-CM | POA: Insufficient documentation

## 2021-11-11 DIAGNOSIS — Z7984 Long term (current) use of oral hypoglycemic drugs: Secondary | ICD-10-CM | POA: Diagnosis not present

## 2021-11-11 DIAGNOSIS — T887XXA Unspecified adverse effect of drug or medicament, initial encounter: Secondary | ICD-10-CM | POA: Diagnosis not present

## 2021-11-11 DIAGNOSIS — I161 Hypertensive emergency: Secondary | ICD-10-CM

## 2021-11-11 DIAGNOSIS — Z794 Long term (current) use of insulin: Secondary | ICD-10-CM | POA: Diagnosis not present

## 2021-11-11 DIAGNOSIS — K920 Hematemesis: Secondary | ICD-10-CM | POA: Diagnosis not present

## 2021-11-11 LAB — CBC
HCT: 49.1 % (ref 39.0–52.0)
Hemoglobin: 16.7 g/dL (ref 13.0–17.0)
MCH: 30.5 pg (ref 26.0–34.0)
MCHC: 34 g/dL (ref 30.0–36.0)
MCV: 89.6 fL (ref 80.0–100.0)
Platelets: 266 10*3/uL (ref 150–400)
RBC: 5.48 MIL/uL (ref 4.22–5.81)
RDW: 12.4 % (ref 11.5–15.5)
WBC: 9.5 10*3/uL (ref 4.0–10.5)
nRBC: 0 % (ref 0.0–0.2)

## 2021-11-11 LAB — COMPREHENSIVE METABOLIC PANEL
ALT: 29 U/L (ref 0–44)
AST: 24 U/L (ref 15–41)
Albumin: 4.2 g/dL (ref 3.5–5.0)
Alkaline Phosphatase: 67 U/L (ref 38–126)
Anion gap: 7 (ref 5–15)
BUN: 23 mg/dL — ABNORMAL HIGH (ref 6–20)
CO2: 23 mmol/L (ref 22–32)
Calcium: 9.3 mg/dL (ref 8.9–10.3)
Chloride: 105 mmol/L (ref 98–111)
Creatinine, Ser: 1.06 mg/dL (ref 0.61–1.24)
GFR, Estimated: >60 mL/min (ref >60)
Glucose, Bld: 128 mg/dL — ABNORMAL HIGH (ref 70–99)
Potassium: 4 mmol/L (ref 3.5–5.1)
Sodium: 135 mmol/L (ref 135–145)
Total Bilirubin: 0.6 mg/dL (ref 0.3–1.2)
Total Protein: 8 g/dL (ref 6.5–8.1)

## 2021-11-11 LAB — LIPASE, BLOOD: Lipase: 32 U/L (ref 11–51)

## 2021-11-11 MED ORDER — ALUM & MAG HYDROXIDE-SIMETH 200-200-20 MG/5ML PO SUSP
30.0000 mL | Freq: Once | ORAL | Status: AC
  Administered 2021-11-11: 30 mL via ORAL
  Filled 2021-11-11: qty 30

## 2021-11-11 MED ORDER — LIDOCAINE VISCOUS HCL 2 % MT SOLN
15.0000 mL | Freq: Once | OROMUCOSAL | Status: AC
  Administered 2021-11-11: 15 mL via ORAL
  Filled 2021-11-11: qty 15

## 2021-11-11 MED ORDER — FAMOTIDINE IN NACL 20-0.9 MG/50ML-% IV SOLN
20.0000 mg | Freq: Once | INTRAVENOUS | Status: AC
  Administered 2021-11-11: 20 mg via INTRAVENOUS
  Filled 2021-11-11: qty 50

## 2021-11-11 NOTE — Discharge Instructions (Addendum)
You can buy over-the-counter medicine for reflux, particularly medicines that could include famotidine or Pepcid, and Maalox or calcium.  This will help settle your stomach over the next 2 to 3 days.  Stick to a bland diet, mostly clear liquids and water, saltines, nothing spicy or greasy.  Please reach out to your primary care provider's office as soon as possible.  They will need to see you again and likely will be restarting you on-year-old diabetes medication.  In the meantime you should stop taking the Victoza as you are likely experiencing side effects of this medicine.

## 2021-11-11 NOTE — Progress Notes (Signed)
Subjective:  Patient ID: Ernest Miller, male    DOB: 1975/11/13, 46 y.o.   MRN: [id number]  Patient Care Team: Sharion Balloon, FNP as PCP - General (Family Medicine) Harl Bowie Alphonse Guild, MD as PCP - Cardiology (Cardiology) Celestia Khat, OD (Optometry)   Chief Complaint:  Abdominal Pain (Pain scale 1-10 (10) Patient states he is throwing up blood sever HA )   HPI: Ernest Miller is a 46 y.o. male presenting on 11/11/2021 for Abdominal Pain (Pain scale 1-10 (10) Patient states he is throwing up blood sever HA )   Pt came in to office today with complaints of significant abdominal pain, nausea, vomiting blood, and headache. He was noted to be uncomfortable and actively vomiting. Blood pressure significantly elevated. He reports he feels this is all caused by Victoza which he recently started.   Abdominal Pain This is a new problem. The onset quality is sudden. The pain is located in the generalized abdominal region. The pain is at a severity of 10/10. The pain is severe. The quality of the pain is aching, burning, cramping and sharp. Associated symptoms include anorexia, headaches, nausea and vomiting. Pertinent negatives include no constipation, diarrhea or fever. He has tried nothing for the symptoms.    Relevant past medical, surgical, family, and social history reviewed and updated as indicated.  Allergies and medications reviewed and updated. Data reviewed: Chart in Epic.   Past Medical History:  Diagnosis Date   Anxiety    Complication of anesthesia    had hard time waking patient up in 2000   Dyspnea    increased exertion    History of kidney stones    Kidney stone 12/26/2017    Past Surgical History:  Procedure Laterality Date   CHOLECYSTECTOMY  11/24/2017   LAPROSCOPIC   CHOLECYSTECTOMY N/A 11/24/2017   Procedure: LAPAROSCOPIC CHOLECYSTECTOMY;  Surgeon: Coralie Keens, MD;  Location: [place];  Service: General;  Laterality: N/A;   ELBOW SURGERY Right     EXTRACORPOREAL SHOCK WAVE LITHOTRIPSY Left 10/27/2017   Procedure: LEFT EXTRACORPOREAL SHOCK WAVE LITHOTRIPSY (ESWL);  Surgeon: Cleon Gustin, MD;  Location: [facility];  Service: Urology;  Laterality: Left;   FINGER SURGERY Right    middle   TONSILLECTOMY     WISDOM TOOTH EXTRACTION      Social History   Socioeconomic History   Marital status: Significant Other    Spouse name: Not on file   Number of children: Not on file   Years of education: Not on file   Highest education level: Not on file  Occupational History   Not on file  Tobacco Use   Smoking status: Never   Smokeless tobacco: Never  Vaping Use   Vaping Use: Never used  Substance and Sexual Activity   Alcohol use: No   Drug use: Yes    Types: Marijuana   Sexual activity: Yes  Other Topics Concern   Not on file  Social History Narrative   Not on file   Social Determinants of Health   Financial Resource Strain: Not on file  Food Insecurity: Not on file  Transportation Needs: Not on file  Physical Activity: Not on file  Stress: Not on file  Social Connections: Not on file  Intimate Partner Violence: Not on file    Outpatient Encounter Medications as of 11/11/2021  Medication Sig   blood glucose meter kit and supplies KIT Dispense based on patient and insurance preference. Use up to four times  daily as directed.   diclofenac (VOLTAREN) 75 MG EC tablet Take 1 tablet (75 mg total) by mouth 2 (two) times daily.   Insulin Pen Needle 32G X 4 MM MISC Use with insulin daily   ketoconazole (NIZORAL) 2 % cream Apply 1 application topically daily.   liraglutide (VICTOZA) 18 MG/3ML SOPN Inject 1.8 mg into the skin daily.   losartan (COZAAR) 25 MG tablet Take 1 tablet (25 mg total) by mouth daily.   nystatin (MYCOSTATIN/NYSTOP) powder Apply 1 application topically 3 (three) times daily.   omeprazole (PRILOSEC) 40 MG capsule TAKE 1 CAPSULE BY MOUTH EVERY DAY   rosuvastatin (CRESTOR) 5 MG tablet Take 1 tablet (5 mg total)  by mouth daily.   traZODone (DESYREL) 50 MG tablet Take 1-2 tablets (50-100 mg total) by mouth at bedtime as needed for sleep.   desvenlafaxine (PRISTIQ) 100 MG 24 hr tablet Take 1 tablet (100 mg total) by mouth daily. (Patient not taking: Reported on 11/11/2021)   metFORMIN (GLUCOPHAGE XR) 750 MG 24 hr tablet Take 2 tablets (1,500 mg total) by mouth daily with breakfast.   No facility-administered encounter medications on file as of 11/11/2021.    Allergies  Allergen Reactions   Jardiance [Empagliflozin] Rash    Review of Systems  Constitutional:  Positive for activity change and appetite change. Negative for chills, diaphoresis, fatigue, fever and unexpected weight change.  Eyes:  Negative for photophobia and visual disturbance.  Respiratory:  Negative for cough and shortness of breath.   Cardiovascular:  Negative for chest pain, palpitations and leg swelling.  Gastrointestinal:  Positive for abdominal pain, anorexia, nausea and vomiting. Negative for abdominal distention, anal bleeding, blood in stool, constipation, diarrhea and rectal pain.  Genitourinary:  Negative for decreased urine volume and difficulty urinating.  Neurological:  Positive for headaches. Negative for dizziness, weakness and light-headedness.  Psychiatric/Behavioral:  Negative for confusion.   All other systems reviewed and are negative.       Objective:  BP (!) 208/108   Pulse 72   Temp (!) 97 F (36.1 C) (Temporal)   Ht $R'5\' 11"'MS$  (1.803 m)   Wt 280 lb (127 kg)   BMI 39.05 kg/m    Wt Readings from Last 3 Encounters:  11/11/21 280 lb (127 kg)  10/20/21 276 lb 9.6 oz (125.5 kg)  05/29/21 271 lb (122.9 kg)    Physical Exam Vitals and nursing note reviewed.  Constitutional:      General: He is in acute distress.     Appearance: He is obese. He is ill-appearing and diaphoretic.  HENT:     Head: Normocephalic and atraumatic.     Mouth/Throat:     Mouth: Mucous membranes are moist.  Eyes:     Pupils:  Pupils are equal, round, and reactive to light.  Cardiovascular:     Rate and Rhythm: Normal rate and regular rhythm.     Heart sounds: Normal heart sounds.  Pulmonary:     Effort: Pulmonary effort is normal.     Breath sounds: Normal breath sounds.  Abdominal:     General: There is distension.     Tenderness: There is generalized abdominal tenderness. There is guarding.  Skin:    General: Skin is warm.     Capillary Refill: Capillary refill takes less than 2 seconds.  Neurological:     General: No focal deficit present.     Mental Status: He is alert.  Psychiatric:        Mood and Affect:  Mood normal.        Behavior: Behavior normal.        Thought Content: Thought content normal.        Judgment: Judgment normal.     Results for orders placed or performed in visit on 10/20/21  Bayer DCA Hb A1c Waived  Result Value Ref Range   HB A1C (BAYER DCA - WAIVED) 5.3 4.8 - 5.6 %  CMP14+EGFR  Result Value Ref Range   Glucose 97 70 - 99 mg/dL   BUN 20 6 - 24 mg/dL   Creatinine, Ser 1.22 0.76 - 1.27 mg/dL   eGFR 74 >59 mL/min/1.73   BUN/Creatinine Ratio 16 9 - 20   Sodium 139 134 - 144 mmol/L   Potassium 4.7 3.5 - 5.2 mmol/L   Chloride 101 96 - 106 mmol/L   CO2 23 20 - 29 mmol/L   Calcium 10.0 8.7 - 10.2 mg/dL   Total Protein 7.2 6.0 - 8.5 g/dL   Albumin 4.5 4.1 - 5.1 g/dL   Globulin, Total 2.7 1.5 - 4.5 g/dL   Albumin/Globulin Ratio 1.7 1.2 - 2.2   Bilirubin Total 0.4 0.0 - 1.2 mg/dL   Alkaline Phosphatase 76 44 - 121 IU/L   AST 19 0 - 40 IU/L   ALT 29 0 - 44 IU/L       Pertinent labs & imaging results that were available during my care of the patient were reviewed by me and considered in my medical decision making.  Assessment & Plan:  [place] was seen today for abdominal pain.  Diagnoses and all orders for this visit:  Generalized abdominal pain Hematemesis with nausea Hypertensive emergency Pt noted to be in significant distress in office. Due to severity of pain  along with vomiting, hypertension, and headache, pt sent to the ED for evaluation and treatment.    Continue all other maintenance medications.  Follow up plan: To ED for evaluation   The above assessment and management plan was discussed with the patient. The patient verbalized understanding of and has agreed to the management plan. Patient is aware to call the clinic if they develop any new symptoms or if symptoms persist or worsen. Patient is aware when to return to the clinic for a follow-up visit. Patient educated on when it is appropriate to go to the emergency department.   Monia Pouch, FNP-C [facility] [phone]

## 2021-11-11 NOTE — ED Triage Notes (Signed)
Pt states his insurance company stopped paying for his mounjaro and his PCP placed him on Victoza; pt states last night after his second dose he started having severe headache with burning to stomach; pt has taken medications for stomach pain with no relief  Pt states he has vomiting this am and it was mixed with some blood

## 2021-11-11 NOTE — Patient Instructions (Signed)
ED for treatment

## 2021-11-11 NOTE — ED Provider Notes (Signed)
[facility] EMERGENCY DEPARTMENT Provider Note   CSN: [id number] Arrival date & time: 11/11/21  1448     History  CC: Medication side effect   Ernest Miller is a 46 y.o. male with history of type 2 diabetes presenting to ED with complaint of headache, nausea vomiting.  The patient reports that he was recently switched from his chronic Mounjaro medication for diabetes, which he has been on for a long time, to Victoza, due to issues with insurance not covering the prior.  He reports he got his first dose of Victoza Monday and developed a headache and nausea afterwards.  He took another dose yesterday, decided worsening headache, nausea, not persistent vomiting all day.  He says his stomach feels like it is on fire.  He had similar reaction to Trulicity in the past.   HPI     Home Medications Prior to Admission medications   Medication Sig Start Date End Date Taking? Authorizing Provider  blood glucose meter kit and supplies KIT Dispense based on patient and insurance preference. Use up to four times daily as directed. 10/30/21   [name] A, FNP  desvenlafaxine (PRISTIQ) 100 MG 24 hr tablet Take 1 tablet (100 mg total) by mouth daily. Patient not taking: Reported on 11/11/2021 05/29/21   Sharion Balloon, FNP  diclofenac (VOLTAREN) 75 MG EC tablet Take 1 tablet (75 mg total) by mouth 2 (two) times daily. 10/20/21   Sharion Balloon, FNP  Insulin Pen Needle 32G X 4 MM MISC Use with insulin daily 11/09/21   [name] A, FNP  ketoconazole (NIZORAL) 2 % cream Apply 1 application topically daily. 04/23/21   Sharion Balloon, FNP  losartan (COZAAR) 25 MG tablet Take 1 tablet (25 mg total) by mouth daily. 09/24/20   Arnoldo Lenis, MD  metFORMIN (GLUCOPHAGE XR) 750 MG 24 hr tablet Take 2 tablets (1,500 mg total) by mouth daily with breakfast. 01/31/20 01/30/21  [name] A, FNP  nystatin (MYCOSTATIN/NYSTOP) powder Apply 1 application topically 3 (three) times daily. 04/23/21   Sharion Balloon, FNP  omeprazole (PRILOSEC) 40 MG capsule TAKE 1 CAPSULE BY MOUTH EVERY DAY 11/05/21   [name] A, FNP  rosuvastatin (CRESTOR) 5 MG tablet Take 1 tablet (5 mg total) by mouth daily. 10/01/20   Sharion Balloon, FNP  traZODone (DESYREL) 50 MG tablet Take 1-2 tablets (50-100 mg total) by mouth at bedtime as needed for sleep. 04/23/21   Sharion Balloon, FNP      Allergies    Jardiance [empagliflozin]    Review of Systems   Review of Systems  Physical Exam Updated Vital Signs BP (!) 154/85   Pulse 65   Temp 97.7 F (36.5 C) (Oral)   Resp 17   Ht _0  (1.803 m)   Wt 127 kg   SpO2 100%   BMI 39.05 kg/m  Physical Exam Constitutional:      General: He is not in acute distress. HENT:     Head: Normocephalic and atraumatic.  Eyes:     Conjunctiva/sclera: Conjunctivae normal.     Pupils: Pupils are equal, round, and reactive to light.  Cardiovascular:     Rate and Rhythm: Normal rate and regular rhythm.  Pulmonary:     Effort: Pulmonary effort is normal. No respiratory distress.  Abdominal:     General: There is no distension.     Tenderness: There is no abdominal tenderness.  Skin:    General: Skin is  warm and dry.  Neurological:     General: No focal deficit present.     Mental Status: He is alert. Mental status is at baseline.  Psychiatric:        Mood and Affect: Mood normal.        Behavior: Behavior normal.     ED Results / Procedures / Treatments   Labs (all labs ordered are listed, but only abnormal results are displayed) Labs Reviewed  COMPREHENSIVE METABOLIC PANEL - Abnormal; Notable for the following components:      Result Value   Glucose, Bld 128 (*)    BUN 23 (*)    All other components within normal limits  CBC  LIPASE, BLOOD    EKG EKG Interpretation  Date/Time:  Wednesday November 11 2021 09:36:25 EDT Ventricular Rate:  74 PR Interval:  141 QRS Duration: 71 QT Interval:  364 QTC Calculation: 404 R Axis:   63 Text  Interpretation: Sinus rhythm Confirmed by Octaviano Glow [phone] on 11/11/2021 9:49:05 AM  Radiology No results found.  Procedures Procedures    Medications Ordered in ED Medications  alum & mag hydroxide-simeth (MAALOX/MYLANTA) 200-200-20 MG/5ML suspension 30 mL (30 mLs Oral Given 11/11/21 0952)    And  lidocaine (XYLOCAINE) 2 % viscous mouth solution 15 mL (15 mLs Oral Given 11/11/21 0952)  famotidine (PEPCID) IVPB 20 mg premix (0 mg Intravenous Stopped 11/11/21 1112)    ED Course/ Medical Decision Making/ A&P Clinical Course as of 11/11/21 1136  Wed Nov 11, 2021  1038 Labs are unremarkable [MT]  1131 Patient feels significantly better after GI cocktail.  Suspect again that he likely had gastritis as a reaction to his medication.  I was able to reach out to the family medicine clinic Today, and the Provider Reports That Likely Will Be Able to Get Him Back on His Mounjaro Now That He Has Failed Victoza.  Patient Will Need to Follow-Up with Him in the Office As Soon As Possible and Will Contact Them to Schedule a New Appointment. [MT]    Clinical Course User Index [MT] Timohty Renbarger, Carola Rhine, MD                           Medical Decision Making Amount and/or Complexity of Data Reviewed Labs: ordered.  Risk OTC drugs. Prescription drug management.   This patient presents to the ED with concern for nausea, vomiting, headache. This involves an extensive number of treatment options, and is a complaint that carries with it a high risk of complications and morbidity.  The differential diagnosis includes medication side effect versus diabetic complication versus gastroparesis versus gastritis versus other  Doubt acute meningitis, cranial hemorrhage or injury.  Co-morbidities that complicate the patient evaluation: History of diabetes requiring medication control   External records from outside source obtained and reviewed including Ha1c October 20 2021 was 5.3.  I ordered and personally  interpreted labs.  The pertinent results include: No significant findings noted.  I have a lower suspicion for acute biliary disease, not feel needs emergent imaging of the abdomen.  The patient was maintained on a cardiac monitor.  I personally viewed and interpreted the cardiac monitored which showed an underlying rhythm of: Normal sinus rhythm  Per my interpretation the patient's ECG shows normal sinus rhythm no acute ischemic findings  I ordered medication including GI cocktail and IV Pepcid for suspected gastritis  I have reviewed the patients home medicines and have made adjustments as  needed   After the interventions noted above, I reevaluated the patient and found that they have: improved  Social Determinants of Health:Patient's PCP office will help arrange to try to get him back on Mounjaro as it is now clear that he has failed Victoza, and I strongly suspect the side effects he is experiencing are related to the Victoza  Dispostion:  After consideration of the diagnostic results and the patients response to treatment, I feel that the patent would benefit from close outpatient PCP follow-up..         Final Clinical Impression(s) / ED Diagnoses Final diagnoses:  Medication side effect  Nausea and vomiting, unspecified vomiting type    Rx / DC Orders ED Discharge Orders     None         Wyvonnia Dusky, MD 11/11/21 1136

## 2021-11-13 ENCOUNTER — Telehealth: Payer: 59 | Admitting: Family

## 2021-11-13 MED ORDER — TIRZEPATIDE 5 MG/0.5ML ~~LOC~~ SOAJ
5.0000 mg | SUBCUTANEOUS | 1 refills | Status: DC
Start: 2021-11-13 — End: 2022-07-29

## 2021-11-13 NOTE — Telephone Encounter (Signed)
Pt called requesting to speak with nurse Morrie Sheldon ASAP regarding his medication.

## 2021-11-13 NOTE — Telephone Encounter (Signed)
Patient Went to ER they took him off victoza. Wants to know what he needs to do now.

## 2021-11-13 NOTE — Telephone Encounter (Signed)
I have sent in [facility] Prescription sent to pharmacy

## 2021-11-13 NOTE — Telephone Encounter (Signed)
Patient aware and verbalized understanding.called [facility] states they will send over PA or Omeprazole

## 2021-11-16 NOTE — Telephone Encounter (Signed)
Called and spoke to clinical pharmacist and they have approved monjaro for a year.

## 2021-11-16 NOTE — Telephone Encounter (Signed)
Called to tell patient and Voice mail is full.

## 2021-11-16 NOTE — Telephone Encounter (Signed)
Pt called to talk to [place]. He says that he called [facility] [employer] and was given a phone number for nurse to call and [phone]. Nurse must ask for process to be EXPEDITED and give the name of diabetes medications that pt has already tried Metformin Trulicity Ozempic Victoza Pt asked for a call back to let him know this has been done.

## 2021-11-18 ENCOUNTER — Ambulatory Visit: Payer: 59 | Attending: Family

## 2021-11-18 ENCOUNTER — Other Ambulatory Visit: Payer: Self-pay

## 2021-11-18 DIAGNOSIS — M25561 Pain in right knee: Secondary | ICD-10-CM | POA: Insufficient documentation

## 2021-11-18 DIAGNOSIS — G8929 Other chronic pain: Secondary | ICD-10-CM | POA: Diagnosis not present

## 2021-11-18 DIAGNOSIS — M25661 Stiffness of right knee, not elsewhere classified: Secondary | ICD-10-CM | POA: Insufficient documentation

## 2021-11-18 NOTE — Therapy (Addendum)
OUTPATIENT PHYSICAL THERAPY LOWER EXTREMITY EVALUATION   Patient Name: RAAD CLAYSON MRN: [id number] DOB:[birth year], 46 y.o., male Today's Date: 11/18/2021   PT End of Session - 11/18/21 0818     Visit Number 1    Number of Visits 8    Date for PT Re-Evaluation 01/01/22    PT Start Time 0819    PT Stop Time 0854    PT Time Calculation (min) 35 min    Activity Tolerance Patient tolerated treatment well    Behavior During Therapy [facility] for tasks assessed/performed             Past Medical History:  Diagnosis Date   Anxiety    Complication of anesthesia    had hard time waking patient up in 2000   Dyspnea    increased exertion    History of kidney stones    Kidney stone 12/26/2017   Past Surgical History:  Procedure Laterality Date   CHOLECYSTECTOMY  11/24/2017   LAPROSCOPIC   CHOLECYSTECTOMY N/A 11/24/2017   Procedure: LAPAROSCOPIC CHOLECYSTECTOMY;  Surgeon: Abigail Miyamoto, MD;  Location: [facility] OR;  Service: General;  Laterality: N/A;   ELBOW SURGERY Right    EXTRACORPOREAL SHOCK WAVE LITHOTRIPSY Left 10/27/2017   Procedure: LEFT EXTRACORPOREAL SHOCK WAVE LITHOTRIPSY (ESWL);  Surgeon: Malen Gauze, MD;  Location: [facility];  Service: Urology;  Laterality: Left;   FINGER SURGERY Right    middle   TONSILLECTOMY     WISDOM TOOTH EXTRACTION     Patient Active Problem List   Diagnosis Date Noted   Chest pain 08/20/2019   Hypertension 06/05/2019   Kidney stone 12/26/2017   S/P laparoscopic cholecystectomy 11/24/2017   Diabetes mellitus (HCC) 02/16/2017   Hyperlipidemia associated with type 2 diabetes mellitus (HCC) 02/16/2017   Morbid obesity (HCC) 02/16/2017   GAD (generalized anxiety disorder) 05/25/2016   SOB (shortness of breath) 05/25/2016   REFERRING PROVIDER: [name], FNP  REFERRING DIAG: Chronic pain of right knee  THERAPY DIAG:  Stiffness of right knee, not elsewhere classified  Right knee pain, unspecified chronicity  Rationale for  Evaluation and Treatment Rehabilitation  ONSET DATE: over 6 weeks ago  SUBJECTIVE:   SUBJECTIVE STATEMENT: Patient reports that his right knee has been bothering him for a while. He is unable to recall any mechanism of injury to cause his symptoms. He has noticed that his pain is becoming more frequent since it first began. He thought his weight was the cause initially, but his symptoms have not changed since he lost weight. His left knee began to bother him about 2 weeks ago. He is unable to recall any history of lower extremity injuries prior to his knee pain. However, he notes that he had something like this happen years ago, but wearing a knee brace seemed to help.   PERTINENT HISTORY: DM and HTN  PAIN:  Are you having pain? Yes: NPRS scale: 8-10/10 Pain location: bilateral knees (R>L) Pain description: feels week and he does not have any power; intermittent sharp, stabbing pain  Aggravating factors: walking up stairs, squatting, lifting Relieving factors: not going up steps  PRECAUTIONS: None  WEIGHT BEARING RESTRICTIONS No  FALLS:  Has patient fallen in last 6 months? No  LIVING ENVIRONMENT: Lives with: lives with their family Lives in: House/apartment Stairs: Yes: External: 6 steps; on right going up step to pattern Has following equipment at home: None  OCCUPATION: requires lifting up to 150 pounds  PLOF: Independent  PATIENT GOALS be able  to squat, lift, and go up stairs without feeling like his knee is going to give out   OBJECTIVE:   DIAGNOSTIC FINDINGS: 10/12/21 x-ray on right knee  Trace patellofemoral spurring.  COGNITION:  Overall cognitive status: Within functional limits for tasks assessed     SENSATION: No numbness or tingling reported  PALPATION: TTP: right patella  JOINT MOBILITY:  Right patella: hypomobile and discomfort  LOWER EXTREMITY ROM:  Active ROM Right eval Left eval  Hip flexion    Hip extension    Hip abduction    Hip  adduction    Hip internal rotation    Hip external rotation    Knee flexion 108; limited by "jolt" of pain 124  Knee extension 0 0  Ankle dorsiflexion    Ankle plantarflexion    Ankle inversion    Ankle eversion     (Blank rows = not tested)  LOWER EXTREMITY MMT:  MMT Right eval Left eval  Hip flexion 4-/5 4+/5  Hip extension    Hip abduction    Hip adduction    Hip internal rotation    Hip external rotation    Knee flexion 5/5 5/5  Knee extension 4-/5 5/5  Ankle dorsiflexion 4+/5 4+/5  Ankle plantarflexion    Ankle inversion    Ankle eversion     (Blank rows = not tested)  LOWER EXTREMITY SPECIAL TESTS:  Knee special tests: Anterior drawer test: negative, Posterior drawer test: negative, McMurray's test: negative, and Patellafemoral grind test: positive   GAIT: Assistive device utilized: None Level of assistance: Complete Independence Comments: WFL     TODAY'S TREATMENT:                                   8/16 EXERCISE LOG  Exercise Repetitions and Resistance Comments  Hamstring stretch RLE; 3 x 30 seconds                    Blank cell = exercise not performed today    PATIENT EDUCATION:  Education details: POC, patellar tracking, prognosis, HEP, and patellar joint mobilizations  Person educated: Patient Education method: Explanation Education comprehension: verbalized understanding   HOME EXERCISE PROGRAM: [id number]  ASSESSMENT:  CLINICAL IMPRESSION: Patient is a 46 y.o. male who was seen today for physical therapy evaluation and treatment for right knee pain and weakness. He presented with low to moderate pain severity and irritability with patellar joint mobilizations being the most aggravating to his familiar symptoms. Recommend that he continue with skilled physical therapy to address his remaining impairments to return to his prior level of function.   PHYSICAL THERAPY DISCHARGE SUMMARY  Visits from Start of Care: 1  Current functional  level related to goals / functional outcomes: Patient was unable to meet his goals as he did not return following his initial evaluation.    Remaining deficits: See evaluation   Education / Equipment: HEP   Patient agrees to discharge. Patient goals were not met. Patient is being discharged due to not returning since the last visit.  Candi Leash, PT, DPT    OBJECTIVE IMPAIRMENTS decreased mobility, decreased ROM, decreased strength, hypomobility, and pain.   ACTIVITY LIMITATIONS lifting, squatting, stairs, and transfers  PARTICIPATION LIMITATIONS: occupation  PERSONAL FACTORS Profession, Time since onset of injury/illness/exacerbation, and 1-2 comorbidities: HTN and DM  are also affecting patient's functional outcome.   REHAB POTENTIAL: Good  CLINICAL DECISION MAKING: Evolving/moderate  complexity  EVALUATION COMPLEXITY: Moderate   GOALS: Goals reviewed with patient? Yes  LONG TERM GOALS: Target date: 12/16/2021   Patient will be independent with his HEP.  Baseline:  Goal status: INITIAL  2.  Patient will be able to complete his daily activities without his familiar pain exceeding 6/10. Baseline:  Goal status: INITIAL  3.  Patient will be able to navigate at least 4 steps with a reciprocal pattern for improved household mobility.  Baseline:  Goal status: INITIAL  4.  Patient with be able to squat and lift at least 20 pounds with proper lifting mechanics. Baseline: minimal knee flexion with significant lumbar flexion Goal status: INITIAL  PLAN: PT FREQUENCY: 1-2x/week  PT DURATION: 4 weeks  PLANNED INTERVENTIONS: Therapeutic exercises, Therapeutic activity, Neuromuscular re-education, Balance training, Patient/Family education, Joint mobilization, Stair training, Electrical stimulation, Cryotherapy, Moist heat, Taping, Vasopneumatic device, Manual therapy, and Re-evaluation  PLAN FOR NEXT SESSION: recumbent bike, patellar mobilizations, lower extremity  strengthening, and modalities as needed   Granville Lewis, PT 11/18/2021, 9:28 AM

## 2021-11-19 ENCOUNTER — Ambulatory Visit: Payer: 59 | Admitting: Family

## 2021-11-24 ENCOUNTER — Other Ambulatory Visit: Payer: Self-pay | Admitting: Family

## 2022-02-05 ENCOUNTER — Telehealth (INDEPENDENT_AMBULATORY_CARE_PROVIDER_SITE_OTHER): Payer: 59 | Admitting: Family Medicine

## 2022-02-05 ENCOUNTER — Encounter: Payer: Self-pay | Admitting: Family Medicine

## 2022-02-05 DIAGNOSIS — M25571 Pain in right ankle and joints of right foot: Secondary | ICD-10-CM

## 2022-02-05 DIAGNOSIS — M25562 Pain in left knee: Secondary | ICD-10-CM | POA: Diagnosis not present

## 2022-02-05 NOTE — Progress Notes (Signed)
Virtual Visit via telephone Note Due to COVID-19 pandemic this visit was conducted virtually. This visit type was conducted due to national recommendations for restrictions regarding the COVID-19 Pandemic (e.g. social distancing, sheltering in place) in an effort to limit this patient's exposure and mitigate transmission in our community. All issues noted in this document were discussed and addressed.  A physical exam was not performed with this format.   I connected with Ernest Miller on 02/05/2022 at 1530 by telephone and verified that I am speaking with the correct person using two identifiers. Ernest Miller is currently located at home and patient is currently with them during visit. The provider, Monia Pouch, FNP is located in their office at time of visit.  I discussed the limitations, risks, security and privacy concerns of performing an evaluation and management service by virtual visit and the availability of in person appointments. I also discussed with the patient that there may be a patient responsible charge related to this service. The patient expressed understanding and agreed to proceed.  Subjective:  Patient ID: Ernest Miller, male    DOB: 11-29-1975, 46 y.o.   MRN: [id number]  Chief Complaint:  Knee Pain and Ankle Pain   HPI: Ernest Miller is a 46 y.o. male presenting on 02/05/2022 for Knee Pain and Ankle Pain   Pt with left knee pain and right ankle pain. No known injuries. Has not improved with NSAID use. Has not used compression therapy.   Knee Pain  Incident onset: 3 weeks ago. There was no injury mechanism. The pain is present in the left knee. The pain is moderate. The pain has been Improving since onset. Pertinent negatives include no inability to bear weight, loss of motion, loss of sensation, muscle weakness, numbness or tingling. He reports no foreign bodies present. The symptoms are aggravated by movement and weight bearing. He has tried NSAIDs for the symptoms.  The treatment provided mild relief.  Ankle Pain  Incident onset: 2 weeks ago. There was no injury mechanism. The pain is present in the right ankle and right heel (right lateral ankle and heel). The quality of the pain is described as aching, burning and shooting. The pain is moderate. The pain has been Fluctuating since onset. Pertinent negatives include no inability to bear weight, loss of motion, loss of sensation, muscle weakness, numbness or tingling. He reports no foreign bodies present. The symptoms are aggravated by movement, palpation and weight bearing. He has tried NSAIDs for the symptoms. The treatment provided no relief.     Relevant past medical, surgical, family, and social history reviewed and updated as indicated.  Allergies and medications reviewed and updated.   Past Medical History:  Diagnosis Date   Anxiety    Complication of anesthesia    had hard time waking patient up in 2000   Dyspnea    increased exertion    History of kidney stones    Kidney stone 12/26/2017    Past Surgical History:  Procedure Laterality Date   CHOLECYSTECTOMY  11/24/2017   LAPROSCOPIC   CHOLECYSTECTOMY N/A 11/24/2017   Procedure: LAPAROSCOPIC CHOLECYSTECTOMY;  Surgeon: Coralie Keens, MD;  Location: [place] [place];  Service: General;  Laterality: N/A;   ELBOW SURGERY Right    EXTRACORPOREAL SHOCK WAVE LITHOTRIPSY Left 10/27/2017   Procedure: LEFT EXTRACORPOREAL SHOCK WAVE LITHOTRIPSY (ESWL);  Surgeon: Cleon Gustin, MD;  Location: [facility];  Service: Urology;  Laterality: Left;   FINGER SURGERY Right    middle  TONSILLECTOMY     WISDOM TOOTH EXTRACTION      Social History   Socioeconomic History   Marital status: Significant Other    Spouse name: Not on file   Number of children: Not on file   Years of education: Not on file   Highest education level: Not on file  Occupational History   Not on file  Tobacco Use   Smoking status: Never   Smokeless tobacco: Never  Vaping Use    Vaping Use: Never used  Substance and Sexual Activity   Alcohol use: No   Drug use: Yes    Types: Marijuana   Sexual activity: Yes  Other Topics Concern   Not on file  Social History Narrative   Not on file   Social Determinants of Health   Financial Resource Strain: Not on file  Food Insecurity: Not on file  Transportation Needs: Not on file  Physical Activity: Not on file  Stress: Not on file  Social Connections: Not on file  Intimate Partner Violence: Not on file    Outpatient Encounter Medications as of 02/05/2022  Medication Sig   glucose blood (ACCU-CHEK GUIDE) test strip check blood sugar up to 4 times a day Dx: E11.9   blood glucose meter kit and supplies KIT Dispense based on patient and insurance preference. Use up to four times daily as directed.   desvenlafaxine (PRISTIQ) 100 MG 24 hr tablet Take 1 tablet (100 mg total) by mouth daily. (Patient not taking: Reported on 11/11/2021)   diclofenac (VOLTAREN) 75 MG EC tablet Take 1 tablet (75 mg total) by mouth 2 (two) times daily.   Insulin Pen Needle 32G X 4 MM MISC Use with insulin daily   ketoconazole (NIZORAL) 2 % cream Apply 1 application topically daily.   losartan (COZAAR) 25 MG tablet Take 1 tablet (25 mg total) by mouth daily.   metFORMIN (GLUCOPHAGE XR) 750 MG 24 hr tablet Take 2 tablets (1,500 mg total) by mouth daily with breakfast.   nystatin (MYCOSTATIN/NYSTOP) powder Apply 1 application topically 3 (three) times daily.   omeprazole (PRILOSEC) 40 MG capsule TAKE 1 CAPSULE BY MOUTH EVERY DAY   rosuvastatin (CRESTOR) 5 MG tablet Take 1 tablet (5 mg total) by mouth daily.   tirzepatide [facility]) 5 MG/0.5ML Pen Inject 5 mg into the skin once a week.   traZODone (DESYREL) 50 MG tablet Take 1-2 tablets (50-100 mg total) by mouth at bedtime as needed for sleep.   No facility-administered encounter medications on file as of 02/05/2022.    Allergies  Allergen Reactions   Jardiance [Empagliflozin] Rash    Review  of Systems  Constitutional:  Negative for activity change, appetite change, chills, diaphoresis, fatigue, fever and unexpected weight change.  HENT: Negative.    Eyes: Negative.   Respiratory:  Negative for cough, chest tightness and shortness of breath.   Cardiovascular:  Negative for chest pain, palpitations and leg swelling.  Gastrointestinal:  Negative for abdominal pain, blood in stool, constipation, diarrhea, nausea and vomiting.  Endocrine: Negative.   Genitourinary:  Negative for decreased urine volume, difficulty urinating, dysuria, frequency and urgency.  Musculoskeletal:  Positive for arthralgias, gait problem and myalgias. Negative for back pain, joint swelling, neck pain and neck stiffness.  Skin: Negative.   Allergic/Immunologic: Negative.   Neurological:  Negative for dizziness, tingling, weakness, numbness and headaches.  Hematological: Negative.   Psychiatric/Behavioral:  Negative for confusion, hallucinations, sleep disturbance and suicidal ideas.   All other systems reviewed and are  negative.        Observations/Objective: No vital signs or physical exam, this was a virtual health encounter.  Pt alert and oriented, answers all questions appropriately, and able to speak in full sentences.    Assessment and Plan: Gildardo was seen today for knee pain and ankle pain.  Diagnoses and all orders for this visit:  Acute pain of left knee RICE therapy discussed in detail. Will come to office for imaging. Further treatment pending results. Report new, worsening, or persistent symptoms.  -     DG Knee 1-2 Views Left; Future  Acute right ankle pain RICE therapy discussed in detail. Will come to office for imaging. Further treatment pending results. Report new, worsening, or persistent symptoms.  -     DG Ankle Complete Right     Follow Up Instructions: Return if symptoms worsen or fail to improve.    I discussed the assessment and treatment plan with the patient.  The patient was provided an opportunity to ask questions and all were answered. The patient agreed with the plan and demonstrated an understanding of the instructions.   The patient was advised to call back or seek an in-person evaluation if the symptoms worsen or if the condition fails to improve as anticipated.  The above assessment and management plan was discussed with the patient. The patient verbalized understanding of and has agreed to the management plan. Patient is aware to call the clinic if they develop any new symptoms or if symptoms persist or worsen. Patient is aware when to return to the clinic for a follow-up visit. Patient educated on when it is appropriate to go to the emergency department.    I provided 12 minutes of time during this telephone encounter.   Monia Pouch, FNP-C [facility] [address] [place], [place] [address] [phone] 02/05/2022

## 2022-02-11 ENCOUNTER — Other Ambulatory Visit (INDEPENDENT_AMBULATORY_CARE_PROVIDER_SITE_OTHER): Payer: 59

## 2022-02-11 ENCOUNTER — Other Ambulatory Visit: Payer: Self-pay | Admitting: Family Medicine

## 2022-02-11 DIAGNOSIS — M25571 Pain in right ankle and joints of right foot: Secondary | ICD-10-CM

## 2022-02-11 DIAGNOSIS — M25562 Pain in left knee: Secondary | ICD-10-CM | POA: Diagnosis not present

## 2022-02-11 DIAGNOSIS — M7989 Other specified soft tissue disorders: Secondary | ICD-10-CM | POA: Diagnosis not present

## 2022-02-11 DIAGNOSIS — S99911A Unspecified injury of right ankle, initial encounter: Secondary | ICD-10-CM | POA: Diagnosis not present

## 2022-02-11 NOTE — Progress Notes (Signed)
Patient returning call. Please call back

## 2022-02-16 ENCOUNTER — Ambulatory Visit: Payer: 59 | Admitting: Orthopaedic Surgery

## 2022-05-07 ENCOUNTER — Encounter: Payer: Self-pay | Admitting: Nurse Practitioner

## 2022-05-07 ENCOUNTER — Ambulatory Visit (INDEPENDENT_AMBULATORY_CARE_PROVIDER_SITE_OTHER): Payer: 59

## 2022-05-07 ENCOUNTER — Ambulatory Visit (INDEPENDENT_AMBULATORY_CARE_PROVIDER_SITE_OTHER): Payer: 59 | Admitting: Nurse Practitioner

## 2022-05-07 VITALS — BP 128/81 | HR 78 | Temp 98.7°F | Resp 20 | Ht 71.0 in | Wt 289.0 lb

## 2022-05-07 DIAGNOSIS — M25572 Pain in left ankle and joints of left foot: Secondary | ICD-10-CM

## 2022-05-07 MED ORDER — NAPROXEN 500 MG PO TABS: 500.0000 mg | ORAL_TABLET | Freq: Two times a day (BID) | ORAL | 1 refills | Status: DC

## 2022-05-07 NOTE — Progress Notes (Signed)
Subjective:    Patient ID: Ernest Miller, male    DOB: 03-Aug-1975, 47 y.o.   MRN: [id number]   Chief Complaint: Ankle Pain (Left ankle. No injury/)   Ankle Pain  The incident occurred 2 days ago. Incident location: no injury. There was no injury mechanism. The pain is present in the left ankle. The quality of the pain is described as shooting and aching. The pain is at a severity of 7/10. The pain is moderate. The pain has been Fluctuating since onset. Associated symptoms include an inability to bear weight. Pertinent negatives include no loss of motion or loss of sensation. The symptoms are aggravated by movement and weight bearing. He has tried acetaminophen for the symptoms. The treatment provided mild relief.       Review of Systems  Constitutional:  Negative for diaphoresis.  Eyes:  Negative for pain.  Respiratory:  Negative for shortness of breath.   Cardiovascular:  Negative for chest pain, palpitations and leg swelling.  Gastrointestinal:  Negative for abdominal pain.  Endocrine: Negative for polydipsia.  Skin:  Negative for rash.  Neurological:  Negative for dizziness, weakness and headaches.  Hematological:  Does not bruise/bleed easily.  All other systems reviewed and are negative.      Objective:   Physical Exam Vitals reviewed.  Constitutional:      Appearance: Normal appearance. He is obese.  Cardiovascular:     Rate and Rhythm: Normal rate and regular rhythm.     Heart sounds: Normal heart sounds.  Pulmonary:     Effort: Pulmonary effort is normal.     Breath sounds: Normal breath sounds.  Musculoskeletal:     Comments: Mild lateral edema of left ankle with pain on palpation Pain with movement in any direction.  Skin:    General: Skin is warm.  Neurological:     General: No focal deficit present.     Mental Status: He is alert and oriented to person, place, and time.  Psychiatric:        Mood and Affect: Mood normal.        Behavior: Behavior normal.     BP 128/81   Pulse 78   Temp 98.7 F (37.1 C) (Temporal)   Resp 20   Ht 5\' 11"  (1.803 m)   Wt 289 lb (131.1 kg)   SpO2 94%   BMI 40.31 kg/m    Left ankle xray- no acute findings- no fracture-Preliminary reading by Ronnald Collum, FNP  [facility]      Assessment & Plan:   [name] in today with chief complaint of Ankle Pain (Left ankle. No injury/)   1. Acute left ankle pain Elevate  Ice Compression wrap rest - DG Ankle Complete Left  Meds ordered this encounter  Medications   naproxen (NAPROSYN) 500 MG tablet    Sig: Take 1 tablet (500 mg total) by mouth 2 (two) times daily with a meal.    Dispense:  60 tablet    Refill:  1    Order Specific Question:   Supervising Provider    Answer:   Caryl Pina A [[id number]]     The above assessment and management plan was discussed with the patient. The patient verbalized understanding of and has agreed to the management plan. Patient is aware to call the clinic if symptoms persist or worsen. Patient is aware when to return to the clinic for a follow-up visit. Patient educated on when it is appropriate to go to the emergency  department.   Mary-Margaret Hassell Done, FNP

## 2022-05-07 NOTE — Patient Instructions (Signed)
Ankle Sprain  An ankle sprain is a stretch or tear in one of the tough tissues (ligaments) that connect the bones in your ankle. An ankle sprain can happen when the ankle rolls outward (inversion sprain) or inward (eversion sprain). What are the causes? This condition is caused by rolling or twisting the ankle. What increases the risk? You are more likely to develop this condition if you play sports. What are the signs or symptoms? Symptoms of this condition include: Pain in your ankle. Swelling. Bruising. This may happen right after you sprain your ankle or 1-2 days later. Trouble standing or walking. How is this diagnosed? This condition is diagnosed with: A physical exam. During the exam, your doctor will press on certain parts of your foot and ankle and try to move them in certain ways. X-ray imaging. These may be taken to see how bad the sprain is and to check for broken bones. How is this treated? This condition may be treated with: A brace or splint. This is used to keep the ankle from moving until it heals. An elastic bandage. This is used to support the ankle. Crutches. Pain medicine. Surgery. This may be needed if the sprain is very bad. Physical therapy. This may help to improve movement in the ankle. Follow these instructions at home: If you have a brace or a splint: Wear the brace or splint as told by your doctor. Remove it only as told by your doctor. Loosen the brace or splint if your toes: Tingle. Lose feeling (become numb). Turn cold and blue. Keep the brace or splint clean. If the brace or splint is not waterproof: Do not let it get wet. Cover it with a watertight covering when you take a bath or a shower. If you have an elastic bandage (dressing): Remove it to shower or bathe. Try not to move your ankle much, but wiggle your toes from time to time. This helps to prevent swelling. Adjust the dressing if it feels too tight. Loosen the dressing if your  foot: Loses feeling. Tingles. Becomes cold and blue. Managing pain, stiffness, and swelling  Take over-the-counter and prescription medicines only as told by doctor. For 2-3 days, keep your ankle raised (elevated) above the level of your heart. If told, put ice on the injured area: If you have a removable brace or splint, remove it as told by your doctor. Put ice in a plastic bag. Place a towel between your skin and the bag. Leave the ice on for 20 minutes, 2-3 times a day. General instructions Rest your ankle. Do not use your injured leg to support your body weight until your doctor says that you can. Use crutches as told by your doctor. Do not use any products that contain nicotine or tobacco, such as cigarettes, e-cigarettes, and chewing tobacco. If you need help quitting, ask your doctor. Keep all follow-up visits as told by your doctor. Contact a doctor if: Your bruises or swelling are quickly getting worse. Your pain does not get better after you take medicine. Get help right away if: You cannot feel your toes or foot. Your foot or toes look blue. You have very bad pain that gets worse. Summary An ankle sprain is a stretch or tear in one of the tough tissues (ligaments) that connect the bones in your ankle. This condition is caused by rolling or twisting the ankle. Symptoms include pain, swelling, bruising, and trouble walking. To help with pain and swelling, put ice on the injured  ankle, raise your ankle above the level of your heart, and use an elastic bandage. Also, rest as told by your doctor. Keep all follow-up visits as told by your doctor. This is important. This information is not intended to replace advice given to you by your health care provider. Make sure you discuss any questions you have with your health care provider. Document Revised: 05/13/2020 Document Reviewed: 05/15/2020 Elsevier Patient Education  2023 [employer]  

## 2022-07-29 ENCOUNTER — Other Ambulatory Visit: Payer: Self-pay | Admitting: Family

## 2022-07-29 ENCOUNTER — Ambulatory Visit (INDEPENDENT_AMBULATORY_CARE_PROVIDER_SITE_OTHER): Payer: 59 | Admitting: Family

## 2022-07-29 ENCOUNTER — Encounter: Payer: Self-pay | Admitting: Family

## 2022-07-29 VITALS — BP 134/82 | HR 65 | Temp 97.7°F | Ht 71.0 in | Wt 290.0 lb

## 2022-07-29 DIAGNOSIS — Z0000 Encounter for general adult medical examination without abnormal findings: Secondary | ICD-10-CM | POA: Diagnosis not present

## 2022-07-29 DIAGNOSIS — F411 Generalized anxiety disorder: Secondary | ICD-10-CM | POA: Diagnosis not present

## 2022-07-29 DIAGNOSIS — K219 Gastro-esophageal reflux disease without esophagitis: Secondary | ICD-10-CM | POA: Insufficient documentation

## 2022-07-29 DIAGNOSIS — Z6841 Body Mass Index (BMI) 40.0 and over, adult: Secondary | ICD-10-CM

## 2022-07-29 DIAGNOSIS — B351 Tinea unguium: Secondary | ICD-10-CM

## 2022-07-29 DIAGNOSIS — I10 Essential (primary) hypertension: Secondary | ICD-10-CM | POA: Diagnosis not present

## 2022-07-29 DIAGNOSIS — E785 Hyperlipidemia, unspecified: Secondary | ICD-10-CM | POA: Diagnosis not present

## 2022-07-29 DIAGNOSIS — Z7985 Long-term (current) use of injectable non-insulin antidiabetic drugs: Secondary | ICD-10-CM

## 2022-07-29 DIAGNOSIS — E1169 Type 2 diabetes mellitus with other specified complication: Secondary | ICD-10-CM

## 2022-07-29 LAB — LIPID PANEL

## 2022-07-29 LAB — BAYER DCA HB A1C WAIVED: HB A1C (BAYER DCA - WAIVED): 6.1 % — ABNORMAL HIGH (ref 4.8–5.6)

## 2022-07-29 MED ORDER — OMEPRAZOLE 40 MG PO CPDR: 40.0000 mg | DELAYED_RELEASE_CAPSULE | Freq: Every day | ORAL | 3 refills | Status: DC

## 2022-07-29 MED ORDER — ROSUVASTATIN CALCIUM 5 MG PO TABS: 5.0000 mg | ORAL_TABLET | Freq: Every day | ORAL | 3 refills | Status: DC

## 2022-07-29 MED ORDER — TIRZEPATIDE 2.5 MG/0.5ML ~~LOC~~ SOAJ: 2.5000 mg | SUBCUTANEOUS | 1 refills | Status: DC

## 2022-07-29 MED ORDER — TERBINAFINE HCL 250 MG PO TABS: 250.0000 mg | ORAL_TABLET | Freq: Every day | ORAL | 0 refills | Status: DC

## 2022-07-29 MED ORDER — LOSARTAN POTASSIUM 25 MG PO TABS: 25.0000 mg | ORAL_TABLET | Freq: Every day | ORAL | 3 refills | Status: DC

## 2022-07-29 NOTE — Progress Notes (Signed)
Subjective:    Patient ID: Ernest Miller, male    DOB: Jan 28, 1976, 47 y.o.   MRN: [id number]  Chief Complaint  Patient presents with   Medical Management of Chronic Issues    WANTS PILL FOR DM    Fatigue   PT presents to the office today for CPE. He reports he is not taking any of his medications.   He reports he stopped Mounjaro a few weeks ago because abdominal pain and burping.  Diabetes He presents for his follow-up diabetic visit. He has type 2 diabetes mellitus. Hypoglycemia symptoms include nervousness/anxiousness. Associated symptoms include blurred vision. Pertinent negatives for diabetes include no foot paresthesias. Symptoms are stable. Risk factors for coronary artery disease include dyslipidemia and diabetes mellitus. He is following a generally unhealthy diet. His dinner blood glucose range is generally 180-200 mg/dl. Eye exam is not current.  Hypertension This is a chronic problem. The current episode started more than 1 year ago. The problem has been waxing and waning since onset. The problem is uncontrolled. Associated symptoms include anxiety, blurred vision and malaise/fatigue. Pertinent negatives include no peripheral edema or shortness of breath. Risk factors for coronary artery disease include dyslipidemia, obesity, male gender, diabetes mellitus and sedentary lifestyle. Past treatments include nothing. The current treatment provides no improvement.  Hyperlipidemia This is a chronic problem. The current episode started more than 1 year ago. Exacerbating diseases include obesity. Pertinent negatives include no shortness of breath. He is currently on no antihyperlipidemic treatment. The current treatment provides no improvement of lipids. Risk factors for coronary artery disease include dyslipidemia, diabetes mellitus, hypertension, male sex and a sedentary lifestyle.  Anxiety Presents for follow-up visit. Symptoms include excessive worry, irritability, nervous/anxious  behavior and restlessness. Patient reports no shortness of breath. Symptoms occur most days. The severity of symptoms is moderate.    Gastroesophageal Reflux He complains of abdominal pain, belching, heartburn and a hoarse voice. This is a chronic problem. The current episode started more than 1 year ago. The problem occurs occasionally. He has tried a PPI for the symptoms. The treatment provided moderate relief.      Review of Systems  Constitutional:  Positive for irritability and malaise/fatigue.  HENT:  Positive for hoarse voice.   Eyes:  Positive for blurred vision.  Respiratory:  Negative for shortness of breath.   Gastrointestinal:  Positive for abdominal pain and heartburn.  Psychiatric/Behavioral:  The patient is nervous/anxious.   All other systems reviewed and are negative.  History reviewed. No pertinent family history. Social History   Socioeconomic History   Marital status: Significant Other    Spouse name: Not on file   Number of children: Not on file   Years of education: Not on file   Highest education level: Not on file  Occupational History   Not on file  Tobacco Use   Smoking status: Never   Smokeless tobacco: Never  Vaping Use   Vaping Use: Never used  Substance and Sexual Activity   Alcohol use: No   Drug use: Yes    Types: Marijuana   Sexual activity: Yes  Other Topics Concern   Not on file  Social History Narrative   Not on file   Social Determinants of Health   Financial Resource Strain: Not on file  Food Insecurity: Not on file  Transportation Needs: Not on file  Physical Activity: Not on file  Stress: Not on file  Social Connections: Not on file       Objective:  Physical Exam Vitals reviewed.  Constitutional:      General: He is not in acute distress.    Appearance: He is well-developed. He is obese.  HENT:     Head: Normocephalic.     Right Ear: Tympanic membrane normal.     Left Ear: Tympanic membrane normal.  Eyes:      General:        Right eye: No discharge.        Left eye: No discharge.     Pupils: Pupils are equal, round, and reactive to light.  Neck:     Thyroid: No thyromegaly.  Cardiovascular:     Rate and Rhythm: Normal rate and regular rhythm.     Heart sounds: Normal heart sounds. No murmur heard. Pulmonary:     Effort: Pulmonary effort is normal. No respiratory distress.     Breath sounds: Normal breath sounds. No wheezing.  Abdominal:     General: Bowel sounds are normal. There is no distension.     Palpations: Abdomen is soft.     Tenderness: There is no abdominal tenderness.  Musculoskeletal:        General: No tenderness. Normal range of motion.     Cervical back: Normal range of motion and neck supple.  Skin:    General: Skin is warm and dry.     Findings: No erythema or rash.  Neurological:     Mental Status: He is alert and oriented to person, place, and time.     Cranial Nerves: No cranial nerve deficit.     Deep Tendon Reflexes: Reflexes are normal and symmetric.  Psychiatric:        Behavior: Behavior normal.        Thought Content: Thought content normal.        Judgment: Judgment normal.     Diabetic Foot Exam - Simple   Simple Foot Form Diabetic Foot exam was performed with the following findings: Yes 07/29/2022 10:06 AM  Visual Inspection No deformities, no ulcerations, no other skin breakdown bilaterally: Yes Sensation Testing Intact to touch and monofilament testing bilaterally: Yes Pulse Check Posterior Tibialis and Dorsalis pulse intact bilaterally: Yes Comments Pt has toenail thickness      BP 134/82   Pulse 65   Temp 97.7 F (36.5 C) (Temporal)   Ht 5\' 11"  (1.803 m)   Wt 290 lb (131.5 kg)   SpO2 98%   BMI 40.45 kg/m      Assessment & Plan:  Ernest Miller comes in today with chief complaint of Medical Management of Chronic Issues (WANTS PILL FOR DM ) and Fatigue   Diagnosis and orders addressed:  1. Gastroesophageal reflux disease  -  omeprazole (PRILOSEC) 40 MG capsule; Take 1 capsule (40 mg total) by mouth daily.  Dispense: 90 capsule; Refill: 3 - CBC with Differential/Platelet - CMP14+EGFR  2. Annual physical exam  - Bayer DCA Hb A1c Waived - CBC with Differential/Platelet - CMP14+EGFR - Lipid panel - TSH  3. Type 2 diabetes mellitus with other specified complication, without long-term current use of insulin Restart Mounjaro 2.5 mg  Low carb  - tirzepatide (MOUNJARO) 2.5 MG/0.5ML Pen; Inject 2.5 mg into the skin once a week.  Dispense: 2 mL; Refill: 1 - CBC with Differential/Platelet - CMP14+EGFR - Microalbumin / creatinine urine ratio  4. Hyperlipidemia associated with type 2 diabetes mellitus Restart Crestor 5 mg  - CBC with Differential/Platelet - CMP14+EGFR - rosuvastatin (CRESTOR) 5 MG tablet; Take 1 tablet (5  mg total) by mouth daily.  Dispense: 90 tablet; Refill: 3  5. Primary hypertension Restart Losartan 25 mg  - CBC with Differential/Platelet - CMP14+EGFR - losartan (COZAAR) 25 MG tablet; Take 1 tablet (25 mg total) by mouth daily.  Dispense: 90 tablet; Refill: 3  6. Morbid obesity - CBC with Differential/Platelet - CMP14+EGFR  7. GAD (generalized anxiety disorder) - CBC with Differential/Platelet - CMP14+EGFR  8. Onychomycosis Start lamisil 250 mg  - terbinafine (LAMISIL) 250 MG tablet; Take 1 tablet (250 mg total) by mouth daily.  Dispense: 90 tablet; Refill: 0   Labs pending Health Maintenance reviewed Diet and exercise encouraged  Follow up plan: 1 month to recheck DM and HTN  Jannifer Rodney, FNP

## 2022-07-29 NOTE — Patient Instructions (Signed)

## 2022-07-30 LAB — LIPID PANEL
Chol/HDL Ratio: 5.6 ratio — ABNORMAL HIGH (ref 0.0–5.0)
Cholesterol, Total: 191 mg/dL (ref 100–199)
HDL: 34 mg/dL — ABNORMAL LOW (ref >39)
LDL Chol Calc (NIH): 129 mg/dL — ABNORMAL HIGH (ref 0–99)
Triglycerides: 155 mg/dL — ABNORMAL HIGH (ref 0–149)
VLDL Cholesterol Cal: 28 mg/dL (ref 5–40)

## 2022-07-30 LAB — CBC WITH DIFFERENTIAL/PLATELET
Basophils Absolute: 0.2 10*3/uL (ref 0.0–0.2)
Basos: 2 %
EOS (ABSOLUTE): 0.8 10*3/uL — ABNORMAL HIGH (ref 0.0–0.4)
Eos: 9 %
Hematocrit: 48 % (ref 37.5–51.0)
Hemoglobin: 16.2 g/dL (ref 13.0–17.7)
Immature Grans (Abs): 0 10*3/uL (ref 0.0–0.1)
Immature Granulocytes: 0 %
Lymphocytes Absolute: 2.8 10*3/uL (ref 0.7–3.1)
Lymphs: 30 %
MCH: 30.3 pg (ref 26.6–33.0)
MCHC: 33.8 g/dL (ref 31.5–35.7)
MCV: 90 fL (ref 79–97)
Monocytes Absolute: 0.8 10*3/uL (ref 0.1–0.9)
Monocytes: 9 %
Neutrophils Absolute: 4.9 10*3/uL (ref 1.4–7.0)
Neutrophils: 50 %
Platelets: 284 10*3/uL (ref 150–450)
RBC: 5.34 x10E6/uL (ref 4.14–5.80)
RDW: 12.5 % (ref 11.6–15.4)
WBC: 9.4 10*3/uL (ref 3.4–10.8)

## 2022-07-30 LAB — CMP14+EGFR
ALT: 25 IU/L (ref 0–44)
AST: 21 IU/L (ref 0–40)
Albumin/Globulin Ratio: 1.5 (ref 1.2–2.2)
Albumin: 4.3 g/dL (ref 4.1–5.1)
Alkaline Phosphatase: 68 IU/L (ref 44–121)
BUN/Creatinine Ratio: 17 (ref 9–20)
BUN: 19 mg/dL (ref 6–24)
Bilirubin Total: 0.4 mg/dL (ref 0.0–1.2)
CO2: 24 mmol/L (ref 20–29)
Calcium: 9.6 mg/dL (ref 8.7–10.2)
Chloride: 103 mmol/L (ref 96–106)
Creatinine, Ser: 1.15 mg/dL (ref 0.76–1.27)
Globulin, Total: 2.8 g/dL (ref 1.5–4.5)
Glucose: 117 mg/dL — ABNORMAL HIGH (ref 70–99)
Potassium: 5.1 mmol/L (ref 3.5–5.2)
Sodium: 140 mmol/L (ref 134–144)
Total Protein: 7.1 g/dL (ref 6.0–8.5)
eGFR: 79 mL/min/{1.73_m2} (ref >59)

## 2022-07-30 LAB — TSH: TSH: 0.755 u[IU]/mL (ref 0.450–4.500)

## 2022-07-30 LAB — MICROALBUMIN / CREATININE URINE RATIO
Creatinine, Urine: 232.9 mg/dL
Microalb/Creat Ratio: 7 mg/g creat (ref 0–29)
Microalbumin, Urine: 17.1 ug/mL

## 2022-08-02 ENCOUNTER — Telehealth: Payer: Self-pay | Admitting: Family

## 2022-08-06 ENCOUNTER — Other Ambulatory Visit (HOSPITAL_COMMUNITY): Payer: Self-pay

## 2022-08-06 NOTE — Telephone Encounter (Signed)
Per insurance, no PA is required. Max of 30 day supply at a time ($0.00 co-pay). Will not pay for 90 day supply.

## 2022-08-06 NOTE — Telephone Encounter (Signed)
Fran Lowes (KeyDanie Chandler) PA Case ID #: [id number] Need Help? Call us at [phone] Status sent iconSent to Plan today Drug Omeprazole 40MG  dr capsules ePA cloud logo Form [employer] Electronic PA Form [phone] NCPDP)

## 2022-08-10 ENCOUNTER — Telehealth: Payer: Self-pay

## 2022-08-10 ENCOUNTER — Other Ambulatory Visit (HOSPITAL_COMMUNITY): Payer: Self-pay

## 2022-08-10 NOTE — Telephone Encounter (Signed)
PA request received via provider for Mounjaro 2.5MG /0.5ML pen-injectors  PA has been submitted to [employer] and is pending additional questions/ determination  Key: [id number]

## 2022-08-10 NOTE — Telephone Encounter (Signed)
PA not needed for Omeprazole, test claim showed 30 day supply was covered. See additional encounter for [facility]

## 2022-08-11 ENCOUNTER — Other Ambulatory Visit (HOSPITAL_COMMUNITY): Payer: Self-pay

## 2022-08-11 NOTE — Telephone Encounter (Signed)
See previous note, PA not needed

## 2022-08-12 NOTE — Telephone Encounter (Signed)
See previous note, do not route back to PA team please 

## 2022-08-19 NOTE — Telephone Encounter (Signed)
PA has been APPROVED from 08/10/2022-08/09/2023

## 2022-08-19 NOTE — Telephone Encounter (Signed)
PA not needed, see additional encounter

## 2022-08-23 NOTE — Telephone Encounter (Signed)
Erroneous encounter will close.

## 2022-09-01 NOTE — Telephone Encounter (Signed)
Please sign off on encounter as PA team is unable to resolve Rx Requests. Thanks  

## 2022-09-02 ENCOUNTER — Other Ambulatory Visit: Payer: Self-pay | Admitting: Family

## 2022-09-02 DIAGNOSIS — K219 Gastro-esophageal reflux disease without esophagitis: Secondary | ICD-10-CM

## 2022-09-02 MED ORDER — OMEPRAZOLE 40 MG PO CPDR: 40.0000 mg | DELAYED_RELEASE_CAPSULE | Freq: Every day | ORAL | 3 refills | Status: DC

## 2022-09-02 NOTE — Addendum Note (Signed)
Addended by: Julious Payer D on: 09/02/2022 03:54 PM   Modules accepted: Orders

## 2022-09-02 NOTE — Telephone Encounter (Signed)
Refill failed. resent °

## 2022-09-03 ENCOUNTER — Telehealth: Payer: Self-pay

## 2022-09-03 ENCOUNTER — Other Ambulatory Visit (HOSPITAL_COMMUNITY): Payer: Self-pay

## 2022-09-03 ENCOUNTER — Telehealth: Payer: Self-pay | Admitting: Family

## 2022-09-03 NOTE — Telephone Encounter (Signed)
Patient Advocate Encounter   Received notification from [employer] that prior authorization for Omeprazole is required.   PA submitted on 09/03/2022 Key [id number] [employer] Status is pending

## 2022-09-03 NOTE — Telephone Encounter (Signed)
PA has been submitted and documented in separate encounter

## 2022-09-03 NOTE — Telephone Encounter (Signed)
Name from pharmacy: OMEPRAZOLE DR 40 MG CAPSULE  Pharmacy comment: Alternative Requested:PRIOR AUTHORIZATION NEEDED FOR MORE THAN 90 CAPSULES EVERY 365 DAYS.

## 2022-09-03 NOTE — Telephone Encounter (Signed)
Pharmacy aware

## 2022-09-03 NOTE — Telephone Encounter (Signed)
PA cancelled in [facility]. Called insurance to initiate PA.

## 2022-09-03 NOTE — Telephone Encounter (Signed)
Patient Advocate Encounter  Prior Authorization for Omeprazole 40mg  has been approved with [employer].    PA# [id number] Effective dates: 09/03/22 through 09/03/23  Per [facility] test claim, copay for 30 days supply is $0   *Per representative, insurance will only allow 30 day supply at a time.

## 2022-09-06 NOTE — Telephone Encounter (Signed)
Approved in separate encounter. Pharmacy aware.

## 2022-09-09 ENCOUNTER — Ambulatory Visit (INDEPENDENT_AMBULATORY_CARE_PROVIDER_SITE_OTHER): Payer: 59 | Admitting: Family

## 2022-09-09 ENCOUNTER — Encounter: Payer: Self-pay | Admitting: Family

## 2022-09-09 ENCOUNTER — Encounter (INDEPENDENT_AMBULATORY_CARE_PROVIDER_SITE_OTHER): Payer: Self-pay | Admitting: *Deleted

## 2022-09-09 VITALS — BP 136/84 | HR 70 | Temp 98.0°F | Ht 71.0 in | Wt 284.0 lb

## 2022-09-09 DIAGNOSIS — Z6839 Body mass index (BMI) 39.0-39.9, adult: Secondary | ICD-10-CM

## 2022-09-09 DIAGNOSIS — Z1211 Encounter for screening for malignant neoplasm of colon: Secondary | ICD-10-CM

## 2022-09-09 DIAGNOSIS — F411 Generalized anxiety disorder: Secondary | ICD-10-CM | POA: Diagnosis not present

## 2022-09-09 DIAGNOSIS — E785 Hyperlipidemia, unspecified: Secondary | ICD-10-CM

## 2022-09-09 DIAGNOSIS — E1169 Type 2 diabetes mellitus with other specified complication: Secondary | ICD-10-CM

## 2022-09-09 NOTE — Patient Instructions (Signed)
Hypertension, Adult High blood pressure (hypertension) is when the force of blood pumping through the arteries is too strong. The arteries are the blood vessels that carry blood from the heart throughout the body. Hypertension forces the heart to work harder to pump blood and may cause arteries to become narrow or stiff. Untreated or uncontrolled hypertension can lead to a heart attack, heart failure, a stroke, kidney disease, and other problems. A blood pressure reading consists of a higher number over a lower number. Ideally, your blood pressure should be below 120/80. The first ("top") number is called the systolic pressure. It is a measure of the pressure in your arteries as your heart beats. The second ("bottom") number is called the diastolic pressure. It is a measure of the pressure in your arteries as the heart relaxes. What are the causes? The exact cause of this condition is not known. There are some conditions that result in high blood pressure. What increases the risk? Certain factors may make you more likely to develop high blood pressure. Some of these risk factors are under your control, including: Smoking. Not getting enough exercise or physical activity. Being overweight. Having too much fat, sugar, calories, or salt (sodium) in your diet. Drinking too much alcohol. Other risk factors include: Having a personal history of heart disease, diabetes, high cholesterol, or kidney disease. Stress. Having a family history of high blood pressure and high cholesterol. Having obstructive sleep apnea. Age. The risk increases with age. What are the signs or symptoms? High blood pressure may not cause symptoms. Very high blood pressure (hypertensive crisis) may cause: Headache. Fast or irregular heartbeats (palpitations). Shortness of breath. Nosebleed. Nausea and vomiting. Vision changes. Severe chest pain, dizziness, and seizures. How is this diagnosed? This condition is diagnosed by  measuring your blood pressure while you are seated, with your arm resting on a flat surface, your legs uncrossed, and your feet flat on the floor. The cuff of the blood pressure monitor will be placed directly against the skin of your upper arm at the level of your heart. Blood pressure should be measured at least twice using the same arm. Certain conditions can cause a difference in blood pressure between your right and left arms. If you have a high blood pressure reading during one visit or you have normal blood pressure with other risk factors, you may be asked to: Return on a different day to have your blood pressure checked again. Monitor your blood pressure at home for 1 week or longer. If you are diagnosed with hypertension, you may have other blood or imaging tests to help your health care provider understand your overall risk for other conditions. How is this treated? This condition is treated by making healthy lifestyle changes, such as eating healthy foods, exercising more, and reducing your alcohol intake. You may be referred for counseling on a healthy diet and physical activity. Your health care provider may prescribe medicine if lifestyle changes are not enough to get your blood pressure under control and if: Your systolic blood pressure is above 130. Your diastolic blood pressure is above 80. Your personal target blood pressure may vary depending on your medical conditions, your age, and other factors. Follow these instructions at home: Eating and drinking  Eat a diet that is high in fiber and potassium, and low in sodium, added sugar, and fat. An example of this eating plan is called the DASH diet. DASH stands for Dietary Approaches to Stop Hypertension. To eat this way: Eat   plenty of fresh fruits and vegetables. Try to fill one half of your plate at each meal with fruits and vegetables. Eat whole grains, such as whole-wheat pasta, brown rice, or whole-grain bread. Fill about one  fourth of your plate with whole grains. Eat or drink low-fat dairy products, such as skim milk or low-fat yogurt. Avoid fatty cuts of meat, processed or cured meats, and poultry with skin. Fill about one fourth of your plate with lean proteins, such as fish, chicken without skin, beans, eggs, or tofu. Avoid pre-made and processed foods. These tend to be higher in sodium, added sugar, and fat. Reduce your daily sodium intake. Many people with hypertension should eat less than 1,500 mg of sodium a day. Do not drink alcohol if: Your health care provider tells you not to drink. You are pregnant, may be pregnant, or are planning to become pregnant. If you drink alcohol: Limit how much you have to: 0-1 drink a day for women. 0-2 drinks a day for men. Know how much alcohol is in your drink. In the [place] one drink equals one 12 oz bottle of beer (355 mL), one 5 oz glass of wine (148 mL), or one 1 oz glass of hard liquor (44 mL). Lifestyle  Work with your health care provider to maintain a healthy body weight or to lose weight. Ask what an ideal weight is for you. Get at least 30 minutes of exercise that causes your heart to beat faster (aerobic exercise) most days of the week. Activities may include walking, swimming, or biking. Include exercise to strengthen your muscles (resistance exercise), such as Pilates or lifting weights, as part of your weekly exercise routine. Try to do these types of exercises for 30 minutes at least 3 days a week. Do not use any products that contain nicotine or tobacco. These products include cigarettes, chewing tobacco, and vaping devices, such as e-cigarettes. If you need help quitting, ask your health care provider. Monitor your blood pressure at home as told by your health care provider. Keep all follow-up visits. This is important. Medicines Take over-the-counter and prescription medicines only as told by your health care provider. Follow directions carefully. Blood  pressure medicines must be taken as prescribed. Do not skip doses of blood pressure medicine. Doing this puts you at risk for problems and can make the medicine less effective. Ask your health care provider about side effects or reactions to medicines that you should watch for. Contact a health care provider if you: Think you are having a reaction to a medicine you are taking. Have headaches that keep coming back (recurring). Feel dizzy. Have swelling in your ankles. Have trouble with your vision. Get help right away if you: Develop a severe headache or confusion. Have unusual weakness or numbness. Feel faint. Have severe pain in your chest or abdomen. Vomit repeatedly. Have trouble breathing. These symptoms may be an emergency. Get help right away. Call 911. Do not wait to see if the symptoms will go away. Do not drive yourself to the hospital. Summary Hypertension is when the force of blood pumping through your arteries is too strong. If this condition is not controlled, it may put you at risk for serious complications. Your personal target blood pressure may vary depending on your medical conditions, your age, and other factors. For most people, a normal blood pressure is less than 120/80. Hypertension is treated with lifestyle changes, medicines, or a combination of both. Lifestyle changes include losing weight, eating a healthy,   low-sodium diet, exercising more, and limiting alcohol. This information is not intended to replace advice given to you by your health care provider. Make sure you discuss any questions you have with your health care provider. Document Revised: 01/27/2021 Document Reviewed: 01/27/2021 Elsevier Patient Education  2024 Elsevier Inc.  

## 2022-09-09 NOTE — Progress Notes (Signed)
Subjective:    Patient ID: Ernest Miller, male    DOB: 1975/06/06, 47 y.o.   MRN: [id number]  Chief Complaint  Patient presents with   Hypertension   Diabetes    1 month to recheck DM and HTN   PT presents to the office today to follow up on DM, HTN. He was seen on 07/29/22 and had not been taking any of his medications. We restarted his losartan 25 mg, Crestor 5 mg, and Mounjaro 2.5 mg but states he has not started these, except his Mounjaro 2.5 mg.    His BP is not at goal.  Hypertension This is a chronic problem. The current episode started more than 1 year ago. The problem is unchanged. The problem is controlled. Associated symptoms include anxiety and malaise/fatigue. Pertinent negatives include no blurred vision, peripheral edema or shortness of breath. Risk factors for coronary artery disease include dyslipidemia, diabetes mellitus, male gender, obesity and sedentary lifestyle. Past treatments include nothing. The current treatment provides no improvement.  Diabetes He presents for his follow-up diabetic visit. He has type 2 diabetes mellitus. Hypoglycemia symptoms include nervousness/anxiousness. Pertinent negatives for diabetes include no blurred vision and no foot paresthesias. Symptoms are stable. Risk factors for coronary artery disease include dyslipidemia, diabetes mellitus, hypertension, male sex and sedentary lifestyle. He is following a generally unhealthy diet. His overall blood glucose range is 90-110 mg/dl. Eye exam is not current.  Hyperlipidemia This is a chronic problem. The current episode started more than 1 year ago. Exacerbating diseases include obesity. Pertinent negatives include no shortness of breath. Current antihyperlipidemic treatment includes diet change. The current treatment provides no improvement of lipids.  Anxiety Presents for follow-up visit. Symptoms include excessive worry, nervous/anxious behavior and restlessness. Patient reports no shortness of  breath. Symptoms occur most days. The severity of symptoms is mild.        Review of Systems  Constitutional:  Positive for malaise/fatigue.  Eyes:  Negative for blurred vision.  Respiratory:  Negative for shortness of breath.   Psychiatric/Behavioral:  The patient is nervous/anxious.   All other systems reviewed and are negative.      Objective:   Physical Exam Vitals reviewed.  Constitutional:      General: He is not in acute distress.    Appearance: He is well-developed. He is obese.  HENT:     Head: Normocephalic.     Right Ear: Tympanic membrane normal.     Left Ear: Tympanic membrane normal.  Eyes:     General:        Right eye: No discharge.        Left eye: No discharge.     Pupils: Pupils are equal, round, and reactive to light.  Neck:     Thyroid: No thyromegaly.  Cardiovascular:     Rate and Rhythm: Normal rate and regular rhythm.     Heart sounds: Normal heart sounds. No murmur heard. Pulmonary:     Effort: Pulmonary effort is normal. No respiratory distress.     Breath sounds: Normal breath sounds. No wheezing.  Abdominal:     General: Bowel sounds are normal. There is no distension.     Palpations: Abdomen is soft.     Tenderness: There is no abdominal tenderness.  Musculoskeletal:        General: No tenderness. Normal range of motion.     Cervical back: Normal range of motion and neck supple.  Skin:    General: Skin is warm and  dry.     Findings: No erythema or rash.  Neurological:     Mental Status: He is alert and oriented to person, place, and time.     Cranial Nerves: No cranial nerve deficit.     Deep Tendon Reflexes: Reflexes are normal and symmetric.  Psychiatric:        Behavior: Behavior normal.        Thought Content: Thought content normal.        Judgment: Judgment normal.       BP (!) 155/79   Pulse 70   Temp 98 F (36.7 C) (Temporal)   Ht 5\' 11"  (1.803 m)   Wt 284 lb (128.8 kg)   SpO2 98%   BMI 39.61 kg/m       Assessment & Plan:   KIMANI TITONE comes in today with chief complaint of Hypertension and Diabetes (1 month to recheck DM and HTN)   Diagnosis and orders addressed:  1. Type 2 diabetes mellitus with other specified complication, without long-term current use of insulin (HCC)  2. GAD (generalized anxiety disorder)  3. Hyperlipidemia associated with type 2 diabetes mellitus (HCC)  4. Morbid obesity (HCC)  5. Colon cancer screening - Ambulatory referral to Gastroenterology   Labs reviewed Start medications  Strict low carb Stress management  Health Maintenance reviewed Diet and exercise encouraged  Follow up plan: 3 months   Jannifer Rodney, FNP

## 2022-09-20 ENCOUNTER — Ambulatory Visit (INDEPENDENT_AMBULATORY_CARE_PROVIDER_SITE_OTHER): Payer: 59 | Admitting: Nurse Practitioner

## 2022-09-20 ENCOUNTER — Ambulatory Visit (INDEPENDENT_AMBULATORY_CARE_PROVIDER_SITE_OTHER): Payer: 59

## 2022-09-20 ENCOUNTER — Encounter: Payer: Self-pay | Admitting: Nurse Practitioner

## 2022-09-20 VITALS — BP 141/84 | HR 55 | Temp 97.6°F | Resp 20 | Ht 71.0 in | Wt 286.0 lb

## 2022-09-20 DIAGNOSIS — M25561 Pain in right knee: Secondary | ICD-10-CM | POA: Diagnosis not present

## 2022-09-20 MED ORDER — PREDNISONE 10 MG (21) PO TBPK: ORAL_TABLET | ORAL | 0 refills | Status: DC

## 2022-09-20 NOTE — Progress Notes (Signed)
Subjective:    Patient ID: Ernest Miller, male    DOB: 08/05/1975, 47 y.o.   MRN: [id number]   Chief Complaint: Knee Pain   Knee Pain  The incident occurred 2 days ago. Incident location: at a buddy's garage lifting a transmission. The injury mechanism is unknown. The pain is present in the right knee. The quality of the pain is described as aching. The pain is at a severity of 8/10. The pain is moderate. The pain has been Intermittent since onset. Associated symptoms include an inability to bear weight and a loss of motion. He reports no foreign bodies present. The symptoms are aggravated by movement and weight bearing. He has tried nothing for the symptoms. The treatment provided no relief.    Patient Active Problem List   Diagnosis Date Noted   Gastroesophageal reflux disease 07/29/2022   Chest pain 08/20/2019   Hypertension 06/05/2019   Kidney stone 12/26/2017   S/P laparoscopic cholecystectomy 11/24/2017   Diabetes mellitus (HCC) 02/16/2017   Hyperlipidemia associated with type 2 diabetes mellitus (HCC) 02/16/2017   Morbid obesity (HCC) 02/16/2017   GAD (generalized anxiety disorder) 05/25/2016   SOB (shortness of breath) 05/25/2016       Review of Systems  Constitutional:  Negative for diaphoresis.  Eyes:  Negative for pain.  Respiratory:  Negative for shortness of breath.   Cardiovascular:  Negative for chest pain, palpitations and leg swelling.  Gastrointestinal:  Negative for abdominal pain.  Endocrine: Negative for polydipsia.  Musculoskeletal:  Positive for arthralgias (right knee).  Skin:  Negative for rash.  Neurological:  Negative for dizziness, weakness and headaches.  Hematological:  Does not bruise/bleed easily.  All other systems reviewed and are negative.      Objective:   Physical Exam Constitutional:      Appearance: Normal appearance.  Cardiovascular:     Rate and Rhythm: Normal rate and regular rhythm.     Heart sounds: Normal heart sounds.   Pulmonary:     Effort: Pulmonary effort is normal.     Breath sounds: Normal breath sounds.  Musculoskeletal:     Comments: FROM of right knee with pain on flexion and extension Mild effusion All ligaments intact  Neurological:     General: No focal deficit present.     Mental Status: He is alert and oriented to person, place, and time.  Psychiatric:        Mood and Affect: Mood normal.        Behavior: Behavior normal.    BP (!) 141/84   Pulse (!) 55   Temp 97.6 F (36.4 C) (Temporal)   Resp 20   Ht 5\' 11"  (1.803 m)   Wt 286 lb (129.7 kg)   SpO2 100%   BMI 39.89 kg/m       Assessment & Plan:   [name] in today with chief complaint of Knee Pain   1. Acute pain of right knee Ice Elevated Rest RTO prn - DG Knee 1-2 Views Right - predniSONE (STERAPRED UNI-PAK 21 TAB) 10 MG (21) TBPK tablet; As directed x 6 days  Dispense: 21 tablet; Refill: 0    The above assessment and management plan was discussed with the patient. The patient verbalized understanding of and has agreed to the management plan. Patient is aware to call the clinic if symptoms persist or worsen. Patient is aware when to return to the clinic for a follow-up visit. Patient educated on when it is appropriate to go  to the emergency department.   [place]-Margaret Hassell Done, FNP

## 2022-09-20 NOTE — Patient Instructions (Signed)
Acute Knee Pain, Adult Many things can cause knee pain. Sometimes, knee pain is sudden (acute) and may be caused by damage, swelling, or irritation of the muscles and tissues that support your knee. The pain often goes away on its own with time and rest. If the pain does not go away, tests may be done to find out what is causing the pain. Follow these instructions at home: If you have a knee sleeve or brace:  Wear the knee sleeve or brace as told by your doctor. Take it off only as told by your doctor. Loosen it if your toes: Tingle. Become numb. Turn cold and blue. Keep it clean. If the knee sleeve or brace is not waterproof: Do not let it get wet. Cover it with a watertight covering when you take a bath or shower. Activity Rest your knee. Do not do things that cause pain or make pain worse. Avoid activities where both feet leave the ground at the same time (high-impact activities). Examples are running, jumping rope, and doing jumping jacks. Work with a physical therapist to make a safe exercise program, as told by your doctor. Managing pain, stiffness, and swelling  If told, put ice on the knee. To do this: If you have a removable knee sleeve or brace, take it off as told by your doctor. Put ice in a plastic bag. Place a towel between your skin and the bag. Leave the ice on for 20 minutes, 2-3 times a day. Take off the ice if your skin turns bright red. This is very important. If you cannot feel pain, heat, or cold, you have a greater risk of damage to the area. If told, use an elastic bandage to put pressure (compression) on your injured knee. Raise your knee above the level of your heart while you are sitting or lying down. Sleep with a pillow under your knee. General instructions Take over-the-counter and prescription medicines only as told by your doctor. Do not smoke or use any products that contain nicotine or tobacco. If you need help quitting, ask your doctor. If you are  overweight, work with your doctor and a food expert (dietitian) to set goals to lose weight. Being overweight can make your knee hurt more. Watch for any changes in your symptoms. Keep all follow-up visits. Contact a doctor if: The knee pain does not stop. The knee pain changes or gets worse. You have a fever along with knee pain. Your knee is red or feels warm when you touch it. Your knee gives out or locks up. Get help right away if: Your knee swells, and the swelling gets worse. You cannot move your knee. You have very bad knee pain that does not get better with pain medicine. Summary Many things can cause knee pain. The pain often goes away on its own with time and rest. Your doctor may do tests to find out the cause of the pain. Watch for any changes in your symptoms. Relieve your pain with rest, medicines, light activity, and use of ice. Get help right away if you cannot move your knee or your knee pain is very bad. This information is not intended to replace advice given to you by your health care provider. Make sure you discuss any questions you have with your health care provider. Document Revised: 09/04/2019 Document Reviewed: 09/05/2019 Elsevier Patient Education  2024 [employer].

## 2022-09-21 ENCOUNTER — Ambulatory Visit: Payer: 59

## 2022-09-30 DIAGNOSIS — K219 Gastro-esophageal reflux disease without esophagitis: Secondary | ICD-10-CM | POA: Diagnosis not present

## 2022-09-30 DIAGNOSIS — E119 Type 2 diabetes mellitus without complications: Secondary | ICD-10-CM | POA: Diagnosis not present

## 2022-09-30 DIAGNOSIS — Z7985 Long-term (current) use of injectable non-insulin antidiabetic drugs: Secondary | ICD-10-CM | POA: Diagnosis not present

## 2022-09-30 DIAGNOSIS — Z6838 Body mass index (BMI) 38.0-38.9, adult: Secondary | ICD-10-CM | POA: Diagnosis not present

## 2022-09-30 DIAGNOSIS — E669 Obesity, unspecified: Secondary | ICD-10-CM | POA: Diagnosis not present

## 2022-11-13 ENCOUNTER — Other Ambulatory Visit: Payer: Self-pay | Admitting: Family

## 2022-11-13 DIAGNOSIS — E1169 Type 2 diabetes mellitus with other specified complication: Secondary | ICD-10-CM

## 2022-11-30 ENCOUNTER — Encounter: Payer: Self-pay | Admitting: *Deleted

## 2022-12-03 ENCOUNTER — Other Ambulatory Visit: Payer: Self-pay | Admitting: Family

## 2022-12-09 ENCOUNTER — Encounter: Payer: Self-pay | Admitting: Family

## 2022-12-09 ENCOUNTER — Ambulatory Visit (INDEPENDENT_AMBULATORY_CARE_PROVIDER_SITE_OTHER): Payer: 59 | Admitting: Family

## 2022-12-09 ENCOUNTER — Ambulatory Visit (INDEPENDENT_AMBULATORY_CARE_PROVIDER_SITE_OTHER): Payer: 59

## 2022-12-09 VITALS — BP 124/77 | HR 76 | Temp 98.1°F | Ht 71.0 in | Wt 296.2 lb

## 2022-12-09 DIAGNOSIS — B351 Tinea unguium: Secondary | ICD-10-CM

## 2022-12-09 DIAGNOSIS — Z1211 Encounter for screening for malignant neoplasm of colon: Secondary | ICD-10-CM

## 2022-12-09 DIAGNOSIS — E1169 Type 2 diabetes mellitus with other specified complication: Secondary | ICD-10-CM | POA: Diagnosis not present

## 2022-12-09 DIAGNOSIS — E785 Hyperlipidemia, unspecified: Secondary | ICD-10-CM | POA: Diagnosis not present

## 2022-12-09 DIAGNOSIS — T466X5D Adverse effect of antihyperlipidemic and antiarteriosclerotic drugs, subsequent encounter: Secondary | ICD-10-CM

## 2022-12-09 DIAGNOSIS — F411 Generalized anxiety disorder: Secondary | ICD-10-CM

## 2022-12-09 DIAGNOSIS — Z7985 Long-term (current) use of injectable non-insulin antidiabetic drugs: Secondary | ICD-10-CM

## 2022-12-09 DIAGNOSIS — T466X5A Adverse effect of antihyperlipidemic and antiarteriosclerotic drugs, initial encounter: Secondary | ICD-10-CM | POA: Diagnosis not present

## 2022-12-09 DIAGNOSIS — G720 Drug-induced myopathy: Secondary | ICD-10-CM

## 2022-12-09 DIAGNOSIS — K219 Gastro-esophageal reflux disease without esophagitis: Secondary | ICD-10-CM | POA: Diagnosis not present

## 2022-12-09 DIAGNOSIS — I10 Essential (primary) hypertension: Secondary | ICD-10-CM | POA: Diagnosis not present

## 2022-12-09 LAB — HM DIABETES EYE EXAM

## 2022-12-09 LAB — BAYER DCA HB A1C WAIVED: HB A1C (BAYER DCA - WAIVED): 6 % — ABNORMAL HIGH (ref 4.8–5.6)

## 2022-12-09 MED ORDER — MOUNJARO 2.5 MG/0.5ML ~~LOC~~ SOAJ: 2.5000 mg | SUBCUTANEOUS | 3 refills | Status: DC

## 2022-12-09 MED ORDER — TERBINAFINE HCL 250 MG PO TABS: 250.0000 mg | ORAL_TABLET | Freq: Every day | ORAL | 0 refills | Status: DC

## 2022-12-09 NOTE — Progress Notes (Signed)
Subjective:    Patient ID: Ernest Miller, male    DOB: 12-31-1975, 47 y.o.   MRN: [id number]  Chief Complaint  Patient presents with   Medical Management of Chronic Issues   PT presents to the office today for chronic follow up.   He is taking his Mounjaro 2.5 mg, but has been out for the last two weeks. He has not taken any other medications except his Prilosec 40 mg.   States the statins cause him myopathy.   He is complaining of bilateral great toe nails thick and discolored. We sent terbinafine rx, but he never picked it up. Wants another rx sent.  Hypertension This is a chronic problem. The current episode started more than 1 year ago. The problem has been resolved since onset. The problem is controlled. Associated symptoms include anxiety and blurred vision. Pertinent negatives include no malaise/fatigue, peripheral edema or shortness of breath. Risk factors for coronary artery disease include dyslipidemia, diabetes mellitus, obesity and male gender. The current treatment provides moderate improvement.  Gastroesophageal Reflux He complains of belching, heartburn and a hoarse voice. This is a chronic problem. The current episode started more than 1 year ago. The problem occurs occasionally. Risk factors include obesity. He has tried a PPI for the symptoms. The treatment provided moderate relief.  Hyperlipidemia This is a chronic problem. The current episode started more than 1 year ago. The problem is uncontrolled. Recent lipid tests were reviewed and are high. Exacerbating diseases include obesity. Pertinent negatives include no shortness of breath. Current antihyperlipidemic treatment includes diet change. The current treatment provides no improvement of lipids. Risk factors for coronary artery disease include dyslipidemia, hypertension, diabetes mellitus, male sex and a sedentary lifestyle.  Diabetes He presents for his follow-up diabetic visit. He has type 2 diabetes mellitus.  Hypoglycemia symptoms include nervousness/anxiousness. Associated symptoms include blurred vision. Pertinent negatives for diabetes include no foot paresthesias. Symptoms are stable. Risk factors for coronary artery disease include dyslipidemia, diabetes mellitus, hypertension, sedentary lifestyle and post-menopausal. He is following a generally unhealthy diet. His overall blood glucose range is 130-140 mg/dl. Eye exam is not current.  Anxiety Presents for follow-up visit. Symptoms include excessive worry, nervous/anxious behavior and obsessions. Patient reports no shortness of breath. Symptoms occur occasionally. The severity of symptoms is moderate.        Review of Systems  Constitutional:  Negative for malaise/fatigue.  HENT:  Positive for hoarse voice.   Eyes:  Positive for blurred vision.  Respiratory:  Negative for shortness of breath.   Gastrointestinal:  Positive for heartburn.  Psychiatric/Behavioral:  The patient is nervous/anxious.   All other systems reviewed and are negative.      Objective:   Physical Exam Vitals reviewed.  Constitutional:      General: He is not in acute distress.    Appearance: He is well-developed. He is obese.  HENT:     Head: Normocephalic.     Right Ear: Tympanic membrane normal.     Left Ear: Tympanic membrane normal.  Eyes:     General:        Right eye: No discharge.        Left eye: No discharge.     Pupils: Pupils are equal, round, and reactive to light.  Neck:     Thyroid: No thyromegaly.  Cardiovascular:     Rate and Rhythm: Normal rate and regular rhythm.     Heart sounds: Normal heart sounds. No murmur heard. Pulmonary:  Effort: Pulmonary effort is normal. No respiratory distress.     Breath sounds: Normal breath sounds. No wheezing.  Abdominal:     General: Bowel sounds are normal. There is no distension.     Palpations: Abdomen is soft.     Tenderness: There is no abdominal tenderness.  Musculoskeletal:         General: No tenderness. Normal range of motion.     Cervical back: Normal range of motion and neck supple.  Skin:    General: Skin is warm and dry.     Findings: No erythema or rash.     Comments: Bilateral great toe nails thick and discolored.   Neurological:     Mental Status: He is alert and oriented to person, place, and time.     Cranial Nerves: No cranial nerve deficit.     Deep Tendon Reflexes: Reflexes are normal and symmetric.  Psychiatric:        Behavior: Behavior normal.        Thought Content: Thought content normal.        Judgment: Judgment normal.       BP 124/77   Pulse 76   Temp 98.1 F (36.7 C) (Temporal)   Ht 5\' 11"  (1.803 m)   Wt 296 lb 3.2 oz (134.4 kg)   SpO2 92%   BMI 41.31 kg/m      Assessment & Plan:  Ernest Miller comes in today with chief complaint of Medical Management of Chronic Issues   Diagnosis and orders addressed:  1. Onychomycosis - terbinafine (LAMISIL) 250 MG tablet; Take 1 tablet (250 mg total) by mouth daily.  Dispense: 90 tablet; Refill: 0 - CMP14+EGFR  2. Type 2 diabetes mellitus with other specified complication, without long-term current use of insulin (HCC) - tirzepatide (MOUNJARO) 2.5 MG/0.5ML Pen; Inject 2.5 mg into the skin once a week.  Dispense: 6 mL; Refill: 3 - Bayer DCA Hb A1c Waived - CMP14+EGFR  3. GAD (generalized anxiety disorder) - CMP14+EGFR  4. Gastroesophageal reflux disease without esophagitis - CMP14+EGFR  5. Hyperlipidemia associated with type 2 diabetes mellitus (HCC) - CMP14+EGFR  6. Primary hypertension - CMP14+EGFR  7. Morbid obesity (HCC) - CMP14+EGFR  8. Statin myopathy - CMP14+EGFR  9. Colon cancer screening - Ambulatory referral to Gastroenterology   Labs pending Continue current medications  Health Maintenance reviewed Diet and exercise encouraged  Follow up plan: 4 months   Jannifer Rodney, FNP

## 2022-12-09 NOTE — Progress Notes (Signed)
Ernest Miller arrived 12/09/2022 and has given verbal consent to obtain images and complete their overdue diabetic retinal screening.  The images have been sent to an ophthalmologist or optometrist for review and interpretation.  Results will be sent back to [name], FNP for review.  Patient has been informed they will be contacted when we receive the results via telephone or MyChart

## 2022-12-09 NOTE — Patient Instructions (Signed)

## 2022-12-10 LAB — CMP14+EGFR
ALT: 26 IU/L (ref 0–44)
AST: 26 IU/L (ref 0–40)
Albumin: 4.4 g/dL (ref 4.1–5.1)
Alkaline Phosphatase: 76 IU/L (ref 44–121)
BUN/Creatinine Ratio: 16 (ref 9–20)
BUN: 22 mg/dL (ref 6–24)
Bilirubin Total: 0.2 mg/dL (ref 0.0–1.2)
CO2: 22 mmol/L (ref 20–29)
Calcium: 9.8 mg/dL (ref 8.7–10.2)
Chloride: 103 mmol/L (ref 96–106)
Creatinine, Ser: 1.36 mg/dL — ABNORMAL HIGH (ref 0.76–1.27)
Globulin, Total: 2.6 g/dL (ref 1.5–4.5)
Glucose: 95 mg/dL (ref 70–99)
Potassium: 4.8 mmol/L (ref 3.5–5.2)
Sodium: 140 mmol/L (ref 134–144)
Total Protein: 7 g/dL (ref 6.0–8.5)
eGFR: 64 mL/min/{1.73_m2} (ref >59)

## 2022-12-13 ENCOUNTER — Encounter (INDEPENDENT_AMBULATORY_CARE_PROVIDER_SITE_OTHER): Payer: Self-pay | Admitting: *Deleted

## 2023-01-10 ENCOUNTER — Other Ambulatory Visit: Payer: Self-pay | Admitting: Family

## 2023-01-10 MED ORDER — ACCU-CHEK GUIDE VI STRP: ORAL_STRIP | 3 refills | Status: DC

## 2023-01-10 NOTE — Addendum Note (Signed)
Addended by: Julious Payer D on: 01/10/2023 11:44 AM   Modules accepted: Orders

## 2023-02-09 ENCOUNTER — Other Ambulatory Visit: Payer: Self-pay | Admitting: Family

## 2023-02-11 ENCOUNTER — Other Ambulatory Visit: Payer: Self-pay | Admitting: Family

## 2023-02-15 ENCOUNTER — Telehealth: Payer: Self-pay | Admitting: Family

## 2023-02-15 DIAGNOSIS — E1169 Type 2 diabetes mellitus with other specified complication: Secondary | ICD-10-CM

## 2023-02-15 MED ORDER — MOUNJARO 2.5 MG/0.5ML ~~LOC~~ SOAJ: 2.5000 mg | SUBCUTANEOUS | 3 refills | Status: DC

## 2023-02-15 NOTE — Telephone Encounter (Signed)
Refill sent.

## 2023-03-15 ENCOUNTER — Encounter (INDEPENDENT_AMBULATORY_CARE_PROVIDER_SITE_OTHER): Payer: Self-pay | Admitting: *Deleted

## 2023-04-27 ENCOUNTER — Telehealth: Payer: Self-pay | Admitting: Cardiology

## 2023-04-27 NOTE — Telephone Encounter (Signed)
Called pt in regards to recall for Dr. Wyline Mood- pt stated he did not want to make an apt at this time and did not want to be seen.   Recall deleted

## 2023-04-28 ENCOUNTER — Ambulatory Visit (INDEPENDENT_AMBULATORY_CARE_PROVIDER_SITE_OTHER): Payer: 59 | Admitting: Family

## 2023-04-28 ENCOUNTER — Other Ambulatory Visit: Payer: Self-pay | Admitting: Family

## 2023-04-28 ENCOUNTER — Encounter: Payer: Self-pay | Admitting: Family

## 2023-04-28 VITALS — BP 136/74 | HR 88 | Temp 97.8°F | Wt 292.0 lb

## 2023-04-28 DIAGNOSIS — E785 Hyperlipidemia, unspecified: Secondary | ICD-10-CM | POA: Diagnosis not present

## 2023-04-28 DIAGNOSIS — F411 Generalized anxiety disorder: Secondary | ICD-10-CM | POA: Diagnosis not present

## 2023-04-28 DIAGNOSIS — Z7985 Long-term (current) use of injectable non-insulin antidiabetic drugs: Secondary | ICD-10-CM

## 2023-04-28 DIAGNOSIS — Z1211 Encounter for screening for malignant neoplasm of colon: Secondary | ICD-10-CM

## 2023-04-28 DIAGNOSIS — N529 Male erectile dysfunction, unspecified: Secondary | ICD-10-CM | POA: Diagnosis not present

## 2023-04-28 DIAGNOSIS — T466X5A Adverse effect of antihyperlipidemic and antiarteriosclerotic drugs, initial encounter: Secondary | ICD-10-CM | POA: Diagnosis not present

## 2023-04-28 DIAGNOSIS — E1169 Type 2 diabetes mellitus with other specified complication: Secondary | ICD-10-CM

## 2023-04-28 DIAGNOSIS — I10 Essential (primary) hypertension: Secondary | ICD-10-CM | POA: Diagnosis not present

## 2023-04-28 DIAGNOSIS — T466X5D Adverse effect of antihyperlipidemic and antiarteriosclerotic drugs, subsequent encounter: Secondary | ICD-10-CM

## 2023-04-28 DIAGNOSIS — G720 Drug-induced myopathy: Secondary | ICD-10-CM | POA: Diagnosis not present

## 2023-04-28 DIAGNOSIS — K219 Gastro-esophageal reflux disease without esophagitis: Secondary | ICD-10-CM

## 2023-04-28 LAB — BAYER DCA HB A1C WAIVED: HB A1C (BAYER DCA - WAIVED): 5.5 % (ref 4.8–5.6)

## 2023-04-28 MED ORDER — SILDENAFIL CITRATE 100 MG PO TABS: 50.0000 mg | ORAL_TABLET | Freq: Every day | ORAL | 2 refills | Status: DC | PRN

## 2023-04-28 MED ORDER — TIRZEPATIDE 5 MG/0.5ML ~~LOC~~ SOAJ: 5.0000 mg | SUBCUTANEOUS | 3 refills | Status: DC

## 2023-04-28 MED ORDER — OMEPRAZOLE 40 MG PO CPDR: 40.0000 mg | DELAYED_RELEASE_CAPSULE | Freq: Every day | ORAL | 3 refills | Status: DC

## 2023-04-28 NOTE — Patient Instructions (Signed)

## 2023-04-28 NOTE — Progress Notes (Signed)
Subjective:    Patient ID: Ernest Miller, male    DOB: 1975-07-25, 48 y.o.   MRN: [id number]  Chief Complaint  Patient presents with   Medical Management of Chronic Issues   PT presents to the office today for chronic follow up.   He is taking his Mounjaro 2.5 mg and tolerating well.   States the statins cause him myopathy.   He reports he is having a hard time maintaining erection.   Hypertension This is a chronic problem. The current episode started more than 1 year ago. The problem has been waxing and waning since onset. The problem is uncontrolled. Associated symptoms include anxiety, blurred vision and malaise/fatigue. Pertinent negatives include no peripheral edema or shortness of breath. Risk factors for coronary artery disease include dyslipidemia, diabetes mellitus, obesity and male gender. Past treatments include nothing. The current treatment provides no improvement.  Gastroesophageal Reflux He complains of belching, heartburn and a hoarse voice. This is a chronic problem. The current episode started more than 1 year ago. The problem occurs occasionally. Risk factors include obesity. He has tried a PPI for the symptoms. The treatment provided moderate relief.  Hyperlipidemia This is a chronic problem. The current episode started more than 1 year ago. The problem is uncontrolled. Recent lipid tests were reviewed and are high. Exacerbating diseases include obesity. Pertinent negatives include no shortness of breath. Current antihyperlipidemic treatment includes diet change. The current treatment provides no improvement of lipids. Risk factors for coronary artery disease include dyslipidemia, hypertension, diabetes mellitus, male sex and a sedentary lifestyle.  Diabetes He presents for his follow-up diabetic visit. He has type 2 diabetes mellitus. Hypoglycemia symptoms include nervousness/anxiousness. Associated symptoms include blurred vision. Pertinent negatives for diabetes  include no foot paresthesias. Symptoms are stable. Risk factors for coronary artery disease include dyslipidemia, diabetes mellitus, hypertension, sedentary lifestyle and post-menopausal. He is following a generally unhealthy diet. His overall blood glucose range is 130-140 mg/dl. (Not checking glucose at home) Eye exam is not current.  Anxiety Presents for follow-up visit. Symptoms include excessive worry, nervous/anxious behavior and obsessions. Patient reports no shortness of breath. Symptoms occur occasionally. The severity of symptoms is moderate.        Review of Systems  Constitutional:  Positive for malaise/fatigue.  HENT:  Positive for hoarse voice.   Eyes:  Positive for blurred vision.  Respiratory:  Negative for shortness of breath.   Gastrointestinal:  Positive for heartburn.  Psychiatric/Behavioral:  The patient is nervous/anxious.   All other systems reviewed and are negative.      Objective:   Physical Exam Vitals reviewed.  Constitutional:      General: He is not in acute distress.    Appearance: He is well-developed. He is obese.  HENT:     Head: Normocephalic.     Right Ear: Tympanic membrane normal.     Left Ear: Tympanic membrane normal.  Eyes:     General:        Right eye: No discharge.        Left eye: No discharge.     Pupils: Pupils are equal, round, and reactive to light.  Neck:     Thyroid: No thyromegaly.  Cardiovascular:     Rate and Rhythm: Normal rate and regular rhythm.     Heart sounds: Normal heart sounds. No murmur heard. Pulmonary:     Effort: Pulmonary effort is normal. No respiratory distress.     Breath sounds: Normal breath sounds. No wheezing.  Abdominal:     General: Bowel sounds are normal. There is no distension.     Palpations: Abdomen is soft.     Tenderness: There is no abdominal tenderness.  Musculoskeletal:        General: No tenderness. Normal range of motion.     Cervical back: Normal range of motion and neck supple.   Skin:    General: Skin is warm and dry.     Findings: No erythema or rash.     Comments: Bilateral great toe nails thick and discolored.   Neurological:     Mental Status: He is alert and oriented to person, place, and time.     Cranial Nerves: No cranial nerve deficit.     Deep Tendon Reflexes: Reflexes are normal and symmetric.  Psychiatric:        Behavior: Behavior normal.        Thought Content: Thought content normal.        Judgment: Judgment normal.       BP 136/74 Comment: pt reports at home  Pulse 88   Temp 97.8 F (36.6 C) (Temporal)   Wt 292 lb (132.5 kg)   SpO2 98%   BMI 40.73 kg/m      Assessment & Plan:  JAHQUEL PLESE comes in today with chief complaint of Medical Management of Chronic Issues   Diagnosis and orders addressed: 1. Gastroesophageal reflux disease - omeprazole (PRILOSEC) 40 MG capsule; Take 1 capsule (40 mg total) by mouth daily.  Dispense: 90 capsule; Refill: 3 - CMP14+EGFR  2. Type 2 diabetes mellitus with other specified complication, without long-term current use of insulin (HCC) (Primary) - tirzepatide (MOUNJARO) 5 MG/0.5ML Pen; Inject 5 mg into the skin once a week.  Dispense: 6 mL; Refill: 3 - CMP14+EGFR - Bayer DCA Hb A1c Waived  3. GAD (generalized anxiety disorder) - CMP14+EGFR  4. Hyperlipidemia associated with type 2 diabetes mellitus (HCC) - CMP14+EGFR  5. Primary hypertension - CMP14+EGFR  6. Morbid obesity (HCC) - CMP14+EGFR  7. Statin myopathy - CMP14+EGFR  8. Colon cancer screening - Ambulatory referral to Gastroenterology - CMP14+EGFR  9. Erectile dysfunction, unspecified erectile dysfunction type Will start viagra as needed Good rx coupon given  - sildenafil (VIAGRA) 100 MG tablet; Take 0.5-1 tablets (50-100 mg total) by mouth daily as needed for erectile dysfunction.  Dispense: 30 tablet; Refill: 2 - CMP14+EGFR  Labs pending Will increase Mounjaro to 5 mg from 2.5 mg  Viagra rx given today.   Continue current medications  Health Maintenance reviewed Diet and exercise encouraged  Follow up plan: 4 months   Jannifer Rodney, FNP

## 2023-04-29 LAB — CMP14+EGFR
ALT: 22 [IU]/L (ref 0–44)
AST: 21 [IU]/L (ref 0–40)
Albumin: 4.3 g/dL (ref 4.1–5.1)
Alkaline Phosphatase: 91 [IU]/L (ref 44–121)
BUN/Creatinine Ratio: 16 (ref 9–20)
BUN: 20 mg/dL (ref 6–24)
Bilirubin Total: 0.3 mg/dL (ref 0.0–1.2)
CO2: 23 mmol/L (ref 20–29)
Calcium: 9.6 mg/dL (ref 8.7–10.2)
Chloride: 103 mmol/L (ref 96–106)
Creatinine, Ser: 1.28 mg/dL — ABNORMAL HIGH (ref 0.76–1.27)
Globulin, Total: 2.8 g/dL (ref 1.5–4.5)
Glucose: 111 mg/dL — ABNORMAL HIGH (ref 70–99)
Potassium: 4 mmol/L (ref 3.5–5.2)
Sodium: 142 mmol/L (ref 134–144)
Total Protein: 7.1 g/dL (ref 6.0–8.5)
eGFR: 69 mL/min/{1.73_m2} (ref >59)

## 2023-04-29 NOTE — Telephone Encounter (Signed)
VICTOZA 18 MG/3ML SOPN        Changed from: tirzepatide Greggory Keen) 5 MG/0.5ML Pen    Pharmacy comment: Alternative Requested:NON FORMULARY MEDICATION.   All Pharmacy Suggested Alternatives:  liraglutide (VICTOZA) 18 MG/3ML SOPN Dulaglutide (TRULICITY) 0.75 MG/0.5ML SOAJ liraglutide (VICTOZA) 18 MG/3ML SOPN

## 2023-06-14 ENCOUNTER — Encounter (INDEPENDENT_AMBULATORY_CARE_PROVIDER_SITE_OTHER): Payer: Self-pay | Admitting: *Deleted

## 2023-07-14 ENCOUNTER — Telehealth: Payer: Self-pay

## 2023-07-14 DIAGNOSIS — E1169 Type 2 diabetes mellitus with other specified complication: Secondary | ICD-10-CM

## 2023-07-14 MED ORDER — TIRZEPATIDE 2.5 MG/0.5ML ~~LOC~~ SOAJ: 2.5000 mg | SUBCUTANEOUS | 3 refills | Status: DC

## 2023-07-14 NOTE — Telephone Encounter (Signed)
 Copied from CRM [id number]. Topic: Clinical - Prescription Issue >> Jul 14, 2023 12:50 PM DeAngela L wrote: Reason for CRM: Patient calling to ask if the Dr office could reduce and resubmit the prescription for  tirzepatide [facility]) 5 MG/0.5ML Pen But can the doseage to 2.5mg  so his insurance cn cover complete the prescription  [id number] - [place], [place] - [address] [place] [address] Phone: [phone] Fax: [phone]

## 2023-07-14 NOTE — Telephone Encounter (Signed)
 Mounjaro 2.5 mg Prescription sent to pharmacy. I am unsure why he wanted this decreased.

## 2023-07-14 NOTE — Addendum Note (Signed)
 Addended by: Jannifer Rodney A on: 07/14/2023 03:35 PM   Modules accepted: Orders

## 2023-07-25 ENCOUNTER — Ambulatory Visit (INDEPENDENT_AMBULATORY_CARE_PROVIDER_SITE_OTHER): Admitting: Nurse Practitioner

## 2023-07-25 ENCOUNTER — Encounter: Payer: Self-pay | Admitting: Nurse Practitioner

## 2023-07-25 VITALS — BP 137/85 | HR 70 | Temp 97.9°F | Ht 71.0 in | Wt 292.0 lb

## 2023-07-25 DIAGNOSIS — M545 Low back pain, unspecified: Secondary | ICD-10-CM | POA: Diagnosis not present

## 2023-07-25 MED ORDER — PREDNISONE 20 MG PO TABS: ORAL_TABLET | ORAL | 0 refills | Status: DC

## 2023-07-25 MED ORDER — METHYLPREDNISOLONE ACETATE 80 MG/ML IJ SUSP
80.0000 mg | Freq: Once | INTRAMUSCULAR | Status: AC
  Administered 2023-07-25: 80 mg via INTRAMUSCULAR

## 2023-07-25 MED ORDER — CYCLOBENZAPRINE HCL 10 MG PO TABS: 10.0000 mg | ORAL_TABLET | Freq: Three times a day (TID) | ORAL | 1 refills | Status: DC | PRN

## 2023-07-25 MED ORDER — KETOROLAC TROMETHAMINE 60 MG/2ML IM SOLN
60.0000 mg | Freq: Once | INTRAMUSCULAR | Status: AC
  Administered 2023-07-25: 60 mg via INTRAMUSCULAR

## 2023-07-25 NOTE — Patient Instructions (Signed)
 Acute Back Pain, Adult Acute back pain is sudden and usually short-lived. It is often caused by an injury to the muscles and tissues in the back. The injury may result from: A muscle, tendon, or ligament getting overstretched or torn. Ligaments are tissues that connect bones to each other. Lifting something improperly can cause a back strain. Wear and tear (degeneration) of the spinal disks. Spinal disks are circular tissue that provide cushioning between the bones of the spine (vertebrae). Twisting motions, such as while playing sports or doing yard work. A hit to the back. Arthritis. You may have a physical exam, lab tests, and imaging tests to find the cause of your pain. Acute back pain usually goes away with rest and home care. Follow these instructions at home: Managing pain, stiffness, and swelling Take over-the-counter and prescription medicines only as told by your health care provider. Treatment may include medicines for pain and inflammation that are taken by mouth or applied to the skin, or muscle relaxants. Your health care provider may recommend applying ice during the first 24-48 hours after your pain starts. To do this: Put ice in a plastic bag. Place a towel between your skin and the bag. Leave the ice on for 20 minutes, 2-3 times a day. Remove the ice if your skin turns bright red. This is very important. If you cannot feel pain, heat, or cold, you have a greater risk of damage to the area. If directed, apply heat to the affected area as often as told by your health care provider. Use the heat source that your health care provider recommends, such as a moist heat pack or a heating pad. Place a towel between your skin and the heat source. Leave the heat on for 20-30 minutes. Remove the heat if your skin turns bright red. This is especially important if you are unable to feel pain, heat, or cold. You have a greater risk of getting burned. Activity  Do not stay in bed. Staying in  bed for more than 1-2 days can delay your recovery. Sit up and stand up straight. Avoid leaning forward when you sit or hunching over when you stand. If you work at a desk, sit close to it so you do not need to lean over. Keep your chin tucked in. Keep your neck drawn back, and keep your elbows bent at a 90-degree angle (right angle). Sit high and close to the steering wheel when you drive. Add lower back (lumbar) support to your car seat, if needed. Take short walks on even surfaces as soon as you are able. Try to increase the length of time you walk each day. Do not sit, drive, or stand in one place for more than 30 minutes at a time. Sitting or standing for long periods of time can put stress on your back. Do not drive or use heavy machinery while taking prescription pain medicine. Use proper lifting techniques. When you bend and lift, use positions that put less stress on your back: [place] your knees. Keep the load close to your body. Avoid twisting. Exercise regularly as told by your health care provider. Exercising helps your back heal faster and helps prevent back injuries by keeping muscles strong and flexible. Work with a physical therapist to make a safe exercise program, as recommended by your health care provider. Do any exercises as told by your physical therapist. Lifestyle Maintain a healthy weight. Extra weight puts stress on your back and makes it difficult to have good  posture. Avoid activities or situations that make you feel anxious or stressed. Stress and anxiety increase muscle tension and can make back pain worse. Learn ways to manage anxiety and stress, such as through exercise. General instructions Sleep on a firm mattress in a comfortable position. Try lying on your side with your knees slightly bent. If you lie on your back, put a pillow under your knees. Keep your head and neck in a straight line with your spine (neutral position) when using electronic equipment like  smartphones or pads. To do this: Raise your smartphone or pad to look at it instead of bending your head or neck to look down. Put the smartphone or pad at the level of your face while looking at the screen. Follow your treatment plan as told by your health care provider. This may include: Cognitive or behavioral therapy. Acupuncture or massage therapy. Meditation or yoga. Contact a health care provider if: You have pain that is not relieved with rest or medicine. You have increasing pain going down into your legs or buttocks. Your pain does not improve after 2 weeks. You have pain at night. You lose weight without trying. You have a fever or chills. You develop nausea or vomiting. You develop abdominal pain. Get help right away if: You develop new bowel or bladder control problems. You have unusual weakness or numbness in your arms or legs. You feel faint. These symptoms may represent a serious problem that is an emergency. Do not wait to see if the symptoms will go away. Get medical help right away. Call your local emergency services (911 in the [place] Do not drive yourself to the hospital. Summary Acute back pain is sudden and usually short-lived. Use proper lifting techniques. When you bend and lift, use positions that put less stress on your back. Take over-the-counter and prescription medicines only as told by your health care provider, and apply heat or ice as told. This information is not intended to replace advice given to you by your health care provider. Make sure you discuss any questions you have with your health care provider. Document Revised: 06/13/2020 Document Reviewed: 06/13/2020 Elsevier Patient Education  2024 [employer].

## 2023-07-25 NOTE — Progress Notes (Signed)
   Subjective:    Patient ID: Ernest Miller, male    DOB: Apr 03, 1976, 48 y.o.   MRN: [id number]   Chief Complaint: Back Pain   Back Pain This is a new problem. The current episode started in the past 7 days. The problem occurs intermittently. The problem has been waxing and waning since onset. The pain is present in the lumbar spine. The quality of the pain is described as aching and stabbing. The pain does not radiate. The pain is at a severity of 10/10. The pain is The same all the time. Exacerbated by: certain movements will cause it to catch. Pertinent negatives include no bladder incontinence, bowel incontinence, paresthesias, pelvic pain, tingling or weakness. Treatments tried: mobic . The treatment provided mild relief.    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  Genitourinary:  Negative for bladder incontinence and pelvic pain.  Musculoskeletal:  Positive for back pain.  Neurological:  Negative for tingling, weakness and paresthesias.       Objective:   Physical Exam Constitutional:      Appearance: Normal appearance.  Cardiovascular:     Rate and Rhythm: Normal rate and regular rhythm.     Heart sounds: Normal heart sounds.  Pulmonary:     Effort: Pulmonary effort is normal.     Breath sounds: Normal breath sounds.  Musculoskeletal:     Comments: Rises slowly from sitting to standing Ambulating very slowly (+) SLR on left at 90degrees  Skin:    General: Skin is warm.  Neurological:      General: No focal deficit present.     Mental Status: He is alert.  Psychiatric:        Mood and Affect: Mood normal.    BP 137/85   Pulse 70   Temp 97.9 F (36.6 C) (Temporal)   Ht 5\' 11"  (1.803 m)   Wt 292 lb (132.5 kg)   SpO2 95%   BMI 40.73 kg/m         Assessment & Plan:   Ernest Miller in today with chief complaint of Back Pain   1. Acute left-sided low back pain without sciatica (Primary) Moist heat Rest Back stretches - methylPREDNISolone  acetate (DEPO-MEDROL ) injection 80 mg - ketorolac  (TORADOL ) injection 60 mg - predniSONE  (DELTASONE ) 20 MG tablet; 2 po at sametime daily for 5 days-  Dispense: 10 tablet; Refill: 0 - cyclobenzaprine  (FLEXERIL ) 10 MG tablet; Take 1 tablet (10 mg total) by mouth 3 (three) times daily as needed for muscle spasms.  Dispense: 30 tablet; Refill: 1    The above assessment and management plan was discussed with the patient. The patient verbalized understanding of and has agreed to the management plan. Patient is aware to call the clinic if symptoms persist or worsen. Patient is aware when to return to the clinic for a follow-up visit. Patient educated on when it is appropriate to go to the emergency department.   Mary-Margaret Gaylyn Keas, FNP

## 2023-08-10 ENCOUNTER — Telehealth: Payer: Self-pay

## 2023-08-10 NOTE — Telephone Encounter (Signed)
 Pharmacy Patient Advocate Encounter  Received notification from [facility] that Prior Authorization for MOUNJARO  2.5MG /0.5ML PEN  has been DENIED.  Full denial letter will be uploaded to the media tab. See denial reason below.   PA #/Case ID/Reference #: [id number]

## 2023-08-10 NOTE — Telephone Encounter (Signed)
 Pharmacy Patient Advocate Encounter   Received notification from CoverMyMeds that prior authorization for Mounjaro  2.5MG /0.5ML auto-injectors is due for renewal.   Insurance verification completed.   The patient is insured through [facility].  Action: PA required; PA submitted to above mentioned insurance via CoverMyMeds Key/confirmation #/EOC [id number] Status is pending

## 2023-08-11 NOTE — Telephone Encounter (Signed)
 Hold for Ernest Miller return to office

## 2023-08-16 NOTE — Telephone Encounter (Signed)
 Pt has tried Metformin , Trulicity , and Ozempic  in the past.

## 2023-08-17 ENCOUNTER — Telehealth: Payer: Self-pay | Admitting: Pharmacist

## 2023-08-17 NOTE — Telephone Encounter (Signed)
 Is pt alright to try Rybelsus ?

## 2023-08-17 NOTE — Telephone Encounter (Signed)
 Per the insurance guidelines, the patient is required to trial up to three preferred products: Trulicity , Rybelsus , and Victoza  (liraglutide ). I see documentation supporting prior trials of Trulicity  and Victoza .  To give this appeal the best possible chance of approval, could you please confirm whether there is a clinical reason the patient is unable to trial Rybelsus ? If so, we will need that explanation clearly documented to strengthen the case.  Thank you, Dene Fines, PharmD Clinical Pharmacist    Direct Dial: [phone]

## 2023-08-18 ENCOUNTER — Telehealth: Payer: Self-pay

## 2023-08-18 ENCOUNTER — Other Ambulatory Visit (HOSPITAL_COMMUNITY): Payer: Self-pay

## 2023-08-18 MED ORDER — RYBELSUS 3 MG PO TABS: 3.0000 mg | ORAL_TABLET | Freq: Every day | ORAL | 1 refills | Status: DC

## 2023-08-18 NOTE — Telephone Encounter (Signed)
 Please let Pt know insurance denies Mounjaro . Will send in Rybelsus  which is oral form for him to try.

## 2023-08-18 NOTE — Telephone Encounter (Signed)
 Pharmacy Patient Advocate Encounter   Received notification from Patient Pharmacy that prior authorization for Rybelsus  3 is required/requested.   Insurance verification completed.   The patient is insured through [facility] .   Per test claim: PA required; PA submitted to above mentioned insurance via CoverMyMeds Key/confirmation #/EOC [id number] Status is pending

## 2023-08-18 NOTE — Telephone Encounter (Signed)
 Left vm for cb

## 2023-08-18 NOTE — Telephone Encounter (Signed)
 Patient is adamant that he does not want anything but mounjaro . He is asking if Kathaleen Pale can do anything with the insurance company to get them to reverse.

## 2023-08-19 NOTE — Telephone Encounter (Addendum)
Pt aware of provider feedback and voiced understanding. 

## 2023-08-19 NOTE — Telephone Encounter (Signed)
 Recommend patient try new medication. If he has adverse effects we can document this and insurance should then approve Mounjaro .

## 2023-08-19 NOTE — Telephone Encounter (Signed)
 Pharmacy Patient Advocate Encounter  Received notification from [facility] that Prior Authorization for RYBELSUS  3MG  has been DENIED.  Full denial letter will be uploaded to the media tab. See denial reason below.   PA #/Case ID/Reference #: [id number]

## 2023-08-30 ENCOUNTER — Other Ambulatory Visit (HOSPITAL_COMMUNITY): Payer: Self-pay

## 2023-08-30 ENCOUNTER — Telehealth: Payer: Self-pay

## 2023-08-30 NOTE — Telephone Encounter (Signed)
 PA request has been RESUBMITTED. New Encounter has been or will be created for follow up. For additional info see Pharmacy Prior Auth telephone encounter from 08/30/23.

## 2023-08-30 NOTE — Telephone Encounter (Signed)
 Pharmacy Patient Advocate Encounter   Received notification from Pt Calls Messages that prior authorization for Rybelsus  3MG  tablets is required/requested.   Insurance verification completed.   The patient is insured through [facility] .   Per test claim: PA required and submitted KEY/EOC/Request #: [id number] from 08/30/23 to 08/29/24. Ran test claim, Copay is $15. This test claim was processed through [facility]- copay amounts may vary at other pharmacies due to pharmacy/plan contracts, or as the patient moves through the different stages of their insurance plan.

## 2023-08-30 NOTE — Telephone Encounter (Signed)
 PA approved.

## 2023-09-01 ENCOUNTER — Encounter: Payer: Self-pay | Admitting: Family

## 2023-09-01 ENCOUNTER — Ambulatory Visit (INDEPENDENT_AMBULATORY_CARE_PROVIDER_SITE_OTHER): Payer: 59 | Admitting: Family

## 2023-09-01 VITALS — BP 136/81 | HR 61 | Temp 97.6°F | Ht 71.0 in | Wt 290.0 lb

## 2023-09-01 DIAGNOSIS — E1169 Type 2 diabetes mellitus with other specified complication: Secondary | ICD-10-CM

## 2023-09-01 DIAGNOSIS — Z0001 Encounter for general adult medical examination with abnormal findings: Secondary | ICD-10-CM

## 2023-09-01 DIAGNOSIS — Z0000 Encounter for general adult medical examination without abnormal findings: Secondary | ICD-10-CM

## 2023-09-01 DIAGNOSIS — N529 Male erectile dysfunction, unspecified: Secondary | ICD-10-CM | POA: Diagnosis not present

## 2023-09-01 DIAGNOSIS — Z7984 Long term (current) use of oral hypoglycemic drugs: Secondary | ICD-10-CM | POA: Diagnosis not present

## 2023-09-01 DIAGNOSIS — K219 Gastro-esophageal reflux disease without esophagitis: Secondary | ICD-10-CM | POA: Diagnosis not present

## 2023-09-01 DIAGNOSIS — Z6841 Body Mass Index (BMI) 40.0 and over, adult: Secondary | ICD-10-CM | POA: Diagnosis not present

## 2023-09-01 DIAGNOSIS — E785 Hyperlipidemia, unspecified: Secondary | ICD-10-CM | POA: Diagnosis not present

## 2023-09-01 DIAGNOSIS — Z1211 Encounter for screening for malignant neoplasm of colon: Secondary | ICD-10-CM

## 2023-09-01 DIAGNOSIS — F411 Generalized anxiety disorder: Secondary | ICD-10-CM | POA: Diagnosis not present

## 2023-09-01 DIAGNOSIS — I10 Essential (primary) hypertension: Secondary | ICD-10-CM | POA: Diagnosis not present

## 2023-09-01 MED ORDER — RYBELSUS 3 MG PO TABS: 3.0000 mg | ORAL_TABLET | Freq: Every day | ORAL | 1 refills | Status: DC

## 2023-09-01 MED ORDER — LANCET DEVICE MISC: 1.0000 | Freq: Three times a day (TID) | 0 refills | Status: AC

## 2023-09-01 MED ORDER — TADALAFIL 10 MG PO TABS: 10.0000 mg | ORAL_TABLET | ORAL | 1 refills | Status: DC | PRN

## 2023-09-01 MED ORDER — LANCETS MISC. MISC: 1.0000 | Freq: Three times a day (TID) | 0 refills | Status: AC

## 2023-09-01 MED ORDER — LOSARTAN POTASSIUM 25 MG PO TABS: 25.0000 mg | ORAL_TABLET | Freq: Every day | ORAL | 3 refills | Status: DC

## 2023-09-01 MED ORDER — BLOOD GLUCOSE MONITORING SUPPL DEVI: 1.0000 | Freq: Three times a day (TID) | 0 refills | Status: AC

## 2023-09-01 MED ORDER — OMEPRAZOLE 40 MG PO CPDR: 40.0000 mg | DELAYED_RELEASE_CAPSULE | Freq: Every day | ORAL | 3 refills | Status: AC

## 2023-09-01 MED ORDER — BLOOD GLUCOSE TEST VI STRP
1.0000 | ORAL_STRIP | Freq: Three times a day (TID) | 0 refills | Status: DC
Start: 2023-09-01 — End: 2023-09-29

## 2023-09-01 NOTE — Patient Instructions (Signed)

## 2023-09-01 NOTE — Progress Notes (Signed)
 Subjective:    Patient ID: Ernest Miller, male    DOB: October 27, 1975, 48 y.o.   MRN: [id number]  Chief Complaint  Patient presents with   Medical Management of Chronic Issues   PT presents to the office today for chronic follow up.   His Mounjaro  is not covered and his insurance wants him to try Rybelsus . Has not picked this up yet.   States the statins cause him myopathy.   He reports he is having a hard time maintaining erection. Has tried Viagra , but gave him a horrible headache. Wants to try another medication.   Hypertension This is a chronic problem. The current episode started more than 1 year ago. The problem has been resolved since onset. The problem is uncontrolled. Associated symptoms include anxiety. Pertinent negatives include no blurred vision, malaise/fatigue, peripheral edema or shortness of breath. Risk factors for coronary artery disease include dyslipidemia, diabetes mellitus, obesity and male gender. Past treatments include nothing. The current treatment provides moderate improvement.  Gastroesophageal Reflux He complains of belching, heartburn and a hoarse voice. This is a chronic problem. The current episode started more than 1 year ago. The problem occurs occasionally. Risk factors include obesity. He has tried a PPI for the symptoms. The treatment provided moderate relief.  Hyperlipidemia This is a chronic problem. The current episode started more than 1 year ago. The problem is uncontrolled. Recent lipid tests were reviewed and are high. Exacerbating diseases include obesity. Pertinent negatives include no shortness of breath. Current antihyperlipidemic treatment includes diet change. The current treatment provides no improvement of lipids. Risk factors for coronary artery disease include dyslipidemia, hypertension, diabetes mellitus, male sex and a sedentary lifestyle.  Diabetes He presents for his follow-up diabetic visit. He has type 2 diabetes mellitus. Hypoglycemia  symptoms include nervousness/anxiousness. Pertinent negatives for diabetes include no blurred vision and no foot paresthesias. Symptoms are stable. Risk factors for coronary artery disease include dyslipidemia, diabetes mellitus, hypertension, sedentary lifestyle and post-menopausal. He is following a generally unhealthy diet. His overall blood glucose range is 140-180 mg/dl. (Not checking glucose at home) Eye exam is not current.  Anxiety Presents for follow-up visit. Symptoms include excessive worry, nervous/anxious behavior and obsessions. Patient reports no shortness of breath. Symptoms occur occasionally. The severity of symptoms is moderate.        Review of Systems  Constitutional:  Negative for malaise/fatigue.  HENT:  Positive for hoarse voice.   Eyes:  Negative for blurred vision.  Respiratory:  Negative for shortness of breath.   Gastrointestinal:  Positive for heartburn.  Psychiatric/Behavioral:  The patient is nervous/anxious.   All other systems reviewed and are negative.  History reviewed. No pertinent family history. Social History   Socioeconomic History   Marital status: Significant Other    Spouse name: Not on file   Number of children: Not on file   Years of education: Not on file   Highest education level: Not on file  Occupational History   Not on file  Tobacco Use   Smoking status: Never   Smokeless tobacco: Never  Vaping Use   Vaping status: Never Used  Substance and Sexual Activity   Alcohol use: No   Drug use: Yes    Types: Marijuana   Sexual activity: Yes  Other Topics Concern   Not on file  Social History Narrative   Not on file   Social Drivers of Health   Financial Resource Strain: Not on file  Food Insecurity: Not on file  Transportation Needs: Not on file  Physical Activity: Not on file  Stress: Not on file  Social Connections: Unknown (08/18/2021)   Received from [facility]   Social Network    Social Network: Not  on file       Objective:   Physical Exam Vitals reviewed.  Constitutional:      General: He is not in acute distress.    Appearance: He is well-developed. He is obese.  HENT:     Head: Normocephalic.     Right Ear: Tympanic membrane normal.     Left Ear: Tympanic membrane normal.  Eyes:     General:        Right eye: No discharge.        Left eye: No discharge.     Pupils: Pupils are equal, round, and reactive to light.  Neck:     Thyroid: No thyromegaly.  Cardiovascular:     Rate and Rhythm: Normal rate and regular rhythm.     Heart sounds: Normal heart sounds. No murmur heard. Pulmonary:     Effort: Pulmonary effort is normal. No respiratory distress.     Breath sounds: Normal breath sounds. No wheezing.  Abdominal:     General: Bowel sounds are normal. There is no distension.     Palpations: Abdomen is soft.     Tenderness: There is no abdominal tenderness.  Musculoskeletal:        General: No tenderness. Normal range of motion.     Cervical back: Normal range of motion and neck supple.  Skin:    General: Skin is warm and dry.     Findings: No erythema or rash.     Comments: Bilateral great toe nails thick and discolored.   Neurological:     Mental Status: He is alert and oriented to person, place, and time.     Cranial Nerves: No cranial nerve deficit.     Deep Tendon Reflexes: Reflexes are normal and symmetric.  Psychiatric:        Behavior: Behavior normal.        Thought Content: Thought content normal.        Judgment: Judgment normal.       BP 136/81   Pulse 61   Temp 97.6 F (36.4 C) (Temporal)   Ht 5\' 11"  (1.803 m)   Wt 290 lb (131.5 kg)   SpO2 97%   BMI 40.45 kg/m      Assessment & Plan:  Ernest Miller comes in today with chief complaint of Medical Management of Chronic Issues   Diagnosis and orders addressed: 1. Annual physical exam (Primary) - Bayer DCA Hb A1c Waived - CMP14+EGFR - CBC with Differential/Platelet - Lipid panel -  PSA, total and free - TSH - Vitamin B12 - Lancet Device MISC; 1 each by Does not apply route in the morning, at noon, and at bedtime. May substitute to any manufacturer covered by patient's insurance.  Dispense: 1 each; Refill: 0 - Lancets Misc. MISC; 1 each by Does not apply route in the morning, at noon, and at bedtime. May substitute to any manufacturer covered by patient's insurance.  Dispense: 100 each; Refill: 0  2. GAD (generalized anxiety disorder) - CMP14+EGFR - CBC with Differential/Platelet  3. Type 2 diabetes mellitus with other specified complication, without long-term current use of insulin  (HCC) - Microalbumin / creatinine urine ratio - Bayer DCA Hb A1c Waived - CMP14+EGFR - CBC with Differential/Platelet - TSH - Vitamin B12 -  Semaglutide  (RYBELSUS ) 3 MG TABS; Take 1 tablet (3 mg total) by mouth daily.  Dispense: 90 tablet; Refill: 1 - losartan  (COZAAR ) 25 MG tablet; Take 1 tablet (25 mg total) by mouth daily.  Dispense: 90 tablet; Refill: 3 - Blood Glucose Monitoring Suppl DEVI; 1 each by Does not apply route in the morning, at noon, and at bedtime. May substitute to any manufacturer covered by patient's insurance.  Dispense: 1 each; Refill: 0 - Glucose Blood (BLOOD GLUCOSE TEST STRIPS) STRP; 1 each by In Vitro route in the morning, at noon, and at bedtime. May substitute to any manufacturer covered by patient's insurance.  Dispense: 100 strip; Refill: 0  4. Hyperlipidemia associated with type 2 diabetes mellitus (HCC) - CMP14+EGFR - CBC with Differential/Platelet - Lipid panel - Vitamin B12  5. Morbid obesity (HCC) - CMP14+EGFR - CBC with Differential/Platelet  6. Primary hypertension - CMP14+EGFR - CBC with Differential/Platelet - losartan  (COZAAR ) 25 MG tablet; Take 1 tablet (25 mg total) by mouth daily.  Dispense: 90 tablet; Refill: 3  7. Gastroesophageal reflux disease without esophagitis - CMP14+EGFR - CBC with Differential/Platelet - omeprazole   (PRILOSEC) 40 MG capsule; Take 1 capsule (40 mg total) by mouth daily.  Dispense: 90 capsule; Refill: 3  8. Colon cancer screening - Ambulatory referral to Gastroenterology - CMP14+EGFR - CBC with Differential/Platelet  9. Erectile dysfunction, unspecified erectile dysfunction type Start Cialis  as needed - tadalafil  (CIALIS ) 10 MG tablet; Take 1 tablet (10 mg total) by mouth every other day as needed for erectile dysfunction.  Dispense: 10 tablet; Refill: 1   Labs pending Will start Rybelsus   Cialis  rx given today.  Continue current medications  Health Maintenance reviewed Diet and exercise encouraged  Follow up plan: 3 months   Tommas Fragmin, FNP

## 2023-09-02 ENCOUNTER — Encounter: Payer: Self-pay | Admitting: Gastroenterology

## 2023-09-08 ENCOUNTER — Ambulatory Visit: Payer: Self-pay

## 2023-09-08 NOTE — Telephone Encounter (Signed)
 Appt made with dod tomorrow

## 2023-09-08 NOTE — Telephone Encounter (Signed)
 FYI Only or Action Required?: FYI only for provider  Patient was last seen in primary care on 09/01/2023 by Yevette Hem, FNP. Called Nurse Triage reporting Vomiting. Symptoms began several days ago. Interventions attempted: OTC medications: immodium/anti-nausea. Symptoms are: stable.  Triage Disposition: See Physician Within 24 Hours  Patient/caregiver understands and will follow disposition?: Yes  Copied from CRM [id number]. Topic: Clinical - Red Word Triage >> Sep 08, 2023  9:54 AM Carlatta H wrote: Red Word that prompted transfer to Nurse Triage: Patient has been taking Semaglutide  (RYBELSUS ) 3 MG TABS [[id number]] for a week//He has been vomiting and very sick all week// Reason for Disposition  [1] MILD or MODERATE vomiting AND [2] present > 48 hours (2 days) (Exception: Mild vomiting with associated diarrhea.)  Answer Assessment - Initial Assessment Questions 1. VOMITING SEVERITY: "How many times have you vomited in the past 24 hours?"     - MILD:  1 - 2 times/day    - MODERATE: 3 - 5 times/day, decreased oral intake without significant weight loss or symptoms of dehydration    - SEVERE: 6 or more times/day, vomits everything or nearly everything, with significant weight loss, symptoms of dehydration      "A couple of time" a day 2. ONSET: "When did the vomiting begin?"      X week 3. FLUIDS: "What fluids or food have you vomited up today?" "Have you been able to keep any fluids down?"     Yes, has kept down fluids today Reports not taking new Rx and sx improved, with still nausea 4. ABDOMEN PAIN: "Are your having any abdomen pain?" If Yes : "How bad is it and what does it feel like?" (e.g., crampy, dull, intermittent, constant)      Intermittent intense pain with eating/drinking 5. DIARRHEA: "Is there any diarrhea?" If Yes, ask: "How many times today?"      Has not gone this AM - reports taking imodium and nausea meds 6. CONTACTS: "Is there anyone else in the family with the same  symptoms?"      denies 7. CAUSE: "What do you think is causing your vomiting?"     Things r/t switching medications from Monjuaro to Rybelus (started Friday) 8. HYDRATION STATUS: "Any signs of dehydration?" (e.g., dry mouth [not only dry lips], too weak to stand) "When did you last urinate?"     Denies, "I just feel not up to normal" "kinda weak, kinda blah" 9. OTHER SYMPTOMS: "Do you have any other symptoms?" (e.g., fever, headache, vertigo, vomiting blood or coffee grounds, recent head injury)     N/V Denies other sx above Rash - arms and legs, reports previous Rx from PCP that has provided relief 10. PREGNANCY: "Is there any chance you are pregnant?" "When was your last menstrual period?"       N/a  Protocols used: Vomiting-A-AH

## 2023-09-09 ENCOUNTER — Telehealth: Payer: Self-pay | Admitting: Pharmacist

## 2023-09-09 ENCOUNTER — Encounter: Payer: Self-pay | Admitting: Family Medicine

## 2023-09-09 ENCOUNTER — Other Ambulatory Visit: Payer: Self-pay | Admitting: Family Medicine

## 2023-09-09 ENCOUNTER — Ambulatory Visit (INDEPENDENT_AMBULATORY_CARE_PROVIDER_SITE_OTHER): Admitting: Family Medicine

## 2023-09-09 VITALS — BP 139/81 | HR 74 | Temp 98.0°F | Ht 71.0 in | Wt 291.6 lb

## 2023-09-09 DIAGNOSIS — E785 Hyperlipidemia, unspecified: Secondary | ICD-10-CM | POA: Diagnosis not present

## 2023-09-09 DIAGNOSIS — Z0000 Encounter for general adult medical examination without abnormal findings: Secondary | ICD-10-CM | POA: Diagnosis not present

## 2023-09-09 DIAGNOSIS — E1159 Type 2 diabetes mellitus with other circulatory complications: Secondary | ICD-10-CM

## 2023-09-09 DIAGNOSIS — I152 Hypertension secondary to endocrine disorders: Secondary | ICD-10-CM | POA: Diagnosis not present

## 2023-09-09 DIAGNOSIS — I10 Essential (primary) hypertension: Secondary | ICD-10-CM | POA: Diagnosis not present

## 2023-09-09 DIAGNOSIS — Z7985 Long-term (current) use of injectable non-insulin antidiabetic drugs: Secondary | ICD-10-CM

## 2023-09-09 DIAGNOSIS — E1169 Type 2 diabetes mellitus with other specified complication: Secondary | ICD-10-CM

## 2023-09-09 DIAGNOSIS — R112 Nausea with vomiting, unspecified: Secondary | ICD-10-CM

## 2023-09-09 DIAGNOSIS — Z789 Other specified health status: Secondary | ICD-10-CM | POA: Diagnosis not present

## 2023-09-09 DIAGNOSIS — R197 Diarrhea, unspecified: Secondary | ICD-10-CM

## 2023-09-09 DIAGNOSIS — Z6841 Body Mass Index (BMI) 40.0 and over, adult: Secondary | ICD-10-CM

## 2023-09-09 DIAGNOSIS — F411 Generalized anxiety disorder: Secondary | ICD-10-CM | POA: Diagnosis not present

## 2023-09-09 DIAGNOSIS — K219 Gastro-esophageal reflux disease without esophagitis: Secondary | ICD-10-CM | POA: Diagnosis not present

## 2023-09-09 DIAGNOSIS — Z1211 Encounter for screening for malignant neoplasm of colon: Secondary | ICD-10-CM | POA: Diagnosis not present

## 2023-09-09 LAB — LIPID PANEL

## 2023-09-09 LAB — BAYER DCA HB A1C WAIVED: HB A1C (BAYER DCA - WAIVED): 6 % — ABNORMAL HIGH (ref 4.8–5.6)

## 2023-09-09 MED ORDER — ENALAPRIL MALEATE 2.5 MG PO TABS: 2.5000 mg | ORAL_TABLET | Freq: Every day | ORAL | 0 refills | Status: DC

## 2023-09-09 MED ORDER — TIRZEPATIDE 2.5 MG/0.5ML ~~LOC~~ SOAJ: 2.5000 mg | SUBCUTANEOUS | 0 refills | Status: DC

## 2023-09-09 NOTE — Patient Instructions (Signed)

## 2023-09-09 NOTE — Telephone Encounter (Signed)
  TRULICITY  0.75 MG/0.5ML SOAJ        Changed from: tirzepatide  (MOUNJARO ) 2.   Pharmacy comment: Alternative Requested:NOT COVERED BY INSURANCE.   All Pharmacy Suggested Alternatives:  Dulaglutide  (TRULICITY ) 0.75 MG/0.5ML SOAJ liraglutide  (VICTOZA ) 18 MG/3ML SOPN

## 2023-09-09 NOTE — Progress Notes (Signed)
 Subjective:  Patient ID: Ernest Miller, male    DOB: Jan 11, 1976, 48 y.o.   MRN: [id number]  Patient Care Team: Yevette Hem, FNP as PCP - General (Family Medicine) Amanda Jungling Joyceann No, MD as PCP - Cardiology (Cardiology) Alexia Idler, [place] (Optometry)   Chief Complaint:  Emesis, Diarrhea, and Nausea   HPI: Ernest Miller is a 48 y.o. male presenting on 09/09/2023 for Emesis, Diarrhea, and Nausea   Ernest Miller is a 48 year old male with diabetes who presents with medication intolerance to Rybelsus .  He started taking Rybelsus  on Friday and experienced severe nausea, vomiting, and diarrhea starting Sunday morning after the third dose. He also developed a rash. He stopped taking the medication on Wednesday and has since started to regain his energy, although he still experiences mild nausea. He has not vomited since stopping the medication.  He has a history of diabetes and was previously on Mounjaro  for a couple of years, which he tolerated well at a lower dose of 2.5 mg. His insurance stopped covering Mounjaro , prompting the switch to Rybelsus . He has also tried Ozempic  and other medications, which required him to take a 'GI cocktail' due to gastrointestinal side effects.  His blood sugar was 150 last night, and he has not checked it today. He is currently not on any diabetes medication since stopping Rybelsus .  He has hypertension and has been on blood pressure medications, including losartan  and lisinopril , but experiences adverse effects. He has not tried enalapril.        Relevant past medical, surgical, family, and social history reviewed and updated as indicated.  Allergies and medications reviewed and updated. Data reviewed: Chart in Epic.   Past Medical History:  Diagnosis Date   Anxiety    Complication of anesthesia    had hard time waking patient up in 2000   Dyspnea    increased exertion    History of kidney stones    Kidney stone 12/26/2017    Past  Surgical History:  Procedure Laterality Date   CHOLECYSTECTOMY  11/24/2017   LAPROSCOPIC   CHOLECYSTECTOMY N/A 11/24/2017   Procedure: LAPAROSCOPIC CHOLECYSTECTOMY;  Surgeon: Oza Blumenthal, MD;  Location: [facility] OR;  Service: General;  Laterality: N/A;   ELBOW SURGERY Right    EXTRACORPOREAL SHOCK WAVE LITHOTRIPSY Left 10/27/2017   Procedure: LEFT EXTRACORPOREAL SHOCK WAVE LITHOTRIPSY (ESWL);  Surgeon: Marco Severs, MD;  Location: [facility] ORS;  Service: Urology;  Laterality: Left;   FINGER SURGERY Right    middle   TONSILLECTOMY     WISDOM TOOTH EXTRACTION      Social History   Socioeconomic History   Marital status: Significant Other    Spouse name: Not on file   Number of children: Not on file   Years of education: Not on file   Highest education level: Not on file  Occupational History   Not on file  Tobacco Use   Smoking status: Never   Smokeless tobacco: Never  Vaping Use   Vaping status: Never Used  Substance and Sexual Activity   Alcohol use: No   Drug use: Yes    Types: Marijuana   Sexual activity: Yes  Other Topics Concern   Not on file  Social History Narrative   Not on file   Social Drivers of Health   Financial Resource Strain: Not on file  Food Insecurity: Not on file  Transportation Needs: Not on file  Physical Activity: Not on file  Stress:  Not on file  Social Connections: Unknown (08/18/2021)   Received from [facility]   Social Network    Social Network: Not on file  Intimate Partner Violence: Unknown (07/09/2021)   Received from [facility], [facility]   HITS    Physically Hurt: Not on file    Insult or Talk Down To: Not on file    Threaten Physical Harm: Not on file    Scream or Curse: Not on file    Outpatient Encounter Medications as of 09/09/2023  Medication Sig   blood glucose meter kit and supplies KIT Dispense based on patient and insurance preference. Use up to four times daily as directed.   Blood Glucose  Monitoring Suppl DEVI 1 each by Does not apply route in the morning, at noon, and at bedtime. May substitute to any manufacturer covered by patient's insurance.   enalapril (VASOTEC) 2.5 MG tablet Take 1 tablet (2.5 mg total) by mouth at bedtime.   glucose blood (ACCU-CHEK GUIDE) test strip check blood sugar up to 4 times a day Dx E11.9   Glucose Blood (BLOOD GLUCOSE TEST STRIPS) STRP 1 each by In Vitro route in the morning, at noon, and at bedtime. May substitute to any manufacturer covered by patient's insurance.   Insulin  Pen Needle 32G X 4 MM MISC Use with insulin  daily   Lancet Device MISC 1 each by Does not apply route in the morning, at noon, and at bedtime. May substitute to any manufacturer covered by patient's insurance.   Lancets Misc. MISC 1 each by Does not apply route in the morning, at noon, and at bedtime. May substitute to any manufacturer covered by patient's insurance.   omeprazole  (PRILOSEC) 40 MG capsule Take 1 capsule (40 mg total) by mouth daily.   tadalafil  (CIALIS ) 10 MG tablet Take 1 tablet (10 mg total) by mouth every other day as needed for erectile dysfunction.   tirzepatide  (MOUNJARO ) 2.5 MG/0.5ML Pen Inject 2.5 mg into the skin once a week.   [DISCONTINUED] losartan  (COZAAR ) 25 MG tablet Take 1 tablet (25 mg total) by mouth daily.   [DISCONTINUED] Semaglutide  (RYBELSUS ) 3 MG TABS Take 1 tablet (3 mg total) by mouth daily.   No facility-administered encounter medications on file as of 09/09/2023.    Allergies  Allergen Reactions   Jardiance  [Empagliflozin ] Rash    Pertinent ROS per HPI, otherwise unremarkable      Objective:  BP 139/81   Pulse 74   Temp 98 F (36.7 C)   Ht 5\' 11"  (1.803 m)   Wt 291 lb 9.6 oz (132.3 kg)   SpO2 96%   BMI 40.67 kg/m    Wt Readings from Last 3 Encounters:  09/09/23 291 lb 9.6 oz (132.3 kg)  09/01/23 290 lb (131.5 kg)  07/25/23 292 lb (132.5 kg)    Physical Exam Vitals and nursing note reviewed.  Constitutional:       General: He is not in acute distress.    Appearance: Normal appearance. He is morbidly obese. He is not ill-appearing, toxic-appearing or diaphoretic.  HENT:     Head: Normocephalic and atraumatic.     Mouth/Throat:     Mouth: Mucous membranes are moist.  Eyes:     Conjunctiva/sclera: Conjunctivae normal.     Pupils: Pupils are equal, round, and reactive to light.  Cardiovascular:     Rate and Rhythm: Normal rate and regular rhythm.     Heart sounds: Normal heart sounds.  Pulmonary:  Effort: Pulmonary effort is normal.     Breath sounds: Normal breath sounds.  Abdominal:     General: Bowel sounds are normal.     Palpations: Abdomen is soft.     Tenderness: There is no abdominal tenderness.  Musculoskeletal:     Right lower leg: No edema.     Left lower leg: No edema.  Skin:    General: Skin is warm and dry.     Capillary Refill: Capillary refill takes less than 2 seconds.     Coloration: Skin is not pale.  Neurological:     General: No focal deficit present.     Mental Status: He is alert and oriented to person, place, and time.  Psychiatric:        Mood and Affect: Mood normal.        Behavior: Behavior normal. Behavior is cooperative.        Thought Content: Thought content normal.        Judgment: Judgment normal.     Results for orders placed or performed in visit on 04/28/23  Bayer DCA Hb A1c Waived   Collection Time: 04/28/23  3:07 PM  Result Value Ref Range   HB A1C (BAYER DCA - WAIVED) 5.5 4.8 - 5.6 %  CMP14+EGFR   Collection Time: 04/28/23  3:09 PM  Result Value Ref Range   Glucose 111 (H) 70 - 99 mg/dL   BUN 20 6 - 24 mg/dL   Creatinine, Ser 6.01 (H) 0.76 - 1.27 mg/dL   eGFR 69 >09 NA/TFT/7.32   BUN/Creatinine Ratio 16 9 - 20   Sodium 142 134 - 144 mmol/L   Potassium 4.0 3.5 - 5.2 mmol/L   Chloride 103 96 - 106 mmol/L   CO2 23 20 - 29 mmol/L   Calcium  9.6 8.7 - 10.2 mg/dL   Total Protein 7.1 6.0 - 8.5 g/dL   Albumin 4.3 4.1 - 5.1 g/dL   Globulin,  Total 2.8 1.5 - 4.5 g/dL   Bilirubin Total 0.3 0.0 - 1.2 mg/dL   Alkaline Phosphatase 91 44 - 121 IU/L   AST 21 0 - 40 IU/L   ALT 22 0 - 44 IU/L       Pertinent labs & imaging results that were available during my care of the patient were reviewed by me and considered in my medical decision making.  Assessment & Plan:  Roosevelt was seen today for emesis, diarrhea and nausea.  Diagnoses and all orders for this visit:  Diarrhea in adult patient Nausea and vomiting in adult Due to Rybelsus    Medication intolerance Comments: Rybelsus  and Ozempic  Orders: -     tirzepatide  (MOUNJARO ) 2.5 MG/0.5ML Pen; Inject 2.5 mg into the skin once a week.  Type 2 diabetes mellitus with other specified complication, without long-term current use of insulin  (HCC) -     tirzepatide  (MOUNJARO ) 2.5 MG/0.5ML Pen; Inject 2.5 mg into the skin once a week.  Morbid obesity (HCC) -     tirzepatide  (MOUNJARO ) 2.5 MG/0.5ML Pen; Inject 2.5 mg into the skin once a week.  Hyperlipidemia associated with type 2 diabetes mellitus (HCC) -     tirzepatide  (MOUNJARO ) 2.5 MG/0.5ML Pen; Inject 2.5 mg into the skin once a week.  Hypertension associated with diabetes (HCC) -     enalapril (VASOTEC) 2.5 MG tablet; Take 1 tablet (2.5 mg total) by mouth at bedtime. -     tirzepatide  (MOUNJARO ) 2.5 MG/0.5ML Pen; Inject 2.5 mg into the skin once a week.  Type 2 Diabetes Mellitus He experiences intolerance to Rybelsus , causing nausea, vomiting, diarrhea, and a rash. He also has intolerance to Ozempic . His blood glucose is 150 mg/dL, which is not excessively high. Mounjaro  was effective at 2.5 mg but was discontinued due to insurance issues. - Resubmit Mounjaro  for insurance approval. - Check for Mounjaro  samples to cover until insurance approval. - Consider obtaining Mounjaro  from his sister if necessary. - Monitor blood glucose levels.  Hypertension He is on antihypertensive medication primarily for renal protection  due to diabetes-related end organ damage. He experienced side effects from losartan  and lisinopril . Enalapril 2.5 mg at bedtime is suggested as a low-potency alternative to provide renal protection without significant side effects. - Prescribe enalapril 2.5 mg at bedtime for renal protection.  Hyperlipidemia Hyperlipidemia is noted to support insurance approval for Mounjaro .  Obesity Obesity is noted to support insurance approval for Mounjaro .  Follow-up He missed blood work due to an accident at work. He is advised to follow up sooner with [facility] for proper management. - Complete blood work as per Christy's orders. - Follow up with Kathaleen Pale sooner than the scheduled six-month appointment.       Continue all other maintenance medications.  Follow up plan: Return in 3 months (on 12/10/2023), or if symptoms worsen or fail to improve, for DM.   Continue healthy lifestyle choices, including diet (rich in fruits, vegetables, and lean proteins, and low in salt and simple carbohydrates) and exercise (at least 30 minutes of moderate physical activity daily).  Educational handout given for DM  The above assessment and management plan was discussed with the patient. The patient verbalized understanding of and has agreed to the management plan. Patient is aware to call the clinic if they develop any new symptoms or if symptoms persist or worsen. Patient is aware when to return to the clinic for a follow-up visit. Patient educated on when it is appropriate to go to the emergency department.   Kattie Parrot, FNP-C [facility] [phone]

## 2023-09-09 NOTE — Telephone Encounter (Signed)
 Patient has now officially tried and failed: Victoza , Trulicity , & Rybelsus  Please complete appeal PCP Office visit from today 09/09/23 shows Rybelsus  documentation Thank you!

## 2023-09-10 LAB — LIPID PANEL
Chol/HDL Ratio: 5 ratio (ref 0.0–5.0)
Cholesterol, Total: 191 mg/dL (ref 100–199)
HDL: 38 mg/dL — ABNORMAL LOW (ref >39)
LDL Chol Calc (NIH): 123 mg/dL — ABNORMAL HIGH (ref 0–99)
Triglycerides: 167 mg/dL — ABNORMAL HIGH (ref 0–149)
VLDL Cholesterol Cal: 30 mg/dL (ref 5–40)

## 2023-09-10 LAB — CMP14+EGFR
ALT: 24 IU/L (ref 0–44)
AST: 22 IU/L (ref 0–40)
Albumin: 4.4 g/dL (ref 4.1–5.1)
Alkaline Phosphatase: 81 IU/L (ref 44–121)
BUN/Creatinine Ratio: 17 (ref 9–20)
BUN: 18 mg/dL (ref 6–24)
Bilirubin Total: 0.3 mg/dL (ref 0.0–1.2)
CO2: 19 mmol/L — ABNORMAL LOW (ref 20–29)
Calcium: 9.4 mg/dL (ref 8.7–10.2)
Chloride: 102 mmol/L (ref 96–106)
Creatinine, Ser: 1.06 mg/dL (ref 0.76–1.27)
Globulin, Total: 3 g/dL (ref 1.5–4.5)
Glucose: 85 mg/dL (ref 70–99)
Potassium: 4.6 mmol/L (ref 3.5–5.2)
Sodium: 137 mmol/L (ref 134–144)
Total Protein: 7.4 g/dL (ref 6.0–8.5)
eGFR: 86 mL/min/{1.73_m2} (ref >59)

## 2023-09-10 LAB — CBC WITH DIFFERENTIAL/PLATELET
Basophils Absolute: 0.1 10*3/uL (ref 0.0–0.2)
Basos: 1 %
EOS (ABSOLUTE): 0.4 10*3/uL (ref 0.0–0.4)
Eos: 3 %
Hematocrit: 48.6 % (ref 37.5–51.0)
Hemoglobin: 16.2 g/dL (ref 13.0–17.7)
Immature Grans (Abs): 0 10*3/uL (ref 0.0–0.1)
Immature Granulocytes: 0 %
Lymphocytes Absolute: 2.7 10*3/uL (ref 0.7–3.1)
Lymphs: 23 %
MCH: 30.4 pg (ref 26.6–33.0)
MCHC: 33.3 g/dL (ref 31.5–35.7)
MCV: 91 fL (ref 79–97)
Monocytes Absolute: 0.9 10*3/uL (ref 0.1–0.9)
Monocytes: 8 %
Neutrophils Absolute: 7.7 10*3/uL — ABNORMAL HIGH (ref 1.4–7.0)
Neutrophils: 65 %
Platelets: 271 10*3/uL (ref 150–450)
RBC: 5.33 x10E6/uL (ref 4.14–5.80)
RDW: 13.2 % (ref 11.6–15.4)
WBC: 11.8 10*3/uL — ABNORMAL HIGH (ref 3.4–10.8)

## 2023-09-10 LAB — PSA, TOTAL AND FREE
PSA, Free Pct: 19.3 %
PSA, Free: 0.29 ng/mL
Prostate Specific Ag, Serum: 1.5 ng/mL (ref 0.0–4.0)

## 2023-09-10 LAB — VITAMIN B12: Vitamin B-12: 507 pg/mL (ref 232–1245)

## 2023-09-10 LAB — TSH: TSH: 1.43 u[IU]/mL (ref 0.450–4.500)

## 2023-09-12 ENCOUNTER — Ambulatory Visit: Payer: Self-pay | Admitting: Family

## 2023-09-12 ENCOUNTER — Telehealth: Payer: Self-pay | Admitting: Pharmacist

## 2023-09-12 ENCOUNTER — Other Ambulatory Visit: Payer: Self-pay | Admitting: Family

## 2023-09-12 MED ORDER — ROSUVASTATIN CALCIUM 5 MG PO TABS: 5.0000 mg | ORAL_TABLET | Freq: Every day | ORAL | 3 refills | Status: DC

## 2023-09-12 NOTE — Telephone Encounter (Signed)
 The appeal for Mounjaro  has been approved by the insurance company:    Thank you, Dene Fines, PharmD Clinical Pharmacist  [place]  Direct Dial: [phone]

## 2023-09-19 DIAGNOSIS — E1169 Type 2 diabetes mellitus with other specified complication: Secondary | ICD-10-CM | POA: Diagnosis not present

## 2023-09-20 LAB — MICROALBUMIN / CREATININE URINE RATIO
Creatinine, Urine: 115.4 mg/dL
Microalb/Creat Ratio: 10 mg/g{creat} (ref 0–29)
Microalbumin, Urine: 11.9 ug/mL

## 2023-09-29 ENCOUNTER — Other Ambulatory Visit: Payer: Self-pay | Admitting: Family

## 2023-09-29 DIAGNOSIS — E1169 Type 2 diabetes mellitus with other specified complication: Secondary | ICD-10-CM

## 2023-10-11 ENCOUNTER — Other Ambulatory Visit: Payer: Self-pay | Admitting: Family Medicine

## 2023-10-11 DIAGNOSIS — E1159 Type 2 diabetes mellitus with other circulatory complications: Secondary | ICD-10-CM

## 2023-10-17 ENCOUNTER — Ambulatory Visit (AMBULATORY_SURGERY_CENTER): Admitting: *Deleted

## 2023-10-17 ENCOUNTER — Encounter: Payer: Self-pay | Admitting: Gastroenterology

## 2023-10-17 VITALS — Ht 71.0 in | Wt 292.0 lb

## 2023-10-17 DIAGNOSIS — Z1211 Encounter for screening for malignant neoplasm of colon: Secondary | ICD-10-CM

## 2023-10-17 MED ORDER — NA SULFATE-K SULFATE-MG SULF 17.5-3.13-1.6 GM/177ML PO SOLN: 1.0000 | Freq: Once | ORAL | 0 refills | Status: AC

## 2023-10-17 NOTE — Progress Notes (Signed)
 Pre visit completed over telephone. Instructions sent through a verified active MyChart .   No egg or soy allergy known to patient  No issues known to pt with past sedation with any surgeries or procedures Patient denies ever being told they had issues or difficulty with intubation  No FH of Malignant Hyperthermia Pt is not on diet pills Pt is not on  home 02  Pt is not on blood thinners  Pt denies issues with constipation  No A fib or A flutter Have any cardiac testing pending-- NO Pt instructed to use Singlecare.com or GoodRx for a price reduction on prep    Chart reviewed by DOROTHA Schillings, CRNA Patient counseled about holding Mounjaro  one full week prior to procedure.

## 2023-10-24 ENCOUNTER — Ambulatory Visit: Payer: Self-pay

## 2023-10-24 NOTE — Telephone Encounter (Signed)
 FYI Only or Action Required?: FYI only for provider.  Patient was last seen in primary care on 09/09/2023 by Severa Rock HERO, FNP.  Called Nurse Triage reporting Numbness.  Symptoms began several days ago.  Interventions attempted: Nothing.  Symptoms are: unchanged.  Triage Disposition: No disposition on file.  Patient/caregiver understands and will follow disposition?:             Copied from CRM [id number]. Topic: Clinical - Red Word Triage >> Oct 24, 2023  8:08 AM Merlynn A wrote: Red Word that prompted transfer to Nurse Triage: Numbness in right foot Reason for Disposition  Numbness (i.e., loss of sensation) in foot or toes  (Exception: Just tingling; numbness present > 2 weeks.)  Answer Assessment - Initial Assessment Questions 1. ONSET: When did the pain start?      Friday 2. LOCATION: Where is the pain located?   Both feet 3. PAIN: How bad is the pain?    (Scale 1-10; or mild, moderate, severe)     numbness 4. WORK OR EXERCISE: Has there been any recent work or exercise that involved this part of the body?      denies 5. CAUSE: What do you think is causing the foot pain?     Unknown  6. OTHER SYMPTOMS: Do you have any other symptoms? (e.g., leg pain, rash, fever, numbness)     Numbness, cramps 7. PREGNANCY: Is there any chance you are pregnant? When was your last menstrual period?     na  Protocols used: Foot Pain-A-AH

## 2023-10-25 ENCOUNTER — Ambulatory Visit (INDEPENDENT_AMBULATORY_CARE_PROVIDER_SITE_OTHER)

## 2023-10-25 ENCOUNTER — Encounter: Payer: Self-pay | Admitting: Family Medicine

## 2023-10-25 VITALS — BP 142/80 | HR 64 | Temp 97.3°F | Ht 71.0 in | Wt 294.8 lb

## 2023-10-25 DIAGNOSIS — R252 Cramp and spasm: Secondary | ICD-10-CM

## 2023-10-25 DIAGNOSIS — R202 Paresthesia of skin: Secondary | ICD-10-CM

## 2023-10-25 NOTE — Progress Notes (Signed)
 Subjective:  Patient ID: Ernest Miller, male    DOB: 1975-10-11, 49 y.o.   MRN: [id number]  Patient Care Team: Lavell Bari LABOR, FNP as PCP - General (Family Medicine) Alvan, Dorn FALCON, MD as PCP - Cardiology (Cardiology) Vicci Mcardle, [place] (Optometry)   Chief Complaint:  Numbness (Bilateral feet x 4 days )   HPI: Ernest Miller is a 48 y.o. male presenting on 10/25/2023 for Numbness (Bilateral feet x 4 days )   Ernest Miller is a 48 year old male with diabetes who presents with new onset numbness in his feet.  He has been experiencing numbness in his feet since Friday, described as a dull, numbing sensation similar to when an arm 'goes to sleep' but without the feeling of needles. The numbness began in his big toe and has since spread to both feet. There is no associated pain.  He mentions a recent incident where a truck tire fell on his heel, but he does not believe it is related as there is no pain or bruising.  He has a history of diabetes and notes that his blood sugars have been slightly elevated, around 118 mg/dL, as he has not been able to take his medication, Jardiance , this week.  He reports experiencing severe leg cramps recently and has been sweating a lot. He has been trying to manage this by drinking Gatorade and water .  He recalls an incident about three weeks ago where he broke two toes after hitting them against an object in the dark.  He mentions that his blood pressure has been elevated, with the last reading at home being in the 140s, which he attributes to stress about an upcoming procedure and previous difficulty waking up from anesthesia.          Relevant past medical, surgical, family, and social history reviewed and updated as indicated.  Allergies and medications reviewed and updated. Data reviewed: Chart in Epic.   Past Medical History:  Diagnosis Date   Anxiety    Complication of anesthesia    had hard time waking patient up in 2000    Diabetes mellitus without complication (HCC)    Dyspnea    increased exertion    GERD (gastroesophageal reflux disease)    History of kidney stones    Kidney stone 12/26/2017   Sleep apnea     Past Surgical History:  Procedure Laterality Date   CHOLECYSTECTOMY  11/24/2017   LAPROSCOPIC   CHOLECYSTECTOMY N/A 11/24/2017   Procedure: LAPAROSCOPIC CHOLECYSTECTOMY;  Surgeon: Vernetta Berg, MD;  Location: [facility] OR;  Service: General;  Laterality: N/A;   ELBOW SURGERY Right    EXTRACORPOREAL SHOCK WAVE LITHOTRIPSY Left 10/27/2017   Procedure: LEFT EXTRACORPOREAL SHOCK WAVE LITHOTRIPSY (ESWL);  Surgeon: Sherrilee Belvie CROME, MD;  Location: [facility];  Service: Urology;  Laterality: Left;   FINGER SURGERY Right    middle   TONSILLECTOMY     WISDOM TOOTH EXTRACTION      Social History   Socioeconomic History   Marital status: Significant Other    Spouse name: Not on file   Number of children: Not on file   Years of education: Not on file   Highest education level: Not on file  Occupational History   Not on file  Tobacco Use   Smoking status: Never   Smokeless tobacco: Never  Vaping Use   Vaping status: Never Used  Substance and Sexual Activity   Alcohol use: No   Drug use:  Yes    Types: Marijuana   Sexual activity: Yes  Other Topics Concern   Not on file  Social History Narrative   Not on file   Social Drivers of Health   Financial Resource Strain: Not on file  Food Insecurity: Not on file  Transportation Needs: Not on file  Physical Activity: Not on file  Stress: Not on file  Social Connections: Unknown (08/18/2021)   Received from [facility]   Social Network    Social Network: Not on file  Intimate Partner Violence: Unknown (07/09/2021)   Received from [facility]   HITS    Physically Hurt: Not on file    Insult or Talk Down To: Not on file    Threaten Physical Harm: Not on file    Scream or Curse: Not on file    Outpatient Encounter Medications as of  10/25/2023  Medication Sig   Accu-Chek Softclix Lancets lancets 4 (four) times daily.   blood glucose meter kit and supplies KIT Dispense based on patient and insurance preference. Use up to four times daily as directed.   Blood Glucose Monitoring Suppl DEVI 1 each by Does not apply route in the morning, at noon, and at bedtime. May substitute to any manufacturer covered by patient's insurance.   enalapril  (VASOTEC ) 2.5 MG tablet TAKE 1 TABLET BY MOUTH AT BEDTIME.   glucose blood (ACCU-CHEK GUIDE TEST) test strip Test BS in the morning, at noon and at bedtime Dx E11.9   Insulin  Pen Needle 32G X 4 MM MISC Use with insulin  daily   Lancets Misc. (ACCU-CHEK SOFTCLIX LANCET DEV) KIT 4 (four) times daily.   omeprazole  (PRILOSEC) 40 MG capsule Take 1 capsule (40 mg total) by mouth daily.   sildenafil  (VIAGRA ) 100 MG tablet Take 50-100 mg by mouth daily as needed.   tadalafil  (CIALIS ) 10 MG tablet Take 1 tablet (10 mg total) by mouth every other day as needed for erectile dysfunction.   tirzepatide  (MOUNJARO ) 2.5 MG/0.5ML Pen Inject 2.5 mg into the skin once a week.   rosuvastatin  (CRESTOR ) 5 MG tablet Take 1 tablet (5 mg total) by mouth daily. (Patient not taking: Reported on 10/25/2023)   No facility-administered encounter medications on file as of 10/25/2023.    Allergies  Allergen Reactions   Ozempic  (0.25 Or 0.5 Mg-Dose) [Semaglutide (0.25 Or 0.5mg -Dos)] Nausea And Vomiting   Jardiance  [Empagliflozin ] Rash    Pertinent ROS per HPI, otherwise unremarkable      Objective:  BP (!) 142/80 (BP Location: Right Arm, Cuff Size: Normal)   Pulse 64   Temp (!) 97.3 F (36.3 C)   Ht 5' 11 (1.803 m)   Wt 294 lb 12.8 oz (133.7 kg)   SpO2 100%   BMI 41.12 kg/m    Wt Readings from Last 3 Encounters:  10/25/23 294 lb 12.8 oz (133.7 kg)  10/17/23 292 lb (132.5 kg)  09/09/23 291 lb 9.6 oz (132.3 kg)    Physical Exam Vitals and nursing note reviewed.  Constitutional:      General: He is not in  acute distress.    Appearance: Normal appearance. He is morbidly obese. He is not ill-appearing, toxic-appearing or diaphoretic.  HENT:     Head: Normocephalic and atraumatic.     Nose: Nose normal.     Mouth/Throat:     Mouth: Mucous membranes are moist.     Pharynx: Oropharynx is clear.  Eyes:     Conjunctiva/sclera: Conjunctivae normal.     Pupils: Pupils are  equal, round, and reactive to light.  Cardiovascular:     Rate and Rhythm: Normal rate and regular rhythm.     Pulses:          Dorsalis pedis pulses are 2+ on the right side and 2+ on the left side.       Posterior tibial pulses are 2+ on the right side and 2+ on the left side.     Heart sounds: Normal heart sounds.  Pulmonary:     Effort: Pulmonary effort is normal.     Breath sounds: Normal breath sounds.  Musculoskeletal:     Right lower leg: No edema.     Left lower leg: No edema.     Right foot: No deformity.     Left foot: No deformity.  Feet:     Right foot:     Protective Sensation: 10 sites tested.  10 sites sensed.     Skin integrity: Skin integrity normal.     Toenail Condition: Right toenails are normal.     Left foot:     Protective Sensation: 10 sites tested.  10 sites sensed.     Skin integrity: Skin integrity normal.     Toenail Condition: Left toenails are normal.  Skin:    General: Skin is warm and dry.     Capillary Refill: Capillary refill takes less than 2 seconds.  Neurological:     General: No focal deficit present.     Mental Status: He is alert and oriented to person, place, and time.  Psychiatric:        Mood and Affect: Mood normal.        Behavior: Behavior normal. Behavior is cooperative.        Thought Content: Thought content normal.        Judgment: Judgment normal.     Results for orders placed or performed in visit on 09/01/23  Bayer DCA Hb A1c Waived   Collection Time: 09/09/23 12:27 PM  Result Value Ref Range   HB A1C (BAYER DCA - WAIVED) 6.0 (H) 4.8 - 5.6 %  CMP14+EGFR    Collection Time: 09/09/23 12:30 PM  Result Value Ref Range   Glucose 85 70 - 99 mg/dL   BUN 18 6 - 24 mg/dL   Creatinine, Ser 8.93 0.76 - 1.27 mg/dL   eGFR 86 >40 fO/fpw/8.26   BUN/Creatinine Ratio 17 9 - 20   Sodium 137 134 - 144 mmol/L   Potassium 4.6 3.5 - 5.2 mmol/L   Chloride 102 96 - 106 mmol/L   CO2 19 (L) 20 - 29 mmol/L   Calcium  9.4 8.7 - 10.2 mg/dL   Total Protein 7.4 6.0 - 8.5 g/dL   Albumin 4.4 4.1 - 5.1 g/dL   Globulin, Total 3.0 1.5 - 4.5 g/dL   Bilirubin Total 0.3 0.0 - 1.2 mg/dL   Alkaline Phosphatase 81 44 - 121 IU/L   AST 22 0 - 40 IU/L   ALT 24 0 - 44 IU/L  CBC with Differential/Platelet   Collection Time: 09/09/23 12:30 PM  Result Value Ref Range   WBC 11.8 (H) 3.4 - 10.8 x10E3/uL   RBC 5.33 4.14 - 5.80 x10E6/uL   Hemoglobin 16.2 13.0 - 17.7 g/dL   Hematocrit 51.3 62.4 - 51.0 %   MCV 91 79 - 97 fL   MCH 30.4 26.6 - 33.0 pg   MCHC 33.3 31.5 - 35.7 g/dL   RDW 86.7 88.3 - 84.5 %   Platelets 271 150 -  450 x10E3/uL   Neutrophils 65 Not Estab. %   Lymphs 23 Not Estab. %   Monocytes 8 Not Estab. %   Eos 3 Not Estab. %   Basos 1 Not Estab. %   Neutrophils Absolute 7.7 (H) 1.4 - 7.0 x10E3/uL   Lymphocytes Absolute 2.7 0.7 - 3.1 x10E3/uL   Monocytes Absolute 0.9 0.1 - 0.9 x10E3/uL   EOS (ABSOLUTE) 0.4 0.0 - 0.4 x10E3/uL   Basophils Absolute 0.1 0.0 - 0.2 x10E3/uL   Immature Granulocytes 0 Not Estab. %   Immature Grans (Abs) 0.0 0.0 - 0.1 x10E3/uL  Lipid panel   Collection Time: 09/09/23 12:30 PM  Result Value Ref Range   Cholesterol, Total 191 100 - 199 mg/dL   Triglycerides 832 (H) 0 - 149 mg/dL   HDL 38 (L) >60 mg/dL   VLDL Cholesterol Cal 30 5 - 40 mg/dL   LDL Chol Calc (NIH) 876 (H) 0 - 99 mg/dL   Chol/HDL Ratio 5.0 0.0 - 5.0 ratio  PSA, total and free   Collection Time: 09/09/23 12:30 PM  Result Value Ref Range   Prostate Specific Ag, Serum 1.5 0.0 - 4.0 ng/mL   PSA, Free 0.29 N/A ng/mL   PSA, Free Pct 19.3 %  TSH   Collection Time:  09/09/23 12:30 PM  Result Value Ref Range   TSH 1.430 0.450 - 4.500 uIU/mL  Vitamin B12   Collection Time: 09/09/23 12:30 PM  Result Value Ref Range   Vitamin B-12 507 232 - 1,245 pg/mL  Microalbumin / creatinine urine ratio   Collection Time: 09/19/23  9:27 AM  Result Value Ref Range   Creatinine, Urine 115.4 Not Estab. mg/dL   Microalbumin, Urine 88.0 Not Estab. ug/mL   Microalb/Creat Ratio 10 0 - 29 mg/g creat       Pertinent labs & imaging results that were available during my care of the patient were reviewed by me and considered in my medical decision making.  Assessment & Plan:  Ernest Miller was seen today for numbness.  Diagnoses and all orders for this visit:  Paresthesia of both feet -     Anemia Profile B -     CMP14+EGFR -     Magnesium  Leg cramps -     Anemia Profile B -     CMP14+EGFR -     Magnesium     Peripheral Neuropathy New onset numbness in both feet since Friday, resembling a limb 'falling asleep'. No pain, burning, or stabbing sensation. Numbness not linked to recent trauma from a truck tire incident, suggesting a systemic cause. Differential diagnosis includes diabetic neuropathy, electrolyte imbalances (e.g., hypokalemia), vitamin deficiencies (B12, ferritin), and dehydration. Diabetes increases risk for neuropathy. Recent severe leg cramps may indicate hypokalemia. - Order lab work to assess potassium, calcium , ferritin, and B12 levels. - Refer to [facility] for potential initiation of medication for diabetic neuropathy if lab results are normal and symptoms persist.  Diabetes Mellitus Diabetes with recent blood sugar levels around 118 mg/dL. Currently unable to take usual medication, potentially affecting glycemic control. - Discuss medication management with Bari if glycemic control becomes inadequate.  Hypertension Elevated blood pressure with recent readings around 142/80 mmHg. Anxiety related to an upcoming procedure may contribute to elevated  blood pressure. Anesthesia team will adjust medications and monitor closely to mitigate risks. - Reassure regarding anesthesia concerns for upcoming procedure.          Continue all other maintenance medications.  Follow up plan: Return if symptoms  worsen or fail to improve.   Continue healthy lifestyle choices, including diet (rich in fruits, vegetables, and lean proteins, and low in salt and simple carbohydrates) and exercise (at least 30 minutes of moderate physical activity daily).  Educational handout given for peripheral neuropathy   The above assessment and management plan was discussed with the patient. The patient verbalized understanding of and has agreed to the management plan. Patient is aware to call the clinic if they develop any new symptoms or if symptoms persist or worsen. Patient is aware when to return to the clinic for a follow-up visit. Patient educated on when it is appropriate to go to the emergency department.   Rosaline Bruns, FNP-C [facility] [phone]

## 2023-10-26 ENCOUNTER — Ambulatory Visit: Payer: Self-pay | Admitting: Family Medicine

## 2023-10-26 LAB — ANEMIA PROFILE B
Basophils Absolute: 0.1 x10E3/uL (ref 0.0–0.2)
Basos: 1 %
EOS (ABSOLUTE): 0.7 x10E3/uL — ABNORMAL HIGH (ref 0.0–0.4)
Eos: 6 %
Ferritin: 215 ng/mL (ref 30–400)
Folate: 8.7 ng/mL (ref >3.0)
Hematocrit: 45.6 % (ref 37.5–51.0)
Hemoglobin: 15 g/dL (ref 13.0–17.7)
Immature Grans (Abs): 0 x10E3/uL (ref 0.0–0.1)
Immature Granulocytes: 0 %
Iron Saturation: 22 (ref 15–55)
Iron: 59 ug/dL (ref 38–169)
Lymphocytes Absolute: 2.7 x10E3/uL (ref 0.7–3.1)
Lymphs: 25 %
MCH: 30.5 pg (ref 26.6–33.0)
MCHC: 32.9 g/dL (ref 31.5–35.7)
MCV: 93 fL (ref 79–97)
Monocytes Absolute: 0.7 x10E3/uL (ref 0.1–0.9)
Monocytes: 7 %
Neutrophils Absolute: 6.6 x10E3/uL (ref 1.4–7.0)
Neutrophils: 61 %
Platelets: 270 x10E3/uL (ref 150–450)
RBC: 4.92 x10E6/uL (ref 4.14–5.80)
RDW: 12.8 % (ref 11.6–15.4)
Retic Ct Pct: 1.2 % (ref 0.6–2.6)
Total Iron Binding Capacity: 269 ug/dL (ref 250–450)
UIBC: 210 ug/dL (ref 111–343)
Vitamin B-12: 613 pg/mL (ref 232–1245)
WBC: 10.9 x10E3/uL — ABNORMAL HIGH (ref 3.4–10.8)

## 2023-10-26 LAB — CMP14+EGFR
ALT: 22 IU/L (ref 0–44)
AST: 21 IU/L (ref 0–40)
Albumin: 4.3 g/dL (ref 4.1–5.1)
Alkaline Phosphatase: 75 IU/L (ref 44–121)
BUN/Creatinine Ratio: 12 (ref 9–20)
BUN: 15 mg/dL (ref 6–24)
Bilirubin Total: 0.4 mg/dL (ref 0.0–1.2)
CO2: 21 mmol/L (ref 20–29)
Calcium: 9.4 mg/dL (ref 8.7–10.2)
Chloride: 104 mmol/L (ref 96–106)
Creatinine, Ser: 1.28 mg/dL — AB (ref 0.76–1.27)
Globulin, Total: 2.6 g/dL (ref 1.5–4.5)
Glucose: 174 mg/dL — ABNORMAL HIGH (ref 70–99)
Potassium: 4.1 mmol/L (ref 3.5–5.2)
Sodium: 140 mmol/L (ref 134–144)
Total Protein: 6.9 g/dL (ref 6.0–8.5)
eGFR: 69 mL/min/1.73 (ref >59)

## 2023-10-26 LAB — MAGNESIUM: Magnesium: 2 mg/dL (ref 1.6–2.3)

## 2023-10-27 ENCOUNTER — Encounter: Payer: Self-pay | Admitting: Gastroenterology

## 2023-10-27 ENCOUNTER — Ambulatory Visit: Admitting: Gastroenterology

## 2023-10-27 VITALS — BP 150/76 | HR 59 | Temp 98.0°F | Resp 15 | Ht 71.0 in | Wt 292.0 lb

## 2023-10-27 DIAGNOSIS — K635 Polyp of colon: Secondary | ICD-10-CM

## 2023-10-27 DIAGNOSIS — Z1211 Encounter for screening for malignant neoplasm of colon: Secondary | ICD-10-CM | POA: Diagnosis not present

## 2023-10-27 DIAGNOSIS — G4730 Sleep apnea, unspecified: Secondary | ICD-10-CM | POA: Diagnosis not present

## 2023-10-27 DIAGNOSIS — D125 Benign neoplasm of sigmoid colon: Secondary | ICD-10-CM | POA: Diagnosis not present

## 2023-10-27 DIAGNOSIS — E119 Type 2 diabetes mellitus without complications: Secondary | ICD-10-CM | POA: Diagnosis not present

## 2023-10-27 DIAGNOSIS — D123 Benign neoplasm of transverse colon: Secondary | ICD-10-CM

## 2023-10-27 DIAGNOSIS — F419 Anxiety disorder, unspecified: Secondary | ICD-10-CM | POA: Diagnosis not present

## 2023-10-27 DIAGNOSIS — K5730 Diverticulosis of large intestine without perforation or abscess without bleeding: Secondary | ICD-10-CM | POA: Diagnosis not present

## 2023-10-27 MED ORDER — SODIUM CHLORIDE 0.9 % IV SOLN: 500.0000 mL | Freq: Once | INTRAVENOUS | Status: DC

## 2023-10-27 NOTE — Patient Instructions (Addendum)
 Resume previous diet and medications.  Biopsy results and follow up colonoscopy recommendations will be sent via MyChart or letter.    YOU HAD AN ENDOSCOPIC PROCEDURE TODAY AT THE [facility]:   Refer to the procedure report that was given to you for any specific questions about what was found during the examination.  If the procedure report does not answer your questions, please call your gastroenterologist to clarify.  If you requested that your care partner not be given the details of your procedure findings, then the procedure report has been included in a sealed envelope for you to review at your convenience later.  YOU SHOULD EXPECT: Some feelings of bloating in the abdomen. Passage of more gas than usual.  Walking can help get rid of the air that was put into your GI tract during the procedure and reduce the bloating. If you had a lower endoscopy (such as a colonoscopy or flexible sigmoidoscopy) you may notice spotting of blood in your stool or on the toilet paper. If you underwent a bowel prep for your procedure, you may not have a normal bowel movement for a few days.  Please Note:  You might notice some irritation and congestion in your nose or some drainage.  This is from the oxygen used during your procedure.  There is no need for concern and it should clear up in a day or so.  SYMPTOMS TO REPORT IMMEDIATELY:  Following lower endoscopy (colonoscopy or flexible sigmoidoscopy):  Excessive amounts of blood in the stool  Significant tenderness or worsening of abdominal pains  Swelling of the abdomen that is new, acute  Fever of 100F or higher  For urgent or emergent issues, a gastroenterologist can be reached at any hour by calling [phone]. Do not use MyChart messaging for urgent concerns.    DIET:  We do recommend a small meal at first, but then you may proceed to your regular diet.  Drink plenty of fluids but you should avoid alcoholic beverages for 24  hours.  ACTIVITY:  You should plan to take it easy for the rest of today and you should NOT DRIVE or use heavy machinery until tomorrow (because of the sedation medicines used during the test).    FOLLOW UP: Our staff will call the number listed on your records the next business day following your procedure.  We will call around 7:15- 8:00 am to check on you and address any questions or concerns that you may have regarding the information given to you following your procedure. If we do not reach you, we will leave a message.     If any biopsies were taken you will be contacted by phone or by letter within the next 1-3 weeks.  Please call us  at [phone] if you have not heard about the biopsies in 3 weeks.    SIGNATURES/CONFIDENTIALITY: You and/or your care partner have signed paperwork which will be entered into your electronic medical record.  These signatures attest to the fact that that the information above on your After Visit Summary has been reviewed and is understood.  Full responsibility of the confidentiality of this discharge information lies with you and/or your care-partner.

## 2023-10-27 NOTE — Progress Notes (Signed)
 [facility] History and Physical   Primary Care Physician:  Lavell Bari LABOR, FNP   Reason for Procedure:   Colon cancer screening  Plan:    Screening colonoscopy     HPI: Ernest Miller is a 48 y.o. male undergoing initial average risk screening colonoscopy.  He has no family history of colon cancer and no chronic GI symptoms.    Past Medical History:  Diagnosis Date   Anxiety    Complication of anesthesia    had hard time waking patient up in 2000   Diabetes mellitus without complication (HCC)    Dyspnea    increased exertion    GERD (gastroesophageal reflux disease)    History of kidney stones    Kidney stone 12/26/2017   Sleep apnea     Past Surgical History:  Procedure Laterality Date   CHOLECYSTECTOMY  11/24/2017   LAPROSCOPIC   CHOLECYSTECTOMY N/A 11/24/2017   Procedure: LAPAROSCOPIC CHOLECYSTECTOMY;  Surgeon: Vernetta Berg, MD;  Location: [facility] OR;  Service: General;  Laterality: N/A;   ELBOW SURGERY Right    EXTRACORPOREAL SHOCK WAVE LITHOTRIPSY Left 10/27/2017   Procedure: LEFT EXTRACORPOREAL SHOCK WAVE LITHOTRIPSY (ESWL);  Surgeon: Sherrilee Belvie CROME, MD;  Location: [facility];  Service: Urology;  Laterality: Left;   FINGER SURGERY Right    middle   TONSILLECTOMY     WISDOM TOOTH EXTRACTION      Prior to Admission medications   Medication Sig Start Date End Date Taking? Authorizing Provider  Accu-Chek Softclix Lancets lancets 4 (four) times daily. 09/01/23  Yes [provider]  blood glucose meter kit and supplies KIT Dispense based on patient and insurance preference. Use up to four times daily as directed. 10/30/21  Yes Hawks, Christy A, FNP  Blood Glucose Monitoring Suppl DEVI 1 each by Does not apply route in the morning, at noon, and at bedtime. May substitute to any manufacturer covered by patient's insurance. 09/01/23  Yes Hawks, Bari A, FNP  enalapril  (VASOTEC ) 2.5 MG tablet TAKE 1 TABLET BY MOUTH AT BEDTIME. 10/11/23  Yes Rakes,  Rock HERO, FNP  glucose blood (ACCU-CHEK GUIDE TEST) test strip Test BS in the morning, at noon and at bedtime Dx E11.9 09/29/23  Yes Hawks, Christy A, FNP  Lancets Misc. (ACCU-CHEK SOFTCLIX LANCET DEV) KIT 4 (four) times daily. 09/01/23  Yes [provider]  omeprazole  (PRILOSEC) 40 MG capsule Take 1 capsule (40 mg total) by mouth daily. 09/01/23  Yes Hawks, Bari A, FNP  Insulin  Pen Needle 32G X 4 MM MISC Use with insulin  daily Patient not taking: Reported on 10/27/2023 11/09/21   Lavell Bari LABOR, FNP  rosuvastatin  (CRESTOR ) 5 MG tablet Take 1 tablet (5 mg total) by mouth daily. Patient not taking: Reported on 10/17/2023 09/12/23   Lavell Bari LABOR, FNP  sildenafil  (VIAGRA ) 100 MG tablet Take 50-100 mg by mouth daily as needed. 10/03/23   [provider]  tadalafil  (CIALIS ) 10 MG tablet Take 1 tablet (10 mg total) by mouth every other day as needed for erectile dysfunction. 09/01/23   Lavell Bari A, FNP  tirzepatide  (MOUNJARO ) 2.5 MG/0.5ML Pen Inject 2.5 mg into the skin once a week. 09/09/23   Severa Rock HERO, FNP    Current Outpatient Medications  Medication Sig Dispense Refill   Accu-Chek Softclix Lancets lancets 4 (four) times daily.     blood glucose meter kit and supplies KIT Dispense based on patient and insurance preference. Use up to four times daily as directed. 1 each  0   Blood Glucose Monitoring Suppl DEVI 1 each by Does not apply route in the morning, at noon, and at bedtime. May substitute to any manufacturer covered by patient's insurance. 1 each 0   enalapril  (VASOTEC ) 2.5 MG tablet TAKE 1 TABLET BY MOUTH AT BEDTIME. 90 tablet 0   glucose blood (ACCU-CHEK GUIDE TEST) test strip Test BS in the morning, at noon and at bedtime Dx E11.9 300 strip 3   Lancets Misc. (ACCU-CHEK SOFTCLIX LANCET DEV) KIT 4 (four) times daily.     omeprazole  (PRILOSEC) 40 MG capsule Take 1 capsule (40 mg total) by mouth daily. 90 capsule 3   Insulin  Pen Needle 32G X 4 MM MISC Use with insulin   daily (Patient not taking: Reported on 10/27/2023) 100 each 3   rosuvastatin  (CRESTOR ) 5 MG tablet Take 1 tablet (5 mg total) by mouth daily. (Patient not taking: Reported on 10/17/2023) 90 tablet 3   sildenafil  (VIAGRA ) 100 MG tablet Take 50-100 mg by mouth daily as needed.     tadalafil  (CIALIS ) 10 MG tablet Take 1 tablet (10 mg total) by mouth every other day as needed for erectile dysfunction. 10 tablet 1   tirzepatide  (MOUNJARO ) 2.5 MG/0.5ML Pen Inject 2.5 mg into the skin once a week. 2 mL 0   Current Facility-Administered Medications  Medication Dose Route Frequency Provider Last Rate Last Admin   0.9 %  sodium chloride  infusion  500 mL Intravenous Once Stacia Glendia BRAVO, MD        Allergies as of 10/27/2023 - Review Complete 10/27/2023  Allergen Reaction Noted   Jardiance  [empagliflozin ] Rash 01/31/2020   Ozempic  (0.25 or 0.5 mg-dose) [semaglutide (0.25 or 0.5mg -dos)] Nausea And Vomiting 10/17/2023    Family History  Problem Relation Age of Onset   Colon cancer Neg Hx    Colon polyps Neg Hx    Rectal cancer Neg Hx    Stomach cancer Neg Hx    Esophageal cancer Neg Hx     Social History   Socioeconomic History   Marital status: Significant Other    Spouse name: Not on file   Number of children: Not on file   Years of education: Not on file   Highest education level: Not on file  Occupational History   Not on file  Tobacco Use   Smoking status: Never   Smokeless tobacco: Never  Vaping Use   Vaping status: Never Used  Substance and Sexual Activity   Alcohol use: No   Drug use: Yes    Types: Marijuana   Sexual activity: Yes  Other Topics Concern   Not on file  Social History Narrative   Not on file   Social Drivers of Health   Financial Resource Strain: Not on file  Food Insecurity: Not on file  Transportation Needs: Not on file  Physical Activity: Not on file  Stress: Not on file  Social Connections: Unknown (08/18/2021)   Received from [facility]    Social Network    Social Network: Not on file  Intimate Partner Violence: Unknown (07/09/2021)   Received from [facility]   HITS    Physically Hurt: Not on file    Insult or Talk Down To: Not on file    Threaten Physical Harm: Not on file    Scream or Curse: Not on file    Review of Systems:  All other review of systems negative except as mentioned in the HPI.  Physical Exam: Vital signs BP (!) 157/91 (Cuff Size:  Normal)   Pulse (!) 59   Temp 98 F (36.7 C)   Ht 5' 11 (1.803 m)   Wt 292 lb (132.5 kg)   SpO2 98%   BMI 40.73 kg/m   General:   Alert,  Well-developed, well-nourished, pleasant and cooperative in NAD Airway:  Mallampati 3 Lungs:  Clear throughout to auscultation.   Heart:  Regular rate and rhythm; no murmurs, clicks, rubs,  or gallops. Abdomen:  Soft, nontender and nondistended. Normal bowel sounds.   Neuro/Psych:  Normal mood and affect. A and O x 3   Roselinda Bahena E. Stacia, MD [facility]

## 2023-10-27 NOTE — Progress Notes (Signed)
 Report to PACU, RN, vss, BBS= Clear.

## 2023-10-27 NOTE — Op Note (Signed)
 [facility] Patient Name: Ernest Miller Procedure Date: 10/27/2023 7:06 AM MRN: [id number] Endoscopist: Glendia E. Stacia , MD, [id number] Age: 48 Referring MD:  Date of Birth: [birth year] Gender: Male Account #: [id number] Procedure:                Colonoscopy Indications:              Screening for colorectal malignant neoplasm, This                            is the patient's first colonoscopy Medicines:                Monitored Anesthesia Care Procedure:                Pre-Anesthesia Assessment:                           - Prior to the procedure, a History and Physical                            was performed, and patient medications and                            allergies were reviewed. The patient's tolerance of                            previous anesthesia was also reviewed. The risks                            and benefits of the procedure and the sedation                            options and risks were discussed with the patient.                            All questions were answered, and informed consent                            was obtained. Prior Anticoagulants: The patient has                            taken no anticoagulant or antiplatelet agents. ASA                            Grade Assessment: III - A patient with severe                            systemic disease. After reviewing the risks and                            benefits, the patient was deemed in satisfactory                            condition to undergo the procedure.  After obtaining informed consent, the colonoscope                            was passed under direct vision. Throughout the                            procedure, the patient's blood pressure, pulse, and                            oxygen saturations were monitored continuously. The                            Olympus Scope [id number] was introduced through the                            anus and  advanced to the the terminal ileum, with                            identification of the appendiceal orifice and IC                            valve. The colonoscopy was performed without                            difficulty. The patient tolerated the procedure                            well. The quality of the bowel preparation was                            good. The terminal ileum, ileocecal valve,                            appendiceal orifice, and rectum were photographed.                            The bowel preparation used was SUPREP via split                            dose instruction. Scope In: 7:58:49 AM Scope Out: 8:15:19 AM Scope Withdrawal Time: 0 hours 14 minutes 12 seconds  Total Procedure Duration: 0 hours 16 minutes 30 seconds  Findings:                 The perianal and digital rectal examinations were                            normal. Pertinent negatives include normal                            sphincter tone and no palpable rectal lesions.                            There were numerous warty-like lesions in the groin  bilaterally. These did not abut the anus                           Two flat and semi-pedunculated polyps were found in                            the transverse colon. The polyps were 2 to 8 mm in                            size. These polyps were removed with a cold snare.                            Resection and retrieval were complete. Estimated                            blood loss was minimal.                           Two sessile polyps were found in the sigmoid colon.                            The polyps were 3 mm in size. These polyps were                            removed with a cold snare. Resection and retrieval                            were complete. Estimated blood loss was minimal.                           Many medium-mouthed and small-mouthed diverticula                            were found in the  sigmoid colon and descending                            colon.                           The exam was otherwise normal throughout the                            examined colon.                           The terminal ileum appeared normal.                           The retroflexed view of the distal rectum and anal                            verge was normal and showed no anal or rectal  abnormalities. Complications:            No immediate complications. Estimated Blood Loss:     Estimated blood loss was minimal. Impression:               - Two 2 to 8 mm polyps in the transverse colon,                            removed with a cold snare. Resected and retrieved.                           - Two 3 mm polyps in the sigmoid colon, removed                            with a cold snare. Resected and retrieved.                           - Moderate diverticulosis in the sigmoid colon and                            in the descending colon.                           - The examined portion of the ileum was normal.                           - The distal rectum and anal verge are normal on                            retroflexion view. Recommendation:           - Patient has a contact number available for                            emergencies. The signs and symptoms of potential                            delayed complications were discussed with the                            patient. Return to normal activities tomorrow.                            Written discharge instructions were provided to the                            patient.                           - Resume previous diet.                           - Continue present medications.                           - Await pathology results.                           -  Repeat colonoscopy (date not yet determined) for                            surveillance based on pathology results. Arrin Pintor E. Stacia, MD 10/27/2023  8:24:17 AM This report has been signed electronically.

## 2023-10-27 NOTE — Progress Notes (Signed)
 Called to room to assist during endoscopic procedure.  Patient ID and intended procedure confirmed with present staff. Received instructions for my participation in the procedure from the performing physician.

## 2023-10-28 ENCOUNTER — Telehealth: Payer: Self-pay | Admitting: *Deleted

## 2023-10-28 NOTE — Telephone Encounter (Signed)
  Follow up Call-     10/27/2023    7:23 AM  Call back number  Post procedure Call Back phone  # [phone]  Permission to leave phone message Yes     Patient questions:  Do you have a fever, pain , or abdominal swelling? No. Pain Score  0 *  Have you tolerated food without any problems? Yes.    Have you been able to return to your normal activities? Yes.    Do you have any questions about your discharge instructions: Diet   No. Medications  No. Follow up visit  No.  Do you have questions or concerns about your Care? Yes.    Pt stated he has some abdominal soreness.  Education provided that some soreness is normal but if does not improve, to notify our office.  Pt also stated that he is nauseated, no vomiting, and is able to consume food.  Advised pt to call back if nausea doesn't improve, vomiting, and/or unable to consume food.  Pt verbalized understanding.  Actions: * If pain score is 4 or above: No action needed, pain <4.

## 2023-10-29 ENCOUNTER — Other Ambulatory Visit: Payer: Self-pay | Admitting: Family

## 2023-10-29 DIAGNOSIS — N529 Male erectile dysfunction, unspecified: Secondary | ICD-10-CM

## 2023-10-31 LAB — SURGICAL PATHOLOGY

## 2023-11-07 ENCOUNTER — Ambulatory Visit: Payer: Self-pay | Admitting: Gastroenterology

## 2023-11-07 NOTE — Progress Notes (Signed)
 Ernest Miller,  Two polyps which I removed during your recent procedure were proven to be completely benign but are considered pre-cancerous polyps that MAY have grown into cancer if they had not been removed.  The other two polyps were not precancerous.  Studies shows that at least 20% of women over age 50 and 30% of men over age 69 have pre-cancerous polyps.  Based on current nationally recognized surveillance guidelines, I recommend that you have a repeat colonoscopy in 7 years.   If you develop any new rectal bleeding, abdominal pain or significant bowel habit changes, please contact me before then.

## 2023-12-02 ENCOUNTER — Ambulatory Visit: Admitting: Family

## 2023-12-02 ENCOUNTER — Encounter: Payer: Self-pay | Admitting: Family

## 2023-12-02 VITALS — BP 146/89 | HR 73 | Temp 97.0°F | Ht 71.0 in | Wt 294.6 lb

## 2023-12-02 DIAGNOSIS — F411 Generalized anxiety disorder: Secondary | ICD-10-CM

## 2023-12-02 DIAGNOSIS — E785 Hyperlipidemia, unspecified: Secondary | ICD-10-CM

## 2023-12-02 DIAGNOSIS — I152 Hypertension secondary to endocrine disorders: Secondary | ICD-10-CM

## 2023-12-02 DIAGNOSIS — E1159 Type 2 diabetes mellitus with other circulatory complications: Secondary | ICD-10-CM | POA: Diagnosis not present

## 2023-12-02 DIAGNOSIS — Z7985 Long-term (current) use of injectable non-insulin antidiabetic drugs: Secondary | ICD-10-CM

## 2023-12-02 DIAGNOSIS — Z789 Other specified health status: Secondary | ICD-10-CM

## 2023-12-02 DIAGNOSIS — K219 Gastro-esophageal reflux disease without esophagitis: Secondary | ICD-10-CM | POA: Diagnosis not present

## 2023-12-02 DIAGNOSIS — M7661 Achilles tendinitis, right leg: Secondary | ICD-10-CM | POA: Diagnosis not present

## 2023-12-02 DIAGNOSIS — G720 Drug-induced myopathy: Secondary | ICD-10-CM | POA: Diagnosis not present

## 2023-12-02 DIAGNOSIS — E1169 Type 2 diabetes mellitus with other specified complication: Secondary | ICD-10-CM

## 2023-12-02 DIAGNOSIS — N529 Male erectile dysfunction, unspecified: Secondary | ICD-10-CM

## 2023-12-02 MED ORDER — SILDENAFIL CITRATE 100 MG PO TABS: 50.0000 mg | ORAL_TABLET | Freq: Every day | ORAL | 3 refills | Status: AC | PRN

## 2023-12-02 MED ORDER — EZETIMIBE 10 MG PO TABS: 10.0000 mg | ORAL_TABLET | Freq: Every day | ORAL | 3 refills | Status: DC

## 2023-12-02 MED ORDER — TIRZEPATIDE 2.5 MG/0.5ML ~~LOC~~ SOAJ: 2.5000 mg | SUBCUTANEOUS | 0 refills | Status: DC

## 2023-12-02 NOTE — Patient Instructions (Addendum)
 Achilles Tendinitis  Achilles tendinitis is inflammation of the tough, cord-like band that connects the lower leg muscles to the heel bone (Achilles tendon). This is often caused by using the tendon and ankle joint too much. In most cases, Achilles tendinitis gets better over time with treatment and home care. It can take weeks or months to fully heal. What are the causes? This condition may be caused by: A sudden increase in exercise or activity, such as running. Doing the same exercises or activities, such as jumping, over and over. Not warming up your calf muscles before you exercise. Exercising in shoes that are worn out or not made for exercise. Having arthritis or a bone growth (spur) on the back of your heel. This can rub against the tendon and hurt it. Age-related wear and tear. Tendons become less flexible with age and are more likely to be injured. What are the signs or symptoms? Common symptoms of this condition include: Pain in your Achilles tendon or in the back of your leg, just above your heel. The pain may get worse when you exercise. Stiffness or soreness in the back of your leg. You may feel it most often in the morning. Swelling of the skin over the Achilles tendon. Thickening of the tendon. Trouble standing on tiptoe. How is this diagnosed? This condition is diagnosed based on your symptoms and a physical exam. You may also have tests, such as: X-rays. MRI. Ultrasound. How is this treated? The goal of treatment is to relieve symptoms and help your injury heal. You may need to: Decrease or stop activities that caused the tendinitis. You may be told to switch to low-impact exercises like biking or swimming. Ice the injured area. Do physical therapy. This may include strengthening and stretching exercises. Take NSAIDs, such as ibuprofen. These can help with pain and swelling. Use supportive shoes, wraps, heel lifts, or a walking boot (air cast). Have surgery. This may  be done if your symptoms do not get better with other treatments. Use high-energy waves to start the healing process (extracorporeal shock wave therapy). This is rare. Get an injection of medicines that help with inflammation (corticosteroids). This is rare. Follow these instructions at home: If you have an air cast: Wear the air cast as told by your health care provider. Remove it only as told by your provider. Check the skin around the air cast every day. Tell your provider about any concerns. Loosen the air cast if your toes tingle, become numb, or turn cold and blue. Keep the air cast clean. If the air cast is not waterproof: Do not let it get wet. Cover it with a watertight covering when you take a bath or shower. Managing pain, stiffness, and swelling  If told, put ice on the injured area. If you have a removable air cast, remove it as told by your provider. Put ice in a plastic bag. Place a towel between your skin and the bag. Leave the ice on for 20 minutes, 2-3 times a day. If your skin turns bright red, remove the ice right away to prevent skin damage. The risk of damage is higher if you cannot feel pain, heat, or cold. Move your toes often to reduce stiffness and swelling. Raise (elevate) your foot above the level of your heart while you are sitting or lying down. Activity Do not do activities that cause pain. Ask your provider when it is safe to drive if you have an air cast on your foot. If  you go to physical therapy, do exercises as told by your provider or therapist. Return to your normal activities as told by your provider. Ask your provider what activities are safe for you. General instructions Take over-the-counter and prescription medicines only as told by your provider. If told, wrap your foot with an elastic bandage or other wrap. This can help to keep your tendon from moving too much while it heals. Your provider will show you how to wrap your foot. Wear supportive  shoes or heel lifts only as told by your provider. Contact a health care provider if: Your symptoms get worse. Your pain does not get better with medicine. You have new symptoms that you cannot explain. You have warmth and swelling in your foot. You have a fever. Get help right away if: You hear a sudden popping sound in your Achilles tendon and then have severe pain. You cannot move your toes or foot. You cannot put any weight on your foot. Your foot or toes become numb and look white or blue even after you loosen your bandage or air cast. This information is not intended to replace advice given to you by your health care provider. Make sure you discuss any questions you have with your health care provider.  Achilles Tendinitis Rehab Ask your health care provider which exercises are safe for you. Do exercises exactly as told by your provider and adjust them as directed. It is normal to feel mild stretching, pulling, tightness, or discomfort as you do these exercises. Stop right away if you feel sudden pain or your pain gets worse. Do not begin these exercises until told by your provider. Stretching and range-of-motion exercises These exercises warm up your muscles and joints. They improve the movement and flexibility of your ankle. These exercises also help to relieve pain. Standing wall calf stretch with straight knee  Stand with your hands against a wall. Extend your left / right leg behind you, and bend your front knee slightly. Keep both of your heels on the floor. Point the toes of your back foot slightly inward. Keeping your heels on the floor and your back knee straight, shift your weight toward the wall. Do not let your back arch. You should feel a gentle stretch in your upper calf. Hold this position for __________ seconds. Repeat __________ times. Complete this exercise __________ times a day. Standing wall calf stretch with bent knee  Stand with your hands against a  wall. Extend your left / right leg behind you, and bend your front knee slightly. Keep both of your heels on the floor. Point the toes of your back foot slightly inward. Keeping your heels on the floor, bend your back knee slightly. You should feel a gentle stretch deep in your lower calf near your heel. Hold this position for __________ seconds. Repeat __________ times. Complete this exercise __________ times a day. Strengthening exercises These exercises build strength and control of your ankle. Endurance is the ability to use your muscles for a long time, even after they get tired. Plantar flexion with band In this exercise, you push your toes downward, away from you, with an exercise band providing resistance. Sit on the floor with your left / right leg extended. You may put a pillow under your calf to give your foot more room to move. Loop a rubber exercise band or tube around the ball of your left / right foot. The ball of your foot is on the walking surface, right under your  toes. The band or tube should be slightly tense when your foot is relaxed. If the band or tube slips, you can put on your shoe or put a washcloth between the band and your foot to help it stay in place. Slowly point your toes downward, pushing them away from you (plantar flexion). Hold this position for __________ seconds. Slowly release the tension in the band or tube, controlling smoothly until your foot is back to the starting position. Repeat steps 1-5 with your left / right leg. Repeat __________ times. Complete this exercise __________ times a day. Eccentric heel drop  In this exercise, you stand and slowly raise your heel and then slowly lower it. This exercise lengthens the calf muscles (eccentric) while the foot bears weight. If this exercise is too easy, try doing it while wearing a backpack with weights in it. Stand on a step with the balls of your feet. The ball of your foot is on the walking surface,  right under your toes. Do not put your heels on the step. For balance, rest your hands on the wall or on a railing. Rise up onto the balls of your feet. Keeping your heels up, shift all of your weight to your left / right leg and pick up your other leg. Slowly lower your left / right leg so your heel drops below the level of the step. Put down your other foot before going back to the start position. If told by your provider, build up to: 3 sets of 15 repetitions while keeping your knees straight. 3 sets of 15 repetitions while keeping your knees slightly bent as far as told by your provider. Repeat __________ times. Complete this exercise __________ times a day. Balance exercises These exercises improve or maintain your balance. Balance helps to prevent falls. Single leg stand If this exercise is too easy, you can try it with your eyes closed or while standing on a pillow. Without shoes, stand near a railing or in a doorframe. Hold on to the railing or doorframe as needed. Stand on your left / right foot. Keep your big toe down on the floor and try to keep your arch lifted. You should feel a stretch across the bottom of your foot and arch. Do not let your foot roll inward. Hold this position for __________ seconds. Repeat __________ times. Complete this exercise __________ times a day. This information is not intended to replace advice given to you by your health care provider. Make sure you discuss any questions you have with your health care provider. Document Revised: 12/29/2021 Document Reviewed: 12/29/2021 [employer] Patient Education  2024 [employer] Document Revised: 12/11/2021 Document Reviewed: 12/11/2021 [employer] Patient Education  2024 [employer].

## 2023-12-02 NOTE — Progress Notes (Signed)
 Subjective:    Patient ID: Ernest Miller, male    DOB: 31-Aug-1975, 48 y.o.   MRN: [id number]  Chief Complaint  Patient presents with   Medical Management of Chronic Issues    Right foot numbness and burning it is getting more taking motrin for it    PT presents to the office today for chronic follow up.   He is taking Mounjaro  2.5 mg for his diabetes. Complaining of right foot burning and numbness.   States the statins cause him myopathy.   He reports he is having a hard time maintaining erection. Takes Viagra  as needed.   Hypertension This is a chronic problem. The current episode started more than 1 year ago. The problem has been waxing and waning since onset. The problem is uncontrolled. Associated symptoms include anxiety and peripheral edema (some times slighlty). Pertinent negatives include no blurred vision, malaise/fatigue or shortness of breath. Risk factors for coronary artery disease include dyslipidemia, diabetes mellitus, obesity and male gender. Past treatments include ACE inhibitors. The current treatment provides moderate improvement.  Gastroesophageal Reflux He complains of belching, heartburn and a hoarse voice. This is a chronic problem. The current episode started more than 1 year ago. The problem occurs occasionally. The symptoms are aggravated by certain foods. Risk factors include obesity. He has tried a PPI for the symptoms. The treatment provided moderate relief.  Hyperlipidemia This is a chronic problem. The current episode started more than 1 year ago. The problem is uncontrolled. Recent lipid tests were reviewed and are high. Exacerbating diseases include obesity. Pertinent negatives include no shortness of breath. Current antihyperlipidemic treatment includes diet change. The current treatment provides no improvement of lipids. Risk factors for coronary artery disease include dyslipidemia, hypertension, diabetes mellitus, male sex and a sedentary lifestyle.   Diabetes He presents for his follow-up diabetic visit. He has type 2 diabetes mellitus. Hypoglycemia symptoms include nervousness/anxiousness. Pertinent negatives for diabetes include no blurred vision and no foot paresthesias. Symptoms are stable. Risk factors for coronary artery disease include dyslipidemia, diabetes mellitus, hypertension, sedentary lifestyle and post-menopausal. He is following a generally unhealthy diet. His overall blood glucose range is 110-130 mg/dl. Eye exam is not current.  Anxiety Presents for follow-up visit. Symptoms include excessive worry, nervous/anxious behavior and obsessions. Patient reports no shortness of breath. Symptoms occur occasionally. The severity of symptoms is moderate.        Review of Systems  Constitutional:  Negative for malaise/fatigue.  HENT:  Positive for hoarse voice.   Eyes:  Negative for blurred vision.  Respiratory:  Negative for shortness of breath.   Gastrointestinal:  Positive for heartburn.  Psychiatric/Behavioral:  The patient is nervous/anxious.   All other systems reviewed and are negative.  Family History  Problem Relation Age of Onset   Colon cancer Neg Hx    Colon polyps Neg Hx    Rectal cancer Neg Hx    Stomach cancer Neg Hx    Esophageal cancer Neg Hx    Social History   Socioeconomic History   Marital status: Significant Other    Spouse name: Not on file   Number of children: Not on file   Years of education: Not on file   Highest education level: Not on file  Occupational History   Not on file  Tobacco Use   Smoking status: Never   Smokeless tobacco: Never  Vaping Use   Vaping status: Never Used  Substance and Sexual Activity   Alcohol use: No  Drug use: Yes    Types: Marijuana   Sexual activity: Yes  Other Topics Concern   Not on file  Social History Narrative   Not on file   Social Drivers of Health   Financial Resource Strain: Not on file  Food Insecurity: Not on file  Transportation  Needs: Not on file  Physical Activity: Not on file  Stress: Not on file  Social Connections: Unknown (08/18/2021)   Received from [facility]   Social Network    Social Network: Not on file       Objective:   Physical Exam Vitals reviewed.  Constitutional:      General: He is not in acute distress.    Appearance: He is well-developed. He is obese.  HENT:     Head: Normocephalic.     Right Ear: Tympanic membrane normal.     Left Ear: Tympanic membrane normal.  Eyes:     General:        Right eye: No discharge.        Left eye: No discharge.     Pupils: Pupils are equal, round, and reactive to light.  Neck:     Thyroid: No thyromegaly.  Cardiovascular:     Rate and Rhythm: Normal rate and regular rhythm.     Heart sounds: Normal heart sounds. No murmur heard. Pulmonary:     Effort: Pulmonary effort is normal. No respiratory distress.     Breath sounds: Normal breath sounds. No wheezing.  Abdominal:     General: Bowel sounds are normal. There is no distension.     Palpations: Abdomen is soft.     Tenderness: There is no abdominal tenderness.  Musculoskeletal:        General: No tenderness. Normal range of motion.     Cervical back: Normal range of motion and neck supple.       Legs:     Comments: Right achillis tenderness with palpation   Skin:    General: Skin is warm and dry.     Findings: No erythema or rash.  Neurological:     Mental Status: He is alert and oriented to person, place, and time.     Cranial Nerves: No cranial nerve deficit.     Deep Tendon Reflexes: Reflexes are normal and symmetric.  Psychiatric:        Behavior: Behavior normal.        Thought Content: Thought content normal.        Judgment: Judgment normal.       BP (!) 143/84   Pulse 73   Temp (!) 97 F (36.1 C) (Temporal)   Ht 5' 11 (1.803 m)   Wt 294 lb 9.6 oz (133.6 kg)   SpO2 97%   BMI 41.09 kg/m      Assessment & Plan:  ASA BAUDOIN comes in today with chief  complaint of Medical Management of Chronic Issues (Right foot numbness and burning it is getting more taking motrin for it )   Diagnosis and orders addressed: 1. Medication intolerance - tirzepatide  (MOUNJARO ) 2.5 MG/0.5ML Pen; Inject 2.5 mg into the skin once a week.  Dispense: 2 mL; Refill: 0 - CMP14+EGFR - CBC with Differential/Platelet  2. Type 2 diabetes mellitus with other specified complication, without long-term current use of insulin  (HCC) - tirzepatide  (MOUNJARO ) 2.5 MG/0.5ML Pen; Inject 2.5 mg into the skin once a week.  Dispense: 2 mL; Refill: 0 - Bayer DCA Hb A1c Waived - CMP14+EGFR - CBC  with Differential/Platelet  3. Morbid obesity (HCC) - tirzepatide  (MOUNJARO ) 2.5 MG/0.5ML Pen; Inject 2.5 mg into the skin once a week.  Dispense: 2 mL; Refill: 0 - CMP14+EGFR - CBC with Differential/Platelet  4. Hyperlipidemia associated with type 2 diabetes mellitus (HCC) - tirzepatide  (MOUNJARO ) 2.5 MG/0.5ML Pen; Inject 2.5 mg into the skin once a week.  Dispense: 2 mL; Refill: 0 - CMP14+EGFR - CBC with Differential/Platelet - ezetimibe  (ZETIA ) 10 MG tablet; Take 1 tablet (10 mg total) by mouth daily.  Dispense: 90 tablet; Refill: 3  5. Hypertension associated with diabetes (HCC) - tirzepatide  (MOUNJARO ) 2.5 MG/0.5ML Pen; Inject 2.5 mg into the skin once a week.  Dispense: 2 mL; Refill: 0 - CMP14+EGFR - CBC with Differential/Platelet  6. GAD (generalized anxiety disorder) (Primary)  - CMP14+EGFR - CBC with Differential/Platelet  7. Gastroesophageal reflux disease without esophagitis - CMP14+EGFR - CBC with Differential/Platelet  8. Statin myopathy - CMP14+EGFR - CBC with Differential/Platelet  9. Erectile dysfunction, unspecified erectile dysfunction type - sildenafil  (VIAGRA ) 100 MG tablet; Take 0.5-1 tablets (50-100 mg total) by mouth daily as needed.  Dispense: 30 tablet; Refill: 3  10. Achilles tendinitis of right lower extremity Rest Taking Motrin  ROM  exercises- handout given   Labs pending Take motrin and ROM of right ankle. If pain continues will send in prednisone .  Will start Zetia  since can not tolerate statin Continue current medications  Health Maintenance reviewed Diet and exercise encouraged  Follow up plan: 3 months   Bari Learn, FNP

## 2023-12-06 ENCOUNTER — Telehealth: Payer: Self-pay | Admitting: Family

## 2023-12-06 MED ORDER — PREDNISONE 10 MG (21) PO TBPK: ORAL_TABLET | ORAL | 0 refills | Status: DC

## 2023-12-06 NOTE — Telephone Encounter (Signed)
 Copied from CRM #[id number]. Topic: Clinical - Medication Refill >> Dec 06, 2023 12:15 PM Ahlexyia S wrote: Medication: MED NOT LISTED  Has the patient contacted their pharmacy? No, pt was told to contact office to refill by PCP (Agent: If no, request that the patient contact the pharmacy for the refill. If patient does not wish to contact the pharmacy document the reason why and proceed with request.) (Agent: If yes, when and what did the pharmacy advise?)  This is the patient's preferred pharmacy:  [id number] - [place], [place] - [address] [place] [address] Phone: [phone] Fax: [phone]  Is this the correct pharmacy for this prescription? Yes If no, delete pharmacy and type the correct one.   Has the prescription been filled recently? No  Is the patient out of the medication? Yes, previous med that pt was taking  Has the patient been seen for an appointment in the last year OR does the patient have an upcoming appointment? Yes  Can we respond through MyChart? No, prefers phone calls.  Agent: Please be advised that Rx refills may take up to 3 business days. We ask that you follow-up with your pharmacy.

## 2023-12-06 NOTE — Telephone Encounter (Signed)
 Prednisone Prescription sent to pharmacy

## 2023-12-06 NOTE — Telephone Encounter (Signed)
 Patient aware

## 2023-12-06 NOTE — Telephone Encounter (Signed)
 Called and spoke with patient with the number that is listed and it did go through. He wants medication they you gave him last time for his back that yall discussed a steroid he [facility] [place]

## 2024-01-25 ENCOUNTER — Other Ambulatory Visit: Payer: Self-pay | Admitting: Family

## 2024-01-25 DIAGNOSIS — E1169 Type 2 diabetes mellitus with other specified complication: Secondary | ICD-10-CM

## 2024-01-25 DIAGNOSIS — Z789 Other specified health status: Secondary | ICD-10-CM

## 2024-01-25 DIAGNOSIS — E1159 Type 2 diabetes mellitus with other circulatory complications: Secondary | ICD-10-CM

## 2024-01-26 NOTE — Telephone Encounter (Signed)
 Christy NTBS in Nov for 3 mos FU RF sent to pharmacy

## 2024-01-26 NOTE — Telephone Encounter (Signed)
 Appt scheduled for 03/12/2024

## 2024-02-23 ENCOUNTER — Encounter: Payer: Self-pay | Admitting: Family

## 2024-02-23 ENCOUNTER — Ambulatory Visit: Admitting: Family

## 2024-02-23 VITALS — BP 146/80 | HR 66 | Temp 98.0°F | Ht 71.0 in | Wt 298.0 lb

## 2024-02-23 DIAGNOSIS — K219 Gastro-esophageal reflux disease without esophagitis: Secondary | ICD-10-CM

## 2024-02-23 DIAGNOSIS — E1159 Type 2 diabetes mellitus with other circulatory complications: Secondary | ICD-10-CM

## 2024-02-23 DIAGNOSIS — E1169 Type 2 diabetes mellitus with other specified complication: Secondary | ICD-10-CM | POA: Diagnosis not present

## 2024-02-23 DIAGNOSIS — E785 Hyperlipidemia, unspecified: Secondary | ICD-10-CM | POA: Diagnosis not present

## 2024-02-23 DIAGNOSIS — F411 Generalized anxiety disorder: Secondary | ICD-10-CM

## 2024-02-23 DIAGNOSIS — I152 Hypertension secondary to endocrine disorders: Secondary | ICD-10-CM | POA: Diagnosis not present

## 2024-02-23 LAB — CMP14+EGFR
ALT: 25 IU/L (ref 0–44)
AST: 23 IU/L (ref 0–40)
Albumin: 4.5 g/dL (ref 4.1–5.1)
Alkaline Phosphatase: 75 IU/L (ref 47–123)
BUN/Creatinine Ratio: 17 (ref 9–20)
BUN: 19 mg/dL (ref 6–24)
Bilirubin Total: 0.4 mg/dL (ref 0.0–1.2)
CO2: 24 mmol/L (ref 20–29)
Calcium: 10.1 mg/dL (ref 8.7–10.2)
Chloride: 102 mmol/L (ref 96–106)
Creatinine, Ser: 1.13 mg/dL (ref 0.76–1.27)
Globulin, Total: 2.7 g/dL (ref 1.5–4.5)
Glucose: 109 mg/dL — ABNORMAL HIGH (ref 70–99)
Potassium: 5.6 mmol/L — ABNORMAL HIGH (ref 3.5–5.2)
Sodium: 138 mmol/L (ref 134–144)
Total Protein: 7.2 g/dL (ref 6.0–8.5)
eGFR: 79 mL/min/1.73 (ref >59)

## 2024-02-23 LAB — BAYER DCA HB A1C WAIVED: HB A1C (BAYER DCA - WAIVED): 6 % — ABNORMAL HIGH (ref 4.8–5.6)

## 2024-02-23 MED ORDER — BUSPIRONE HCL 5 MG PO TABS: 5.0000 mg | ORAL_TABLET | Freq: Two times a day (BID) | ORAL | 2 refills | Status: DC

## 2024-02-23 MED ORDER — TIRZEPATIDE 5 MG/0.5ML ~~LOC~~ SOAJ: 5.0000 mg | SUBCUTANEOUS | 3 refills | Status: DC

## 2024-02-23 NOTE — Progress Notes (Signed)
 Subjective:    Patient ID: Ernest Miller, male    DOB: 1976/03/10, 48 y.o.   MRN: [id number]  Chief Complaint  Patient presents with   Medical Management of Chronic Issues   PT presents to the office today for chronic follow up.   He is taking Mounjaro  2.5 mg for his diabetes. Complaining of right foot burning and numbness.      02/23/2024    8:03 AM 12/02/2023    9:40 AM 10/27/2023    7:09 AM  Last 3 Weights  Weight (lbs) 298 lb 294 lb 9.6 oz 292 lb  Weight (kg) 135.172 kg 133.63 kg 132.45 kg      States the statins cause him myopathy.   He reports he is having a hard time maintaining erection. Takes Viagra  as needed.   Hypertension This is a chronic problem. The current episode started more than 1 year ago. The problem has been waxing and waning since onset. The problem is uncontrolled. Associated symptoms include anxiety and peripheral edema (some times slighlty). Pertinent negatives include no blurred vision, malaise/fatigue or shortness of breath. Risk factors for coronary artery disease include dyslipidemia, diabetes mellitus, obesity, male gender and sedentary lifestyle. Past treatments include ACE inhibitors. The current treatment provides moderate improvement.  Gastroesophageal Reflux He complains of belching, heartburn and a hoarse voice. This is a chronic problem. The current episode started more than 1 year ago. The problem occurs occasionally. The symptoms are aggravated by certain foods. Risk factors include obesity. He has tried a PPI for the symptoms. The treatment provided moderate relief.  Hyperlipidemia This is a chronic problem. The current episode started more than 1 year ago. The problem is uncontrolled. Recent lipid tests were reviewed and are high. Exacerbating diseases include obesity. Pertinent negatives include no shortness of breath. Current antihyperlipidemic treatment includes diet change. The current treatment provides no improvement of lipids. Risk  factors for coronary artery disease include dyslipidemia, hypertension, diabetes mellitus, male sex and a sedentary lifestyle.  Diabetes He presents for his follow-up diabetic visit. He has type 2 diabetes mellitus. Hypoglycemia symptoms include nervousness/anxiousness. Pertinent negatives for diabetes include no blurred vision and no foot paresthesias. Symptoms are stable. Risk factors for coronary artery disease include dyslipidemia, diabetes mellitus, hypertension, sedentary lifestyle and post-menopausal. He is following a generally unhealthy diet. His overall blood glucose range is 110-130 mg/dl. Eye exam is not current.  Anxiety Presents for follow-up visit. Symptoms include excessive worry, nervous/anxious behavior and obsessions. Patient reports no shortness of breath. Symptoms occur occasionally. The severity of symptoms is moderate.        Review of Systems  Constitutional:  Negative for malaise/fatigue.  HENT:  Positive for hoarse voice.   Eyes:  Negative for blurred vision.  Respiratory:  Negative for shortness of breath.   Gastrointestinal:  Positive for heartburn.  Psychiatric/Behavioral:  The patient is nervous/anxious.   All other systems reviewed and are negative.  Family History  Problem Relation Age of Onset   Colon cancer Neg Hx    Colon polyps Neg Hx    Rectal cancer Neg Hx    Stomach cancer Neg Hx    Esophageal cancer Neg Hx    Social History   Socioeconomic History   Marital status: Significant Other    Spouse name: Not on file   Number of children: Not on file   Years of education: Not on file   Highest education level: Not on file  Occupational History   Not on  file  Tobacco Use   Smoking status: Never   Smokeless tobacco: Never  Vaping Use   Vaping status: Never Used  Substance and Sexual Activity   Alcohol use: No   Drug use: Yes    Types: Marijuana   Sexual activity: Yes  Other Topics Concern   Not on file  Social History Narrative   Not  on file   Social Drivers of Health   Financial Resource Strain: Not on file  Food Insecurity: Not on file  Transportation Needs: Not on file  Physical Activity: Not on file  Stress: Not on file  Social Connections: Unknown (08/18/2021)   Received [facility]   Social Network    Social Network: Not on file       Objective:   Physical Exam Vitals reviewed.  Constitutional:      General: He is not in acute distress.    Appearance: He is well-developed. He is obese.  HENT:     Head: Normocephalic.     Right Ear: Tympanic membrane normal.     Left Ear: Tympanic membrane normal.  Eyes:     General:        Right eye: No discharge.        Left eye: No discharge.     Pupils: Pupils are equal, round, and reactive to light.  Neck:     Thyroid: No thyromegaly.  Cardiovascular:     Rate and Rhythm: Normal rate and regular rhythm.     Heart sounds: Normal heart sounds. No murmur heard. Pulmonary:     Effort: Pulmonary effort is normal. No respiratory distress.     Breath sounds: Normal breath sounds. No wheezing.  Abdominal:     General: Bowel sounds are normal. There is no distension.     Palpations: Abdomen is soft.     Tenderness: There is no abdominal tenderness.  Musculoskeletal:        General: No tenderness. Normal range of motion.     Cervical back: Normal range of motion and neck supple.  Skin:    General: Skin is warm and dry.     Findings: No erythema or rash.  Neurological:     Mental Status: He is alert and oriented to person, place, and time.     Cranial Nerves: No cranial nerve deficit.     Deep Tendon Reflexes: Reflexes are normal and symmetric.  Psychiatric:        Behavior: Behavior normal.        Thought Content: Thought content normal.        Judgment: Judgment normal.       BP (!) 146/80   Pulse 66   Temp 98 F (36.7 C) (Temporal)   Ht 5' 11 (1.803 m)   Wt 298 lb (135.2 kg)   SpO2 97%   BMI 41.56 kg/m      Assessment & Plan:   MASUD HOLUB comes in today with chief complaint of Medical Management of Chronic Issues   Diagnosis and orders addressed:  1. Type 2 diabetes mellitus with other specified complication, without long-term current use of insulin  (HCC) (Primary) Will increase Mounjaro  to 5 mg from 2.5 mg Low carb diet  - tirzepatide  (MOUNJARO ) 5 MG/0.5ML Pen; Inject 5 mg into the skin once a week.  Dispense: 6 mL; Refill: 3 - CMP14+EGFR - Bayer DCA Hb A1c Waived  2. GAD (generalized anxiety disorder) Will add Buspar  5 mg TID prn  Stress  management  -  CMP14+EGFR - busPIRone  (BUSPAR ) 5 MG tablet; Take 1 tablet (5 mg total) by mouth 2 (two) times daily.  Dispense: 90 tablet; Refill: 2  3. Gastroesophageal reflux disease without esophagitis - CMP14+EGFR  4. Hyperlipidemia associated with type 2 diabetes mellitus (HCC) - CMP14+EGFR  5. Hypertension associated with diabetes (HCC) - CMP14+EGFR  6. Morbid obesity (HCC) - CMP14+EGFR   Labs pending Will increase Mounjaro  to 5 mg from 2.5 mg Restart Enalapril  2.5 mg  Continue current medications  Health Maintenance reviewed Diet and exercise encouraged  Follow up plan: 3 months   Bari Learn, FNP

## 2024-02-23 NOTE — Patient Instructions (Signed)
 Hypertension, Adult High blood pressure (hypertension) is when the force of blood pumping through the arteries is too strong. The arteries are the blood vessels that carry blood from the heart throughout the body. Hypertension forces the heart to work harder to pump blood and may cause arteries to become narrow or stiff. Untreated or uncontrolled hypertension can lead to a heart attack, heart failure, a stroke, kidney disease, and other problems. A blood pressure reading consists of a higher number over a lower number. Ideally, your blood pressure should be below 120/80. The first ("top") number is called the systolic pressure. It is a measure of the pressure in your arteries as your heart beats. The second ("bottom") number is called the diastolic pressure. It is a measure of the pressure in your arteries as the heart relaxes. What are the causes? The exact cause of this condition is not known. There are some conditions that result in high blood pressure. What increases the risk? Certain factors may make you more likely to develop high blood pressure. Some of these risk factors are under your control, including: Smoking. Not getting enough exercise or physical activity. Being overweight. Having too much fat, sugar, calories, or salt (sodium) in your diet. Drinking too much alcohol. Other risk factors include: Having a personal history of heart disease, diabetes, high cholesterol, or kidney disease. Stress. Having a family history of high blood pressure and high cholesterol. Having obstructive sleep apnea. Age. The risk increases with age. What are the signs or symptoms? High blood pressure may not cause symptoms. Very high blood pressure (hypertensive crisis) may cause: Headache. Fast or irregular heartbeats (palpitations). Shortness of breath. Nosebleed. Nausea and vomiting. Vision changes. Severe chest pain, dizziness, and seizures. How is this diagnosed? This condition is diagnosed by  measuring your blood pressure while you are seated, with your arm resting on a flat surface, your legs uncrossed, and your feet flat on the floor. The cuff of the blood pressure monitor will be placed directly against the skin of your upper arm at the level of your heart. Blood pressure should be measured at least twice using the same arm. Certain conditions can cause a difference in blood pressure between your right and left arms. If you have a high blood pressure reading during one visit or you have normal blood pressure with other risk factors, you may be asked to: Return on a different day to have your blood pressure checked again. Monitor your blood pressure at home for 1 week or longer. If you are diagnosed with hypertension, you may have other blood or imaging tests to help your health care provider understand your overall risk for other conditions. How is this treated? This condition is treated by making healthy lifestyle changes, such as eating healthy foods, exercising more, and reducing your alcohol intake. You may be referred for counseling on a healthy diet and physical activity. Your health care provider may prescribe medicine if lifestyle changes are not enough to get your blood pressure under control and if: Your systolic blood pressure is above 130. Your diastolic blood pressure is above 80. Your personal target blood pressure may vary depending on your medical conditions, your age, and other factors. Follow these instructions at home: Eating and drinking  Eat a diet that is high in fiber and potassium, and low in sodium, added sugar, and fat. An example of this eating plan is called the DASH diet. DASH stands for Dietary Approaches to Stop Hypertension. To eat this way: Eat  plenty of fresh fruits and vegetables. Try to fill one half of your plate at each meal with fruits and vegetables. Eat whole grains, such as whole-wheat pasta, brown rice, or whole-grain bread. Fill about one  fourth of your plate with whole grains. Eat or drink low-fat dairy products, such as skim milk or low-fat yogurt. Avoid fatty cuts of meat, processed or cured meats, and poultry with skin. Fill about one fourth of your plate with lean proteins, such as fish, chicken without skin, beans, eggs, or tofu. Avoid pre-made and processed foods. These tend to be higher in sodium, added sugar, and fat. Reduce your daily sodium intake. Many people with hypertension should eat less than 1,500 mg of sodium a day. Do not drink alcohol if: Your health care provider tells you not to drink. You are pregnant, may be pregnant, or are planning to become pregnant. If you drink alcohol: Limit how much you have to: 0-1 drink a day for women. 0-2 drinks a day for men. Know how much alcohol is in your drink. In the [place] one drink equals one 12 oz bottle of beer (355 mL), one 5 oz glass of wine (148 mL), or one 1 oz glass of hard liquor (44 mL). Lifestyle  Work with your health care provider to maintain a healthy body weight or to lose weight. Ask what an ideal weight is for you. Get at least 30 minutes of exercise that causes your heart to beat faster (aerobic exercise) most days of the week. Activities may include walking, swimming, or biking. Include exercise to strengthen your muscles (resistance exercise), such as Pilates or lifting weights, as part of your weekly exercise routine. Try to do these types of exercises for 30 minutes at least 3 days a week. Do not use any products that contain nicotine or tobacco. These products include cigarettes, chewing tobacco, and vaping devices, such as e-cigarettes. If you need help quitting, ask your health care provider. Monitor your blood pressure at home as told by your health care provider. Keep all follow-up visits. This is important. Medicines Take over-the-counter and prescription medicines only as told by your health care provider. Follow directions carefully. Blood  pressure medicines must be taken as prescribed. Do not skip doses of blood pressure medicine. Doing this puts you at risk for problems and can make the medicine less effective. Ask your health care provider about side effects or reactions to medicines that you should watch for. Contact a health care provider if you: Think you are having a reaction to a medicine you are taking. Have headaches that keep coming back (recurring). Feel dizzy. Have swelling in your ankles. Have trouble with your vision. Get help right away if you: Develop a severe headache or confusion. Have unusual weakness or numbness. Feel faint. Have severe pain in your chest or abdomen. Vomit repeatedly. Have trouble breathing. These symptoms may be an emergency. Get help right away. Call 911. Do not wait to see if the symptoms will go away. Do not drive yourself to the hospital. Summary Hypertension is when the force of blood pumping through your arteries is too strong. If this condition is not controlled, it may put you at risk for serious complications. Your personal target blood pressure may vary depending on your medical conditions, your age, and other factors. For most people, a normal blood pressure is less than 120/80. Hypertension is treated with lifestyle changes, medicines, or a combination of both. Lifestyle changes include losing weight, eating a healthy,  low-sodium diet, exercising more, and limiting alcohol. This information is not intended to replace advice given to you by your health care provider. Make sure you discuss any questions you have with your health care provider. Document Revised: 01/27/2021 Document Reviewed: 01/27/2021 Elsevier Patient Education  2024 [employer].

## 2024-02-24 ENCOUNTER — Ambulatory Visit: Payer: Self-pay | Admitting: Family

## 2024-02-24 DIAGNOSIS — E875 Hyperkalemia: Secondary | ICD-10-CM

## 2024-02-27 ENCOUNTER — Other Ambulatory Visit

## 2024-02-27 DIAGNOSIS — E875 Hyperkalemia: Secondary | ICD-10-CM | POA: Diagnosis not present

## 2024-02-27 LAB — BMP8+EGFR
BUN/Creatinine Ratio: 12 (ref 9–20)
BUN: 14 mg/dL (ref 6–24)
CO2: 24 mmol/L (ref 20–29)
Calcium: 9.9 mg/dL (ref 8.7–10.2)
Chloride: 102 mmol/L (ref 96–106)
Creatinine, Ser: 1.15 mg/dL (ref 0.76–1.27)
Glucose: 117 mg/dL — ABNORMAL HIGH (ref 70–99)
Potassium: 4.8 mmol/L (ref 3.5–5.2)
Sodium: 140 mmol/L (ref 134–144)
eGFR: 78 mL/min/1.73 (ref >59)

## 2024-02-28 ENCOUNTER — Ambulatory Visit: Payer: Self-pay | Admitting: Family

## 2024-03-06 ENCOUNTER — Other Ambulatory Visit: Payer: Self-pay | Admitting: Family

## 2024-03-06 DIAGNOSIS — E1169 Type 2 diabetes mellitus with other specified complication: Secondary | ICD-10-CM

## 2024-03-06 DIAGNOSIS — Z789 Other specified health status: Secondary | ICD-10-CM

## 2024-03-06 DIAGNOSIS — E1159 Type 2 diabetes mellitus with other circulatory complications: Secondary | ICD-10-CM

## 2024-03-07 ENCOUNTER — Telehealth: Payer: Self-pay

## 2024-03-07 NOTE — Telephone Encounter (Signed)
 Copied from CRM #[id number]. Topic: Clinical - Prescription Issue >> Mar 07, 2024  2:58 PM Avram MATSU wrote: Reason for CRM: patient stated his insurance did not cover 5mg  so he went back to 2.5mg  and stated the insurance would not cover that either. Please advise [phone] (M)  tirzepatide  (MOUNJARO ) 5 MG/0.5ML Pen [[id number]]

## 2024-03-08 ENCOUNTER — Other Ambulatory Visit: Payer: Self-pay | Admitting: Nurse Practitioner

## 2024-03-08 MED ORDER — TIRZEPATIDE 2.5 MG/0.5ML ~~LOC~~ SOAJ: 2.5000 mg | SUBCUTANEOUS | 1 refills | Status: DC

## 2024-03-08 MED ORDER — TIRZEPATIDE 2.5 MG/0.5ML ~~LOC~~ SOAJ: 2.5000 mg | SUBCUTANEOUS | 1 refills | Status: AC

## 2024-03-08 NOTE — Telephone Encounter (Signed)
 Looks like last dosage required PA before covering so new dose will most likely need PA approval too. Will forward to our PA team.

## 2024-03-08 NOTE — Telephone Encounter (Signed)
 Copied from CRM #[id number]. Topic: Clinical - Medication Question >> Mar 08, 2024  9:22 AM Cherylann RAMAN wrote: Reason for CRM: Patient is requesting something to be prescribed until he is able to get his shots approved. Patient also suggest that since the prior medication was approved, is he able to get [facility] Please advise. Patient can be contacted at [phone] for additional information.

## 2024-03-08 NOTE — Telephone Encounter (Signed)
 I called and spoke with patient and he says he was told to ask if he could stay on the 2.5 mg since PA has already been approved for it. Says a new PA for the 5.0 dosage will take too long and patient needs medicine now because he has already been almost 3 whole weeks without it and his sugar has been running in the 200's. PCP is out of office until Monday. Can covering provider advise on this or does PCP have to approve?

## 2024-03-12 ENCOUNTER — Ambulatory Visit: Payer: Self-pay

## 2024-03-12 NOTE — Progress Notes (Addendum)
 Ernest Miller                                          MRN: [id number]   03/12/2024   The [facility] Quality Team Specialist reviewed this patient medical record for the purposes of chart review for care gap closure. The following were reviewed: chart review for care gap closure-controlling blood pressure.ABSTRACT REVIEW FOR DIABETIC EYE EXAM.    [facility] Quality Team

## 2024-03-15 ENCOUNTER — Telehealth: Payer: Self-pay | Admitting: Pharmacy Technician

## 2024-03-15 ENCOUNTER — Other Ambulatory Visit (HOSPITAL_COMMUNITY): Payer: Self-pay

## 2024-03-15 NOTE — Telephone Encounter (Signed)
 Pharmacy Patient Advocate Encounter   Received notification from Pt Calls Messages that prior authorization for Mounjaro  5MG /0.5ML auto-injectors is required/requested.   Insurance verification completed.   The patient is insured through [facility].   Per test claim: PA required; PA started via CoverMyMeds. KEY [id number] . Waiting for clinical questions to populate.

## 2024-03-15 NOTE — Telephone Encounter (Signed)
 PA start noted

## 2024-03-16 ENCOUNTER — Other Ambulatory Visit: Payer: Self-pay | Admitting: Family

## 2024-03-16 ENCOUNTER — Other Ambulatory Visit (HOSPITAL_COMMUNITY): Payer: Self-pay

## 2024-03-16 DIAGNOSIS — F411 Generalized anxiety disorder: Secondary | ICD-10-CM

## 2024-03-16 NOTE — Telephone Encounter (Signed)
 Pharmacy Patient Advocate Encounter  Received notification from [facility] that Prior Authorization for Mounjaro  5MG /0.5ML auto-injectors has been closed     PA #/Case ID/Reference #: [id number]  **After a little research his mounjaro  PA is actually good until 09/08/24 and his approval letter is in his media tab, it looks like he picked his med up 03/11/24 from his pharmacy**

## 2024-04-20 ENCOUNTER — Telehealth: Payer: Self-pay | Admitting: Family

## 2024-04-20 ENCOUNTER — Other Ambulatory Visit (HOSPITAL_COMMUNITY): Payer: Self-pay

## 2024-04-20 NOTE — Telephone Encounter (Unsigned)
 Copied from CRM [id number]. Topic: Clinical - Medication Refill >> Apr 20, 2024 10:34 AM Rosaria BRAVO wrote: Pt needs prior auth, has recently changed his insurance. [employer]   Medication:  tirzepatide  (MOUNJARO ) 2.5 MG/0.5ML Pen   Has the patient contacted their pharmacy? Yes (Agent: If no, request that the patient contact the pharmacy for the refill. If patient does not wish to contact the pharmacy document the reason why and proceed with request.) (Agent: If yes, when and what did the pharmacy advise?)  This is the patient's preferred pharmacy:  [id number] [place], [place] - [address] [place] [address] Phone: [phone] Fax: [phone]  Is this the correct pharmacy for this prescription? Yes If no, delete pharmacy and type the correct one.   Has the prescription been filled recently? Yes  Is the patient out of the medication? Yes  Has the patient been seen for an appointment in the last year OR does the patient have an upcoming appointment? Yes  Can we respond through MyChart? Yes  Agent: Please be advised that Rx refills may take up to 3 business days. We ask that you follow-up with your pharmacy.

## 2024-04-23 ENCOUNTER — Other Ambulatory Visit (HOSPITAL_COMMUNITY): Payer: Self-pay

## 2024-04-23 ENCOUNTER — Telehealth: Payer: Self-pay

## 2024-04-23 NOTE — Telephone Encounter (Signed)
 Pharmacy Patient Advocate Encounter   Received notification from Physician's Office that prior authorization for Mounjaro  2.5MG /0.5ML auto-injectors is required/requested.   Insurance verification completed.   The patient is insured through [facility] [employer].   Per test claim: PA required; PA submitted to above mentioned insurance via Latent Key/confirmation #/EOC [id number] Status is pending

## 2024-04-23 NOTE — Telephone Encounter (Signed)
 Pharmacy Patient Advocate Encounter  Received notification from [facility] [employer] that Prior Authorization for Mounjaro  2.5MG /0.5ML auto-injectors has been APPROVED from 04/21/2024 to 04/21/2025. Unable to obtain price due to refill too soon rejection, last fill date 04/21/2024 next available fill date 05/14/2024   PA #/Case ID/Reference #: [id number]

## 2024-05-09 ENCOUNTER — Ambulatory Visit (HOSPITAL_COMMUNITY)
Admission: RE | Admit: 2024-05-09 | Discharge: 2024-05-09 | Disposition: A | Source: Ambulatory Visit | Attending: Family Medicine

## 2024-05-09 ENCOUNTER — Ambulatory Visit: Payer: Self-pay | Admitting: Family Medicine

## 2024-05-09 ENCOUNTER — Encounter: Payer: Self-pay | Admitting: Family Medicine

## 2024-05-09 ENCOUNTER — Ambulatory Visit

## 2024-05-09 VITALS — BP 169/90 | HR 70 | Temp 98.1°F | Ht 71.0 in | Wt 307.2 lb

## 2024-05-09 DIAGNOSIS — I10 Essential (primary) hypertension: Secondary | ICD-10-CM

## 2024-05-09 DIAGNOSIS — R1032 Left lower quadrant pain: Secondary | ICD-10-CM | POA: Diagnosis not present

## 2024-05-09 DIAGNOSIS — M7918 Myalgia, other site: Secondary | ICD-10-CM

## 2024-05-09 DIAGNOSIS — R5381 Other malaise: Secondary | ICD-10-CM | POA: Diagnosis not present

## 2024-05-09 DIAGNOSIS — R5383 Other fatigue: Secondary | ICD-10-CM

## 2024-05-09 DIAGNOSIS — R10A2 Flank pain, left side: Secondary | ICD-10-CM

## 2024-05-09 DIAGNOSIS — N529 Male erectile dysfunction, unspecified: Secondary | ICD-10-CM

## 2024-05-09 DIAGNOSIS — M2550 Pain in unspecified joint: Secondary | ICD-10-CM

## 2024-05-09 DIAGNOSIS — Z87442 Personal history of urinary calculi: Secondary | ICD-10-CM

## 2024-05-09 LAB — URINALYSIS, ROUTINE W REFLEX MICROSCOPIC
Bilirubin, UA: NEGATIVE
Glucose, UA: NEGATIVE
Ketones, UA: NEGATIVE
Leukocytes,UA: NEGATIVE
Nitrite, UA: NEGATIVE
Protein,UA: NEGATIVE
RBC, UA: NEGATIVE
Specific Gravity, UA: 1.02 (ref 1.005–1.030)
Urobilinogen, Ur: 0.2 mg/dL (ref 0.2–1.0)
pH, UA: 6 (ref 5.0–7.5)

## 2024-05-09 NOTE — Progress Notes (Signed)
 "  Acute Office Visit  Subjective:     Patient ID: Ernest Miller, male    DOB: 29-Jul-1975, 49 y.o.   MRN: [id number]  Chief Complaint  Patient presents with   Fatigue    HPI  History of Present Illness   Ernest Miller is a 49 year old male with type 2 diabetes who presents with fatigue and joint pain.  Fatigue and decreased energy - Significant fatigue and lack of energy for approximately one week - Improved sleep duration due to fatigue, but experiences an active mind during sleep with thoughts about work and personal responsibilities - No fever or chills - Legs often feel heavy, making ambulation difficult - Concerned about persistent low energy levels  Polyarticular joint pain - Sporadic joint pain affecting knees, elbows, ankles, wrists, and hands - Pain is sudden, sharp, and needle-like, lasting 5-10 seconds before resolving - Episodes can occur multiple times daily and fluctuate - No significant swelling or tenderness in affected joints  Muscle cramps - Severe muscle cramps in thighs, calves, and feet - Cramps last up to 30 minutes - Intensity of cramps described as severe, with one episode perceived as life-threatening  Left flank pain and nephrolithiasis - History of kidney stones - Recent left flank pain similar to previous kidney stone episodes - Pain radiates to left lower abdomen - Hydrocodone  provided pain relief last night - No dysuria or increased urinary frequency - Persistent nausea, attributed to baseline state and possible weight gain  Type 2 diabetes mellitus and medication intolerance - Type 2 diabetes mellitus - Previously prescribed low dose antihypertensive for renal protection, discontinued due to severe sore throat and dry cough - Currently taking Mounjaro , associated with increased nausea  Testicular pain and rash - Occasional testicular pain with radiation - Associated with a small rash - History of 'achy ball syndrome' diagnosis        ROS As per HPI.      Objective:    BP (!) 169/90   Pulse 70   Temp 98.1 F (36.7 C) (Temporal)   Ht 5' 11 (1.803 m)   Wt (!) 307 lb 3.2 oz (139.3 kg)   SpO2 99%   BMI 42.85 kg/m    Physical Exam Vitals and nursing note reviewed.  Constitutional:      General: He is not in acute distress.    Appearance: He is obese. He is not ill-appearing, toxic-appearing or diaphoretic.  Cardiovascular:     Rate and Rhythm: Normal rate and regular rhythm.     Heart sounds: No murmur heard. Pulmonary:     Effort: Pulmonary effort is normal. No respiratory distress.     Breath sounds: Normal breath sounds. No wheezing, rhonchi or rales.  Abdominal:     General: Bowel sounds are normal. There is no distension.     Tenderness: There is no abdominal tenderness. There is left CVA tenderness. There is no guarding or rebound.  Musculoskeletal:     Right lower leg: No edema.     Left lower leg: No edema.  Skin:    General: Skin is warm and dry.  Neurological:     General: No focal deficit present.     Mental Status: He is alert.  Psychiatric:        Mood and Affect: Mood normal.        Behavior: Behavior normal.     No results found for any visits on 05/09/24.      Assessment & Plan:  [name] was seen today for fatigue.  Diagnoses and all orders for this visit:  Left flank pain -     Urinalysis, Routine w reflex microscopic -     CMP14+EGFR -     Anemia Profile B -     Cancel: CT RENAL STONE STUDY; Future -     CT RENAL STONE STUDY; Future  Left lower quadrant abdominal pain -     Cancel: CT RENAL STONE STUDY; Future -     CT RENAL STONE STUDY; Future  History of kidney stones -     CT RENAL STONE STUDY; Future  Malaise and fatigue -     Testosterone,Free and Total -     ANA,IFA RA Diag Pnl w/rflx Tit/Patn -     Sedimentation Rate -     C-reactive protein -     CMP14+EGFR -     Anemia Profile B  Myalgia, multiple sites  Erectile dysfunction, unspecified  erectile dysfunction type  Arthralgia of multiple joints  Primary HTN  Assessment and Plan    left flank pain radiating to LLQ, hx of kidney stones Differential includes kidney stone, obstructive stone or infection. Urinalysis normal, CT scan warranted. - Ordered STAT CT scan of the abdomen and pelvis.  Fatigue and arthralgia of multiple joints Fatigue and intermittent arthralgia affecting multiple joints for last week. Differential includes autoimmune disease, infection, or inflammatory conditions. Testosterone deficiency considered but unlikely. - Ordered lab work: testosterone, ANA panel, CRP, sedimentation rate, kidney and liver function tests, anemia profile, iron levels, B12, folate. - Consider referral to rheumatologist if ANA panel positive.  Erectile dysfunction Decreased libido possibly associated with low testosterone levels. Further evaluation needed. - Checked testosterone levels as part of lab work.     Primary HTN BP not at goal. Monitor at home and notify PCP for elevated home readings.    Will determine follow up pending lab and CT results.   The patient indicates understanding of these issues and agrees with the plan.  Annabella CHRISTELLA Search, FNP   "

## 2024-05-11 ENCOUNTER — Telehealth: Payer: Self-pay | Admitting: Family Medicine

## 2024-05-11 LAB — ANEMIA PROFILE B
Basophils Absolute: 0.2 10*3/uL (ref 0.0–0.2)
Basos: 1 %
EOS (ABSOLUTE): 0.8 10*3/uL — ABNORMAL HIGH (ref 0.0–0.4)
Eos: 7 %
Ferritin: 201 ng/mL (ref 30–400)
Folate: 6.7 ng/mL
Hematocrit: 48.2 % (ref 37.5–51.0)
Hemoglobin: 15.7 g/dL (ref 13.0–17.7)
Immature Grans (Abs): 0 10*3/uL (ref 0.0–0.1)
Immature Granulocytes: 0 %
Iron Saturation: 30 (ref 15–55)
Iron: 96 ug/dL (ref 38–169)
Lymphocytes Absolute: 3 10*3/uL (ref 0.7–3.1)
Lymphs: 28 %
MCH: 30.3 pg (ref 26.6–33.0)
MCHC: 32.6 g/dL (ref 31.5–35.7)
MCV: 93 fL (ref 79–97)
Monocytes Absolute: 1 10*3/uL — ABNORMAL HIGH (ref 0.1–0.9)
Monocytes: 9 %
Neutrophils Absolute: 5.7 10*3/uL (ref 1.4–7.0)
Neutrophils: 55 %
Platelets: 285 10*3/uL (ref 150–450)
RBC: 5.18 x10E6/uL (ref 4.14–5.80)
RDW: 12.9 % (ref 11.6–15.4)
Retic Ct Pct: 1.3 % (ref 0.6–2.6)
Total Iron Binding Capacity: 315 ug/dL (ref 250–450)
UIBC: 219 ug/dL (ref 111–343)
Vitamin B-12: 564 pg/mL (ref 232–1245)
WBC: 10.6 10*3/uL (ref 3.4–10.8)

## 2024-05-11 LAB — CMP14+EGFR
ALT: 26 [IU]/L (ref 0–44)
AST: 20 [IU]/L (ref 0–40)
Albumin: 4.5 g/dL (ref 4.1–5.1)
Alkaline Phosphatase: 84 [IU]/L (ref 47–123)
BUN/Creatinine Ratio: 16 (ref 9–20)
BUN: 18 mg/dL (ref 6–24)
Bilirubin Total: 0.3 mg/dL (ref 0.0–1.2)
CO2: 22 mmol/L (ref 20–29)
Calcium: 9.6 mg/dL (ref 8.7–10.2)
Chloride: 101 mmol/L (ref 96–106)
Creatinine, Ser: 1.14 mg/dL (ref 0.76–1.27)
Globulin, Total: 2.7 g/dL (ref 1.5–4.5)
Glucose: 106 mg/dL — AB (ref 70–99)
Potassium: 4.9 mmol/L (ref 3.5–5.2)
Sodium: 139 mmol/L (ref 134–144)
Total Protein: 7.2 g/dL (ref 6.0–8.5)
eGFR: 78 mL/min/{1.73_m2}

## 2024-05-11 LAB — TESTOSTERONE,FREE AND TOTAL
Testosterone, Free: 4.3 pg/mL — AB (ref 7.2–24.0)
Testosterone: 396 ng/dL (ref 264–916)

## 2024-05-11 LAB — SEDIMENTATION RATE: Sed Rate: 4 mm/h (ref 0–30)

## 2024-05-11 LAB — C-REACTIVE PROTEIN: CRP: 2 mg/L (ref 0–10)

## 2024-05-11 LAB — ANA,IFA RA DIAG PNL W/RFLX TIT/PATN: Rheumatoid fact SerPl-aCnc: <10 [IU]/mL

## 2024-05-11 NOTE — Telephone Encounter (Signed)
 Copied from CRM #[id number]. Topic: Clinical - Lab/Test Results >> May 10, 2024  5:50 PM Tobias L wrote: Reason for CRM: Patient returning call for CT results.   Best callback number: [phone]

## 2024-05-11 NOTE — Telephone Encounter (Signed)
Called and spoke with patient he is aware.

## 2024-05-25 ENCOUNTER — Ambulatory Visit: Admitting: Family
